# Patient Record
Sex: Male | Born: 1955 | Race: Black or African American | Hispanic: No | Marital: Single | Smoking: Current every day smoker
Health system: Southern US, Community
[De-identification: ages and names within clinical notes are randomized; demographics above are authoritative.]

## PROBLEM LIST (undated history)

## (undated) DIAGNOSIS — J45909 Unspecified asthma, uncomplicated: Secondary | ICD-10-CM

## (undated) DIAGNOSIS — F329 Major depressive disorder, single episode, unspecified: Secondary | ICD-10-CM

## (undated) DIAGNOSIS — I499 Cardiac arrhythmia, unspecified: Secondary | ICD-10-CM

## (undated) DIAGNOSIS — E119 Type 2 diabetes mellitus without complications: Secondary | ICD-10-CM

## (undated) DIAGNOSIS — I209 Angina pectoris, unspecified: Secondary | ICD-10-CM

## (undated) DIAGNOSIS — R011 Cardiac murmur, unspecified: Secondary | ICD-10-CM

## (undated) DIAGNOSIS — K219 Gastro-esophageal reflux disease without esophagitis: Secondary | ICD-10-CM

## (undated) DIAGNOSIS — M549 Dorsalgia, unspecified: Secondary | ICD-10-CM

## (undated) DIAGNOSIS — J189 Pneumonia, unspecified organism: Secondary | ICD-10-CM

## (undated) DIAGNOSIS — F419 Anxiety disorder, unspecified: Secondary | ICD-10-CM

## (undated) DIAGNOSIS — G8929 Other chronic pain: Secondary | ICD-10-CM

## (undated) DIAGNOSIS — F32A Depression, unspecified: Secondary | ICD-10-CM

## (undated) HISTORY — PX: EYE SURGERY: SHX253

## (undated) HISTORY — PX: TESTICLE SURGERY: SHX794

## (undated) HISTORY — PX: TONSILLECTOMY: SUR1361

---

## 2012-11-22 ENCOUNTER — Encounter (HOSPITAL_COMMUNITY): Payer: Self-pay | Admitting: Emergency Medicine

## 2012-11-22 ENCOUNTER — Emergency Department (HOSPITAL_COMMUNITY)
Admission: EM | Admit: 2012-11-22 | Discharge: 2012-11-22 | Disposition: A | Discharge status: Home / Self Care | Payer: Medicare Other | Attending: Emergency Medicine | Admitting: Emergency Medicine

## 2012-11-22 ENCOUNTER — Emergency Department (HOSPITAL_COMMUNITY): Payer: Medicare Other

## 2012-11-22 DIAGNOSIS — F17200 Nicotine dependence, unspecified, uncomplicated: Secondary | ICD-10-CM | POA: Insufficient documentation

## 2012-11-22 DIAGNOSIS — R51 Headache: Secondary | ICD-10-CM | POA: Insufficient documentation

## 2012-11-22 DIAGNOSIS — E119 Type 2 diabetes mellitus without complications: Secondary | ICD-10-CM | POA: Insufficient documentation

## 2012-11-22 DIAGNOSIS — R079 Chest pain, unspecified: Secondary | ICD-10-CM | POA: Insufficient documentation

## 2012-11-22 DIAGNOSIS — R0602 Shortness of breath: Secondary | ICD-10-CM | POA: Insufficient documentation

## 2012-11-22 HISTORY — DX: Type 2 diabetes mellitus without complications: E11.9

## 2012-11-22 LAB — BASIC METABOLIC PANEL
BUN: 15 mg/dL (ref 6–23)
CO2: 26 mEq/L (ref 19–32)
Calcium: 9.4 mg/dL (ref 8.4–10.5)
Chloride: 100 mEq/L (ref 96–112)
Creatinine, Ser: 1.01 mg/dL (ref 0.50–1.35)
GFR calc Af Amer: >90 mL/min (ref >90)
GFR calc non Af Amer: 81 mL/min — ABNORMAL LOW (ref >90)
Glucose, Bld: 272 mg/dL — ABNORMAL HIGH (ref 70–99)
Potassium: 4.2 mEq/L (ref 3.5–5.1)
Sodium: 136 mEq/L (ref 135–145)

## 2012-11-22 LAB — CBC
HCT: 43.1 % (ref 39.0–52.0)
Hemoglobin: 15.9 g/dL (ref 13.0–17.0)
MCH: 28.2 pg (ref 26.0–34.0)
MCHC: 36.9 g/dL — ABNORMAL HIGH (ref 30.0–36.0)
MCV: 76.6 fL — ABNORMAL LOW (ref 78.0–100.0)
Platelets: 146 10*3/uL — ABNORMAL LOW (ref 150–400)
RBC: 5.63 MIL/uL (ref 4.22–5.81)
RDW: 13.4 % (ref 11.5–15.5)
WBC: 7.5 10*3/uL (ref 4.0–10.5)

## 2012-11-22 LAB — POCT I-STAT TROPONIN I
Troponin i, poc: 0.01 ng/mL (ref 0.00–0.08)
Troponin i, poc: 0.02 ng/mL (ref 0.00–0.08)

## 2012-11-22 LAB — PRO B NATRIURETIC PEPTIDE: Pro B Natriuretic peptide (BNP): 11.3 pg/mL (ref 0–125)

## 2012-11-22 NOTE — ED Provider Notes (Signed)
  Face-to-face evaluation   History:  He complains of chest and headache for 2 days. It is intermittent. He has had previous episodes like this, and they were evaluated with stress test. He, states that he has had 2 negative stress tests in the last 5 years. He admits to stress at home regarding his 3 stepchildren. He does not currently have a PCP here. He recently moved to [place].  Physical exam: Obese, alert, calm, cooperative. He is not in any distress. There is no respiratory distress. Neurologic exam is grossly nonfocal.  Medical screening examination/treatment/procedure(s) were conducted as a shared visit with non-physician practitioner(s) and myself.  I personally evaluated the patient during the encounter  Flint Melter, MD 11/23/12 [phone]

## 2012-11-22 NOTE — ED Notes (Signed)
The pt has had some rt sided chest pain for 24 hours with dizziness son none now. Nausea.  He took cialis 1000 am yesterday

## 2012-11-22 NOTE — ED Provider Notes (Signed)
CSN: [id number]     Arrival date & time 11/22/12  1730 History   First MD Initiated Contact with Patient 11/22/12 1855     Chief Complaint  Patient presents with  . Chest Pain   (Consider location/radiation/quality/duration/timing/severity/associated sxs/prior Treatment) HPI Comments: Patient presents emergency department with chief complaint of chest pain that started last night. He reports associated headache and shortness of breath. States that he has diabetes, and smokes. He has had chest pain in the past, and has not be admitted before in [facility], but has never had a heart attack. He states that his pain has subsided after receiving aspirin by EMS. States that the chest pain and shortness of breath is worsened when he becomes angry. He denies any other symptoms at this time. No nausea, vomiting, diarrhea, or constipation. Took cialis yesterday at 10:00 am.  The history is provided by the patient. No language interpreter was used.    Past Medical History  Diagnosis Date  . Diabetes mellitus without complication    History reviewed. No pertinent past surgical history. No family history on file. History  Substance Use Topics  . Smoking status: Current Every Day Smoker  . Smokeless tobacco: Not on file  . Alcohol Use: No    Review of Systems  All other systems reviewed and are negative.    Allergies  Review of patient's allergies indicates no known allergies.  Home Medications  No current outpatient prescriptions on file. BP 142/79  Pulse 87  Temp(Src) 99 F (37.2 C) (Oral)  Resp 18  Ht 6\' 1"  (1.854 m)  Wt 315 lb (142.883 kg)  BMI 41.57 kg/m2  SpO2 96% Physical Exam  Nursing note and vitals reviewed. Constitutional: He is oriented to person, place, and time. He appears well-developed and well-nourished.  HENT:  Head: Normocephalic and atraumatic.  Right Ear: External ear normal.  Left Ear: External ear normal.  Nose: Nose normal.  Mouth/Throat: Oropharynx is  clear and moist. No oropharyngeal exudate.  Eyes: Conjunctivae and EOM are normal. Pupils are equal, round, and reactive to light. Right eye exhibits no discharge. Left eye exhibits no discharge. No scleral icterus.  Neck: Normal range of motion. Neck supple. No JVD present.  Cardiovascular: Normal rate, regular rhythm, normal heart sounds and intact distal pulses.  Exam reveals no gallop and no friction rub.   No murmur heard. Pulmonary/Chest: Effort normal and breath sounds normal. No respiratory distress. He has no wheezes. He has no rales. He exhibits no tenderness.  Abdominal: Soft. He exhibits no distension and no mass. There is no tenderness. There is no rebound and no guarding.  Musculoskeletal: Normal range of motion. He exhibits no edema and no tenderness.  Neurological: He is alert and oriented to person, place, and time. He has normal reflexes.  CN 3-12 intact  Skin: Skin is warm and dry.  Psychiatric: He has a normal mood and affect. His behavior is normal. Judgment and thought content normal.    ED Course  Procedures (including critical care time) Results for orders placed during the hospital encounter of 11/22/12  CBC      Result Value Range   WBC 7.5  4.0 - 10.5 K/uL   RBC 5.63  4.22 - 5.81 MIL/uL   Hemoglobin 15.9  13.0 - 17.0 g/dL   HCT 30.8  65.7 - 84.6 %   MCV 76.6 (*) 78.0 - 100.0 fL   MCH 28.2  26.0 - 34.0 pg   MCHC 36.9 (*) 30.0 -  36.0 g/dL   RDW 45.4  09.8 - 11.9 %   Platelets 146 (*) 150 - 400 K/uL  BASIC METABOLIC PANEL      Result Value Range   Sodium 136  135 - 145 mEq/L   Potassium 4.2  3.5 - 5.1 mEq/L   Chloride 100  96 - 112 mEq/L   CO2 26  19 - 32 mEq/L   Glucose, Bld 272 (*) 70 - 99 mg/dL   BUN 15  6 - 23 mg/dL   Creatinine, Ser 1.47  0.50 - 1.35 mg/dL   Calcium 9.4  8.4 - 82.9 mg/dL   GFR calc non Af Amer 81 (*) >90 mL/min   GFR calc Af Amer >90  >90 mL/min  PRO B NATRIURETIC PEPTIDE      Result Value Range   Pro B Natriuretic peptide (BNP)  11.3  0 - 125 pg/mL  POCT I-STAT TROPONIN I      Result Value Range   Troponin i, poc 0.01  0.00 - 0.08 ng/mL   Comment 3           POCT I-STAT TROPONIN I      Result Value Range   Troponin i, poc 0.02  0.00 - 0.08 ng/mL   Comment 3            Dg Chest 2 View  11/22/2012   CLINICAL DATA:  Chest pain  EXAM: CHEST  2 VIEW  COMPARISON:  None.  FINDINGS: The cardiac and mediastinal silhouettes are within normal limits.  The lungs are normally inflated. No airspace consolidation, pleural effusion, or pulmonary edema is identified. There is no pneumothorax.  No acute osseous abnormality identified. Multilevel degenerative changes are noted within the visualized spine.  IMPRESSION: No active cardiopulmonary disease.   Electronically Signed   By: Rise Mu M.D.   On: 11/22/2012 20:30      EKG Interpretation     Ventricular Rate:  90 PR Interval:  136 QRS Duration: 136 QT Interval:  368 QTC Calculation: 450 R Axis:   23 Text Interpretation:  Normal sinus rhythm Indeterminate axis Right bundle branch block Abnormal ECG No old tracing to compare            MDM   1. Chest pain     Patient with intermittent chest pain.  HEART score is 2.  Low risk to go home.  Trop x2 is negative.    Patient seen by and discussed with Dr. Effie Shy, who agrees that the patient can be discharged to home.  Patient is stable and ready for discharge.  Will have patient follow up [facility].    Roxy Horseman, PA-C 11/22/12 2255

## 2012-11-22 NOTE — ED Notes (Signed)
ems brought the pt in and they gave the pt 324mg  aspirin.  Iv per ems

## 2012-11-22 NOTE — ED Notes (Signed)
No answer in waiting area.

## 2012-11-22 NOTE — ED Notes (Signed)
Was able to draw i stat but was not able to draw rest of labs due to pt moving and pulling needle out of hand. Pt stated that I only had that one time and that was it. Pt placed in waiting room.

## 2013-01-09 DIAGNOSIS — M7989 Other specified soft tissue disorders: Secondary | ICD-10-CM

## 2013-01-10 ENCOUNTER — Emergency Department (HOSPITAL_COMMUNITY)
Admission: EM | Admit: 2013-01-10 | Discharge: 2013-01-10 | Disposition: A | Discharge status: Home / Self Care | Payer: Medicare Other | Attending: Emergency Medicine | Admitting: Emergency Medicine

## 2013-01-10 ENCOUNTER — Emergency Department (HOSPITAL_COMMUNITY): Payer: Medicare Other

## 2013-01-10 ENCOUNTER — Encounter (HOSPITAL_COMMUNITY): Payer: Self-pay | Admitting: Emergency Medicine

## 2013-01-10 DIAGNOSIS — Z79899 Other long term (current) drug therapy: Secondary | ICD-10-CM | POA: Insufficient documentation

## 2013-01-10 DIAGNOSIS — R059 Cough, unspecified: Secondary | ICD-10-CM | POA: Insufficient documentation

## 2013-01-10 DIAGNOSIS — R05 Cough: Secondary | ICD-10-CM | POA: Insufficient documentation

## 2013-01-10 DIAGNOSIS — K8050 Calculus of bile duct without cholangitis or cholecystitis without obstruction: Secondary | ICD-10-CM

## 2013-01-10 DIAGNOSIS — K8020 Calculus of gallbladder without cholecystitis without obstruction: Secondary | ICD-10-CM

## 2013-01-10 DIAGNOSIS — E669 Obesity, unspecified: Secondary | ICD-10-CM | POA: Insufficient documentation

## 2013-01-10 DIAGNOSIS — F17200 Nicotine dependence, unspecified, uncomplicated: Secondary | ICD-10-CM | POA: Insufficient documentation

## 2013-01-10 DIAGNOSIS — E119 Type 2 diabetes mellitus without complications: Secondary | ICD-10-CM | POA: Insufficient documentation

## 2013-01-10 LAB — COMPREHENSIVE METABOLIC PANEL
ALT: 30 U/L (ref 0–53)
AST: 21 U/L (ref 0–37)
Albumin: 3.3 g/dL — ABNORMAL LOW (ref 3.5–5.2)
Alkaline Phosphatase: 88 U/L (ref 39–117)
BUN: 12 mg/dL (ref 6–23)
CO2: 26 mEq/L (ref 19–32)
Calcium: 9.5 mg/dL (ref 8.4–10.5)
Chloride: 98 mEq/L (ref 96–112)
Creatinine, Ser: 0.89 mg/dL (ref 0.50–1.35)
GFR calc Af Amer: >90 mL/min (ref >90)
GFR calc non Af Amer: >90 mL/min (ref >90)
Glucose, Bld: 315 mg/dL — ABNORMAL HIGH (ref 70–99)
Potassium: 4.2 mEq/L (ref 3.5–5.1)
Sodium: 133 mEq/L — ABNORMAL LOW (ref 135–145)
Total Bilirubin: 0.2 mg/dL — ABNORMAL LOW (ref 0.3–1.2)
Total Protein: 6.8 g/dL (ref 6.0–8.3)

## 2013-01-10 LAB — CBC WITH DIFFERENTIAL/PLATELET
Basophils Absolute: 0 10*3/uL (ref 0.0–0.1)
Basophils Relative: 0 % (ref 0–1)
Eosinophils Absolute: 0.1 10*3/uL (ref 0.0–0.7)
Eosinophils Relative: 1 % (ref 0–5)
HCT: 41.6 % (ref 39.0–52.0)
Hemoglobin: 15 g/dL (ref 13.0–17.0)
Lymphocytes Relative: 41 % (ref 12–46)
Lymphs Abs: 2.5 10*3/uL (ref 0.7–4.0)
MCH: 27.4 pg (ref 26.0–34.0)
MCHC: 36.1 g/dL — ABNORMAL HIGH (ref 30.0–36.0)
MCV: 76.1 fL — ABNORMAL LOW (ref 78.0–100.0)
Monocytes Absolute: 0.4 10*3/uL (ref 0.1–1.0)
Monocytes Relative: 6 % (ref 3–12)
Neutro Abs: 3.1 10*3/uL (ref 1.7–7.7)
Neutrophils Relative %: 51 % (ref 43–77)
Platelets: 146 10*3/uL — ABNORMAL LOW (ref 150–400)
RBC: 5.47 MIL/uL (ref 4.22–5.81)
RDW: 13.1 % (ref 11.5–15.5)
WBC: 6 10*3/uL (ref 4.0–10.5)

## 2013-01-10 LAB — D-DIMER, QUANTITATIVE: D-Dimer, Quant: 0.48 ug/mL-FEU (ref 0.00–0.48)

## 2013-01-10 LAB — LIPASE, BLOOD: Lipase: 49 U/L (ref 11–59)

## 2013-01-10 LAB — URINALYSIS, ROUTINE W REFLEX MICROSCOPIC
Bilirubin Urine: NEGATIVE
Glucose, UA: >1000 mg/dL — AB
Hgb urine dipstick: NEGATIVE
Ketones, ur: NEGATIVE mg/dL
Leukocytes, UA: NEGATIVE
Nitrite: NEGATIVE
Protein, ur: NEGATIVE mg/dL
Specific Gravity, Urine: 1.025 (ref 1.005–1.030)
Urobilinogen, UA: 1 mg/dL (ref 0.0–1.0)
pH: 6 (ref 5.0–8.0)

## 2013-01-10 LAB — URINE MICROSCOPIC-ADD ON

## 2013-01-10 LAB — MAGNESIUM: Magnesium: 1.8 mg/dL (ref 1.5–2.5)

## 2013-01-10 LAB — TROPONIN I: Troponin I: <0.3 ng/mL (ref <0.30)

## 2013-01-10 MED ORDER — OXYCODONE-ACETAMINOPHEN 5-325 MG PO TABS: 1.0000 | ORAL_TABLET | Freq: Four times a day (QID) | ORAL | Status: DC | PRN

## 2013-01-10 NOTE — ED Provider Notes (Signed)
CSN: [id number]     Arrival date & time 01/10/13  4098 History   First MD Initiated Contact with Patient 01/10/13 0805     Chief Complaint  Patient presents with  . Abdominal Pain   (Consider location/radiation/quality/duration/timing/severity/associated sxs/prior Treatment) HPI Pt is a 57yo male with hx of DM c/o 5 day hx of RUQ pain and cough.  When asked to describe pain, aching, throbbing, sharp, pt stated "all of the above."  Pain is worse with coughing, sneezing or "pressure" Pain currently 3/10, but 10/10 at worst.  Has been taking flexeril and percocet which did provide "slight" relief yesterday.  Pt states cough has been intermittent and usually dry but occasionally coughs up small amounts of "black stuff."  Denies bright red blood. Pt is a smoking but states he is trying to cut back by using a vapor hooka.  Pt also mentions he is concerned he may have a blood clot as she did have a bruise to the back of R knee 1 week ago but it has not resolved.  Denies fever, n/v/d. Denies chest pain or SOB. Denies recent sick contacts or travel.  No hx of renal stones or pancreatitis. No hx of blood clots. No recent travel or sick contacts. Denies trauma. Denies hx of abdominal surgeries but does state his mother has hx of gallstones.   Past Medical History  Diagnosis Date  . Diabetes mellitus without complication    Past Surgical History  Procedure Laterality Date  . Tonsillectomy     History reviewed. No pertinent family history. History  Substance Use Topics  . Smoking status: Current Every Day Smoker -- 0.25 packs/day    Types: Cigarettes  . Smokeless tobacco: Never Used  . Alcohol Use: No    Review of Systems  Constitutional: Negative for fever, chills and appetite change.  HENT: Negative for congestion.   Respiratory: Positive for cough. Negative for shortness of breath.   Cardiovascular: Negative for chest pain.  Gastrointestinal: Positive for abdominal pain ( RUQ). Negative for  nausea, vomiting, diarrhea and constipation.  Genitourinary: Negative for dysuria, frequency and hematuria.  Musculoskeletal: Negative for back pain.  All other systems reviewed and are negative.    Allergies  Review of patient's allergies indicates no known allergies.  Home Medications   Current Outpatient Rx  Name  Route  Sig  Dispense  Refill  . esomeprazole (NEXIUM) 20 MG capsule   Oral   Take 20 mg by mouth daily as needed (for stomach / heartburn).         Marland Kitchen glipiZIDE (GLUCOTROL XL) 5 MG 24 hr tablet   Oral   Take 5 mg by mouth daily with breakfast.         . oxyCODONE-acetaminophen (PERCOCET/ROXICET) 5-325 MG per tablet   Oral   Take 1-2 tablets by mouth every 6 (six) hours as needed for moderate pain or severe pain.   10 tablet   0    BP 123/109  Pulse 78  Temp(Src) 97.9 F (36.6 C) (Oral)  Resp 20  SpO2 97% Physical Exam  Nursing note and vitals reviewed. Constitutional: He appears well-developed and well-nourished.  Obese male lying in exam bed, NAD.  HENT:  Head: Normocephalic and atraumatic.  Eyes: Conjunctivae are normal. No scleral icterus.  Neck: Normal range of motion.  Cardiovascular: Normal rate, regular rhythm and normal heart sounds.   Pulmonary/Chest: Effort normal and breath sounds normal. No respiratory distress. He has no wheezes. He has no rales. He  exhibits tenderness ( right lower anterior ribs).  No respiratory distress, able to speak in full sentences w/o difficulty. Lungs: CTAB  Abdominal: Soft. Bowel sounds are normal. He exhibits no distension and no mass. There is tenderness ( RUQ). There is guarding. There is no rebound and no CVA tenderness.    Obese abdomen, soft, tenderness in RUQ with some guarding.  Musculoskeletal: Normal range of motion.  Neurological: He is alert.  Skin: Skin is warm and dry. No rash noted. No erythema.    ED Course  Procedures (including critical care time) Labs Review Labs Reviewed  CBC WITH  DIFFERENTIAL - Abnormal; Notable for the following:    MCV 76.1 (*)    MCHC 36.1 (*)    Platelets 146 (*)    All other components within normal limits  COMPREHENSIVE METABOLIC PANEL - Abnormal; Notable for the following:    Sodium 133 (*)    Glucose, Bld 315 (*)    Albumin 3.3 (*)    Total Bilirubin 0.2 (*)    All other components within normal limits  URINALYSIS, ROUTINE W REFLEX MICROSCOPIC - Abnormal; Notable for the following:    Glucose, UA >1000 (*)    All other components within normal limits  URINE MICROSCOPIC-ADD ON - Abnormal; Notable for the following:    Squamous Epithelial / LPF FEW (*)    All other components within normal limits  LIPASE, BLOOD  D-DIMER, QUANTITATIVE  TROPONIN I  MAGNESIUM   Imaging Review Dg Chest 2 View  01/10/2013   CLINICAL DATA:  Right lower chest pain.  EXAM: CHEST  2 VIEW  COMPARISON:  11/22/2012  FINDINGS: The lungs are clear without focal infiltrate, edema, pneumothorax or pleural effusion. Interstitial markings are diffusely coarsened with chronic features. Cardiopericardial silhouette is at upper limits of normal for size. Imaged bony structures of the thorax are intact. Telemetry leads overlie the chest.  IMPRESSION: Stable.  No acute cardiopulmonary findings.   Electronically Signed   By: Kennith Center M.D.   On: 01/10/2013 09:24    EKG Interpretation    Date/Time:  Wednesday January 10 2013 08:13:44 EST Ventricular Rate:  90 PR Interval:  149 QRS Duration: 140 QT Interval:  375 QTC Calculation: 459 R Axis:   -57 Text Interpretation:  Sinus rhythm RBBB and LAFB No significant change was found Confirmed by Manus Gunning  MD, STEPHEN ([id number]) on 01/10/2013 8:18:35 AM            MDM   1. Biliary colic   2. Cholelithiasis    Pt is an obese male with hx of DM c/o right lower rib pain/RUQ pain that started 5 days ago. DDx: cholecystitis, pneumonia, PE (low suspicion), ACS (low suspicion).  Pt states his mother had gallstones and he is  concerned for PE due to having bruise behind right knee last week. Pt appears well, vitals: unremarkable.  Pt declined pain and nausea medication upon arrival.  On exam:  NAD, no respiratory distress, chest tenderness over right lower ribs, Abd-obese, soft, tender in RUQ with guarding.  No erythema or ecchymosis.  Discussed pt with Dr. Manus Gunning who also examined pt.  Will get labs: CBC, CMP, Lipase, Troponin, D-dimer, and UA.  CXR and abdominal U/S  9:46am: pt had a run of 8 beats of Vtach, pt was asymptomatic at the time. Nurse checked on pt who states he was bending down to put his shorts back on, NAD.  Will add magnesium lab to orders.  Abd U/S: gallbladder filled  with echogenic sludge as well as a stone, 2cm in diameter. No gallbladder wall thickening.  Mild tenderness over gallbladder, no classic Murphy's sign.    Pt has symptomatic cholelithiasis. Will provide contact information for f/u with [facility] for consultation of biliary colic.   All labs/imaging/findings discussed with patient. All questions answered and concerns addressed. Will discharge pt home and have pt f/u with [facility] info provided. Return precautions given. Pt verbalized understanding and agreement with tx plan. Vitals: unremarkable. Discharged in stable condition.    Discussed pt with Dr. Manus Gunning during ED encounter and agrees with plan.     Junius Finner, PA-C 01/10/13 [phone]

## 2013-01-10 NOTE — ED Notes (Signed)
Off floor for testing 

## 2013-01-10 NOTE — ED Notes (Signed)
Patient had a run of V-Tach-patient was bending over picking up pants-PA notified-patient voicing no complaints-will continue to monitor

## 2013-01-10 NOTE — ED Provider Notes (Signed)
Medical screening examination/treatment/procedure(s) were conducted as a shared visit with non-physician practitioner(s) and myself.  I personally evaluated the patient during the encounter.  5 day history of RUQ and lower rib pain, worse with coughing and movement and eating. No SOB or Vomiting TTP RUQ.  CTAB, RRR EKG unchanged.  Troponin negative, d-dimer negative. Gallstones on [place].   Reported "V tach" on monitor appears to be artifact as patient was standing up and putting pants on and asymptomatic.  EKG Interpretation    Date/Time:  Wednesday January 10 2013 08:13:44 EST Ventricular Rate:  90 PR Interval:  149 QRS Duration: 140 QT Interval:  375 QTC Calculation: 459 R Axis:   -57 Text Interpretation:  Sinus rhythm RBBB and LAFB No significant change was found Confirmed by Manus Gunning  MD, Daylan Juhnke ([id number]) on 01/10/2013 8:18:35 AM             Glynn Octave, MD 01/10/13 4401

## 2013-01-10 NOTE — ED Notes (Addendum)
Pt c/o RUQ pain x 5 days.  Pain score 9/10, increased with coughing.  Denies n/v/d.  Pt sts "I've taken old pain medication and muscle relaxers w/o relief."  Pt sts he had a bruise to the back of R knee x 1 week ago that has healed, but he's worried that it caused a blood clot.

## 2013-01-10 NOTE — Progress Notes (Signed)
Right lower extremity venous duplex completed.  Right:  No evidence of DVT, superficial thrombosis, or Baker's cyst.  Left:  Negative for DVT in the common femoral vein.  

## 2013-01-23 DIAGNOSIS — K8020 Calculus of gallbladder without cholecystitis without obstruction: Secondary | ICD-10-CM | POA: Diagnosis not present

## 2013-03-07 ENCOUNTER — Encounter (HOSPITAL_COMMUNITY): Payer: Self-pay | Admitting: Emergency Medicine

## 2013-03-07 ENCOUNTER — Emergency Department (HOSPITAL_COMMUNITY)
Admission: EM | Admit: 2013-03-07 | Discharge: 2013-03-07 | Disposition: A | Discharge status: Home / Self Care | Payer: Medicare Other | Attending: Emergency Medicine | Admitting: Emergency Medicine

## 2013-03-07 ENCOUNTER — Emergency Department (HOSPITAL_COMMUNITY): Payer: Medicare Other

## 2013-03-07 DIAGNOSIS — K7689 Other specified diseases of liver: Secondary | ICD-10-CM | POA: Diagnosis not present

## 2013-03-07 DIAGNOSIS — F17200 Nicotine dependence, unspecified, uncomplicated: Secondary | ICD-10-CM | POA: Insufficient documentation

## 2013-03-07 DIAGNOSIS — R5381 Other malaise: Secondary | ICD-10-CM | POA: Insufficient documentation

## 2013-03-07 DIAGNOSIS — E669 Obesity, unspecified: Secondary | ICD-10-CM | POA: Insufficient documentation

## 2013-03-07 DIAGNOSIS — E119 Type 2 diabetes mellitus without complications: Secondary | ICD-10-CM | POA: Insufficient documentation

## 2013-03-07 DIAGNOSIS — K8050 Calculus of bile duct without cholangitis or cholecystitis without obstruction: Secondary | ICD-10-CM

## 2013-03-07 DIAGNOSIS — R5383 Other fatigue: Secondary | ICD-10-CM

## 2013-03-07 DIAGNOSIS — R059 Cough, unspecified: Secondary | ICD-10-CM | POA: Insufficient documentation

## 2013-03-07 DIAGNOSIS — R05 Cough: Secondary | ICD-10-CM | POA: Insufficient documentation

## 2013-03-07 DIAGNOSIS — Z79899 Other long term (current) drug therapy: Secondary | ICD-10-CM | POA: Insufficient documentation

## 2013-03-07 DIAGNOSIS — K8020 Calculus of gallbladder without cholecystitis without obstruction: Secondary | ICD-10-CM | POA: Insufficient documentation

## 2013-03-07 LAB — URINALYSIS, ROUTINE W REFLEX MICROSCOPIC
Bilirubin Urine: NEGATIVE
Glucose, UA: >1000 mg/dL — AB
Hgb urine dipstick: NEGATIVE
Ketones, ur: NEGATIVE mg/dL
Leukocytes, UA: NEGATIVE
Nitrite: NEGATIVE
Protein, ur: NEGATIVE mg/dL
Specific Gravity, Urine: 1.027 (ref 1.005–1.030)
Urobilinogen, UA: 1 mg/dL (ref 0.0–1.0)
pH: 5.5 (ref 5.0–8.0)

## 2013-03-07 LAB — URINE MICROSCOPIC-ADD ON

## 2013-03-07 LAB — COMPREHENSIVE METABOLIC PANEL
ALT: 24 U/L (ref 0–53)
AST: 13 U/L (ref 0–37)
Albumin: 3.5 g/dL (ref 3.5–5.2)
Alkaline Phosphatase: 89 U/L (ref 39–117)
BUN: 9 mg/dL (ref 6–23)
CO2: 25 mEq/L (ref 19–32)
Calcium: 9.3 mg/dL (ref 8.4–10.5)
Chloride: 99 mEq/L (ref 96–112)
Creatinine, Ser: 0.8 mg/dL (ref 0.50–1.35)
GFR calc Af Amer: >90 mL/min (ref >90)
GFR calc non Af Amer: >90 mL/min (ref >90)
Glucose, Bld: 302 mg/dL — ABNORMAL HIGH (ref 70–99)
Potassium: 4.4 mEq/L (ref 3.7–5.3)
Sodium: 135 mEq/L — ABNORMAL LOW (ref 137–147)
Total Bilirubin: 0.3 mg/dL (ref 0.3–1.2)
Total Protein: 6.9 g/dL (ref 6.0–8.3)

## 2013-03-07 LAB — CBC WITH DIFFERENTIAL/PLATELET
Basophils Absolute: 0 10*3/uL (ref 0.0–0.1)
Basophils Relative: 0 % (ref 0–1)
Eosinophils Absolute: 0.1 10*3/uL (ref 0.0–0.7)
Eosinophils Relative: 1 % (ref 0–5)
HCT: 42.9 % (ref 39.0–52.0)
Hemoglobin: 15.3 g/dL (ref 13.0–17.0)
Lymphocytes Relative: 46 % (ref 12–46)
Lymphs Abs: 3.1 10*3/uL (ref 0.7–4.0)
MCH: 27 pg (ref 26.0–34.0)
MCHC: 35.7 g/dL (ref 30.0–36.0)
MCV: 75.7 fL — ABNORMAL LOW (ref 78.0–100.0)
Monocytes Absolute: 0.4 10*3/uL (ref 0.1–1.0)
Monocytes Relative: 6 % (ref 3–12)
Neutro Abs: 3.1 10*3/uL (ref 1.7–7.7)
Neutrophils Relative %: 47 % (ref 43–77)
Platelets: 142 10*3/uL — ABNORMAL LOW (ref 150–400)
RBC: 5.67 MIL/uL (ref 4.22–5.81)
RDW: 13.3 % (ref 11.5–15.5)
WBC: 6.7 10*3/uL (ref 4.0–10.5)

## 2013-03-07 LAB — LIPASE, BLOOD: Lipase: 43 U/L (ref 11–59)

## 2013-03-07 MED ORDER — HYDROMORPHONE HCL 2 MG PO TABS: 2.0000 mg | ORAL_TABLET | ORAL | Status: DC | PRN

## 2013-03-07 MED ORDER — HYDROMORPHONE HCL PF 1 MG/ML IJ SOLN
1.0000 mg | Freq: Once | INTRAMUSCULAR | Status: AC
  Administered 2013-03-07: 1 mg via INTRAVENOUS
  Filled 2013-03-07: qty 1

## 2013-03-07 MED ORDER — ONDANSETRON HCL 4 MG/2ML IJ SOLN
4.0000 mg | Freq: Once | INTRAMUSCULAR | Status: AC
  Administered 2013-03-07: 4 mg via INTRAVENOUS
  Filled 2013-03-07: qty 2

## 2013-03-07 MED ORDER — SODIUM CHLORIDE 0.9 % IV BOLUS (SEPSIS)
1000.0000 mL | Freq: Once | INTRAVENOUS | Status: AC
  Administered 2013-03-07: 1000 mL via INTRAVENOUS

## 2013-03-07 NOTE — Discharge Instructions (Signed)
Abdominal Pain, Adult °Many things can cause abdominal pain. Usually, abdominal pain is not caused by a disease and will improve without treatment. It can often be observed and treated at home. Your health care provider will do a physical exam and possibly order blood tests and X-rays to help determine the seriousness of your pain. However, in many cases, more time must pass before a clear cause of the pain can be found. Before that point, your health care provider may not know if you need more testing or further treatment. °HOME CARE INSTRUCTIONS  °Monitor your abdominal pain for any changes. The following actions may help to alleviate any discomfort you are experiencing: °· Only take over-the-counter or prescription medicines as directed by your health care provider. °· Do not take laxatives unless directed to do so by your health care provider. °· Try a clear liquid diet (broth, tea, or water) as directed by your health care provider. Slowly move to a bland diet as tolerated. °SEEK MEDICAL CARE IF: °· You have unexplained abdominal pain. °· You have abdominal pain associated with nausea or diarrhea. °· You have pain when you urinate or have a bowel movement. °· You experience abdominal pain that wakes you in the night. °· You have abdominal pain that is worsened or improved by eating food. °· You have abdominal pain that is worsened with eating fatty foods. °SEEK IMMEDIATE MEDICAL CARE IF:  °· Your pain does not go away within 2 hours. °· You have a fever. °· You keep throwing up (vomiting). °· Your pain is felt only in portions of the abdomen, such as the right side or the left lower portion of the abdomen. °· You pass bloody or black tarry stools. °MAKE SURE YOU: °· Understand these instructions.   °· Will watch your condition.   °· Will get help right away if you are not doing well or get worse.   °Document Released: 10/14/2004 Document Revised: 10/25/2012 Document Reviewed: 09/13/2012 °ExitCare® Patient  Information ©2014 [employer]. ° °Biliary Colic  °Biliary colic is a steady or irregular pain in the upper abdomen. It is usually under the right side of the rib cage. It happens when gallstones interfere with the normal flow of bile from the gallbladder. Bile is a liquid that helps to digest fats. Bile is made in the liver and stored in the gallbladder. When you eat a meal, bile passes from the gallbladder through the cystic duct and the common bile duct into the small intestine. There, it mixes with partially digested food. If a gallstone blocks either of these ducts, the normal flow of bile is blocked. The muscle cells in the bile duct contract forcefully to try to move the stone. This causes the pain of biliary colic.  °SYMPTOMS  °· A person with biliary colic usually complains of pain in the upper abdomen. This pain can be: °· In the center of the upper abdomen just below the breastbone. °· In the upper-right part of the abdomen, near the gallbladder and liver. °· Spread back toward the right shoulder blade. °· Nausea and vomiting. °· The pain usually occurs after eating. °· Biliary colic is usually triggered by the digestive system's demand for bile. The demand for bile is high after fatty meals. Symptoms can also occur when a person who has been fasting suddenly eats a very large meal. Most episodes of biliary colic pass after 1 to 5 hours. After the most intense pain passes, your abdomen may continue to ache mildly for about   24 hours. °DIAGNOSIS  °After you describe your symptoms, your caregiver will perform a physical exam. He or she will pay attention to the upper right portion of your belly (abdomen). This is the area of your liver and gallbladder. An ultrasound will help your caregiver look for gallstones. Specialized scans of the gallbladder may also be done. Blood tests may be done, especially if you have fever or if your pain persists. °PREVENTION  °Biliary colic can be prevented by controlling the  risk factors for gallstones. Some of these risk factors, such as heredity, increasing age, and pregnancy are a normal part of life. Obesity and a high-fat diet are risk factors you can change through a healthy lifestyle. Women going through menopause who take hormone replacement therapy (estrogen) are also more likely to develop biliary colic. °TREATMENT  °· Pain medication may be prescribed. °· You may be encouraged to eat a fat-free diet. °· If the first episode of biliary colic is severe, or episodes of colic keep retuning, surgery to remove the gallbladder (cholecystectomy) is usually recommended. This procedure can be done through small incisions using an instrument called a laparoscope. The procedure often requires a brief stay in the hospital. Some people can leave the hospital the same day. It is the most widely used treatment in people troubled by painful gallstones. It is effective and safe, with no complications in more than 90% of cases. °· If surgery cannot be done, medication that dissolves gallstones may be used. This medication is expensive and can take months or years to work. Only small stones will dissolve. °· Rarely, medication to dissolve gallstones is combined with a procedure called shock-wave lithotripsy. This procedure uses carefully aimed shock waves to break up gallstones. In many people treated with this procedure, gallstones form again within a few years. °PROGNOSIS  °If gallstones block your cystic duct or common bile duct, you are at risk for repeated episodes of biliary colic. There is also a 25% chance that you will develop a gallbladder infection(acute cholecystitis), or some other complication of gallstones within 10 to 20 years. If you have surgery, schedule it at a time that is convenient for you and at a time when you are not sick. °HOME CARE INSTRUCTIONS  °· Drink plenty of clear fluids. °· Avoid fatty, greasy or fried foods, or any foods that make your pain worse. °· Take  medications as directed. °SEEK MEDICAL CARE IF:  °· You develop a fever over 100.5° F (38.1° C). °· Your pain gets worse over time. °· You develop nausea that prevents you from eating and drinking. °· You develop vomiting. °SEEK IMMEDIATE MEDICAL CARE IF:  °· You have continuous or severe belly (abdominal) pain which is not relieved with medications. °· You develop nausea and vomiting which is not relieved with medications. °· You have symptoms of biliary colic and you suddenly develop a fever and shaking chills. This may signal cholecystitis. Call your caregiver immediately. °· You develop a yellow color to your skin or the white part of your eyes (jaundice). °Document Released: 06/07/2005 Document Revised: 03/29/2011 Document Reviewed: 08/17/2007 °ExitCare® Patient Information ©2014 [employer]. ° °

## 2013-03-07 NOTE — ED Notes (Signed)
Pt c/o bilateral flank pain; nausea; pain upper right quad; vomiting; weakness; symptoms x 2 days

## 2013-03-07 NOTE — ED Provider Notes (Addendum)
CSN: [id number]     Arrival date & time 03/07/13  1036 History   First MD Initiated Contact with Patient 03/07/13 1118     Chief Complaint  Patient presents with  . Flank Pain     (Consider location/radiation/quality/duration/timing/severity/associated sxs/prior Treatment) HPI Comments: 58 year old male presents with 2 days of right upper quadrant and bilateral "kidney pain". Patient states that this kidney started hurting about a week ago but it started getting better and then now he started to have worse pain in his back and his right upper quadrant. This is similar to when he was here a month or 2 ago. Denies any fevers at home. Has had nausea and vomiting. Last vomited yesterday. Denies dysuria or hematuria. No prior history of kidney stones. He was diagnosed with biliary colic ultrasound was last ED visit but has been unable to follow up with a surgeon. He states that the next appointment is next month. He is trying to get with his primary doctor to try to get a different surgeon to followup with. He states he had leftover but not expired 2 mg dilaudid tablets and took one with some relief at home.   Past Medical History  Diagnosis Date  . Diabetes mellitus without complication    Past Surgical History  Procedure Laterality Date  . Tonsillectomy     No family history on file. History  Substance Use Topics  . Smoking status: Current Every Day Smoker -- 0.25 packs/day    Types: Cigarettes  . Smokeless tobacco: Never Used  . Alcohol Use: No    Review of Systems  Constitutional: Positive for fatigue. Negative for fever.  Respiratory: Positive for cough (for several months). Negative for shortness of breath.   Gastrointestinal: Positive for nausea, vomiting and abdominal pain. Negative for diarrhea and constipation.  Genitourinary: Negative for dysuria and hematuria.  Musculoskeletal: Positive for back pain.  All other systems reviewed and are negative.      Allergies   Review of patient's allergies indicates no known allergies.  Home Medications   Current Outpatient Rx  Name  Route  Sig  Dispense  Refill  . glipiZIDE (GLUCOTROL XL) 5 MG 24 hr tablet   Oral   Take 5 mg by mouth daily with breakfast.         . HYDROmorphone (DILAUDID) 2 MG tablet   Oral   Take 1 mg by mouth every 4 (four) hours as needed for moderate pain or severe pain.         . metFORMIN (GLUCOPHAGE) 1000 MG tablet   Oral   Take 1,000 mg by mouth 2 (two) times daily with a meal.          BP 111/77  Pulse 92  Temp(Src) 98.1 F (36.7 C)  Resp 20  SpO2 96% Physical Exam  Nursing note and vitals reviewed. Constitutional: He is oriented to person, place, and time. He appears well-developed and well-nourished. No distress.  obese  HENT:  Head: Normocephalic and atraumatic.  Right Ear: External ear normal.  Left Ear: External ear normal.  Nose: Nose normal.  Eyes: Right eye exhibits no discharge. Left eye exhibits no discharge.  Neck: Neck supple.  Cardiovascular: Normal rate, regular rhythm, normal heart sounds and intact distal pulses.   Pulmonary/Chest: Effort normal and breath sounds normal. He has no wheezes. He has no rales.  Abdominal: Soft. There is tenderness in the right upper quadrant. There is CVA tenderness (mild, bilateral).  Musculoskeletal: He exhibits no edema.  Neurological: He is alert and oriented to person, place, and time.  Skin: Skin is warm and dry.    ED Course  Procedures (including critical care time) Labs Review Labs Reviewed  CBC WITH DIFFERENTIAL - Abnormal; Notable for the following:    MCV 75.7 (*)    Platelets 142 (*)    All other components within normal limits  COMPREHENSIVE METABOLIC PANEL - Abnormal; Notable for the following:    Sodium 135 (*)    Glucose, Bld 302 (*)    All other components within normal limits  URINALYSIS, ROUTINE W REFLEX MICROSCOPIC - Abnormal; Notable for the following:    Glucose, UA >1000 (*)     All other components within normal limits  LIPASE, BLOOD  URINE MICROSCOPIC-ADD ON   Imaging Review US Abdomen Complete  03/07/2013   CLINICAL DATA:  Upper abdominal pain  EXAM: ULTRASOUND ABDOMEN COMPLETE  COMPARISON:  January 10, 2013  FINDINGS: Gallbladder:  Within the gallbladder, there is a 2.9 cm echogenic focus which moves and shadows consistent with either one large or multiple conglomerated gallstones. There is also sludge in the gallbladder. The gallbladder wall is not thickened, and there is no pericholecystic fluid. Patient is mildly tender over the gallbladder.  Common bile duct:  Diameter: 6 mm. There is no intrahepatic, common hepatic, or common bile duct dilatation.  Liver:  No focal lesion identified. The echotexture of the liver is increased.  IVC:  No abnormality visualized.  Pancreas:  Visualized portion unremarkable. Portions of the pancreatic tail are obscured by gas.  Spleen:  Size and appearance within normal limits.  Right Kidney:  Length: 12.2 cm. Echogenicity within normal limits. No hydronephrosis visualized. There is a simple cyst measuring 1.6 x 1.7 x 1.7 cm in the mid right kidney. No other renal masses are identified.  Left Kidney:  Length: 11.9 cm. Echogenicity within normal limits. No mass or hydronephrosis visualized.  Abdominal aorta:  No aneurysm visualized.  Other findings:  No demonstrable ascites.  IMPRESSION: Cholelithiasis and sludge in gallbladder. Gallbladder wall is not thickened, and there is no pericholecystic fluid.  There is diffuse increased liver echogenicity consistent with fatty change. While no focal liver lesions are identified, it must be cautioned that the sensitivity of ultrasound for focal liver lesions is diminished given this degree of underlying fatty change.  Portions of the pancreatic tail are obscured by gas. Visualized portions of pancreas appear normal.  There is a simple cyst in the right kidney.   Electronically Signed   By: Lowella Grip M.D.   On: 03/07/2013 14:48    EKG Interpretation   None       MDM   Final diagnoses:  Cholelithiasis  Biliary colic    His back exam is unimpressive. Mild bilateral back pain but not quite CVA. No midline sx or tenderness. RUQ is more tender than back. With benign renal part of u/s, no hematuria or UTI I feel kidney pathology is unlikely. More likely MSK given how much it worsens with movement. Gallbladder shows stone w/o obvious cholecystitis. Given no fevers, good pain control in ED and normal WBC, I feel he is stable for outpatient f/u. Encouraged he needs either f/u with [facility] or a surgeon his primary care physician can refer him to. Discussed strict return precautions.     Ephraim Hamburger, MD 03/07/13 [place], MD 03/07/13 1535
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
## 2013-03-21 DIAGNOSIS — K8020 Calculus of gallbladder without cholecystitis without obstruction: Secondary | ICD-10-CM | POA: Diagnosis not present

## 2013-03-21 DIAGNOSIS — R109 Unspecified abdominal pain: Secondary | ICD-10-CM | POA: Diagnosis not present

## 2013-04-10 DIAGNOSIS — D739 Disease of spleen, unspecified: Secondary | ICD-10-CM | POA: Diagnosis not present

## 2013-04-10 DIAGNOSIS — D3500 Benign neoplasm of unspecified adrenal gland: Secondary | ICD-10-CM | POA: Diagnosis not present

## 2013-04-10 DIAGNOSIS — R1011 Right upper quadrant pain: Secondary | ICD-10-CM | POA: Diagnosis not present

## 2013-04-10 DIAGNOSIS — K828 Other specified diseases of gallbladder: Secondary | ICD-10-CM | POA: Diagnosis not present

## 2013-04-10 DIAGNOSIS — K8020 Calculus of gallbladder without cholecystitis without obstruction: Secondary | ICD-10-CM | POA: Diagnosis not present

## 2013-04-25 DIAGNOSIS — H3560 Retinal hemorrhage, unspecified eye: Secondary | ICD-10-CM | POA: Diagnosis not present

## 2013-04-25 DIAGNOSIS — H3520 Other non-diabetic proliferative retinopathy, unspecified eye: Secondary | ICD-10-CM | POA: Diagnosis not present

## 2013-04-25 DIAGNOSIS — H5210 Myopia, unspecified eye: Secondary | ICD-10-CM | POA: Diagnosis not present

## 2013-04-25 DIAGNOSIS — H35419 Lattice degeneration of retina, unspecified eye: Secondary | ICD-10-CM | POA: Diagnosis not present

## 2013-04-25 DIAGNOSIS — H3320 Serous retinal detachment, unspecified eye: Secondary | ICD-10-CM | POA: Diagnosis not present

## 2013-04-25 DIAGNOSIS — Z5189 Encounter for other specified aftercare: Secondary | ICD-10-CM | POA: Diagnosis not present

## 2013-04-25 DIAGNOSIS — H4420 Degenerative myopia, unspecified eye: Secondary | ICD-10-CM | POA: Diagnosis not present

## 2013-04-30 DIAGNOSIS — N508 Other specified disorders of male genital organs: Secondary | ICD-10-CM | POA: Diagnosis not present

## 2013-05-04 DIAGNOSIS — E7800 Pure hypercholesterolemia, unspecified: Secondary | ICD-10-CM | POA: Diagnosis not present

## 2013-05-04 DIAGNOSIS — I10 Essential (primary) hypertension: Secondary | ICD-10-CM | POA: Diagnosis not present

## 2013-05-04 DIAGNOSIS — J449 Chronic obstructive pulmonary disease, unspecified: Secondary | ICD-10-CM | POA: Diagnosis not present

## 2013-05-04 DIAGNOSIS — N433 Hydrocele, unspecified: Secondary | ICD-10-CM | POA: Diagnosis not present

## 2013-05-04 DIAGNOSIS — N508 Other specified disorders of male genital organs: Secondary | ICD-10-CM | POA: Diagnosis not present

## 2013-05-04 DIAGNOSIS — E669 Obesity, unspecified: Secondary | ICD-10-CM | POA: Diagnosis not present

## 2013-05-04 DIAGNOSIS — E119 Type 2 diabetes mellitus without complications: Secondary | ICD-10-CM | POA: Diagnosis not present

## 2013-05-04 DIAGNOSIS — K219 Gastro-esophageal reflux disease without esophagitis: Secondary | ICD-10-CM | POA: Diagnosis not present

## 2013-05-07 DIAGNOSIS — F17200 Nicotine dependence, unspecified, uncomplicated: Secondary | ICD-10-CM | POA: Diagnosis not present

## 2013-05-07 DIAGNOSIS — I2589 Other forms of chronic ischemic heart disease: Secondary | ICD-10-CM | POA: Diagnosis not present

## 2013-05-07 DIAGNOSIS — I472 Ventricular tachycardia: Secondary | ICD-10-CM | POA: Diagnosis not present

## 2013-05-07 DIAGNOSIS — E7800 Pure hypercholesterolemia, unspecified: Secondary | ICD-10-CM | POA: Diagnosis not present

## 2013-05-07 DIAGNOSIS — Z6841 Body Mass Index (BMI) 40.0 and over, adult: Secondary | ICD-10-CM | POA: Diagnosis not present

## 2013-05-07 DIAGNOSIS — K219 Gastro-esophageal reflux disease without esophagitis: Secondary | ICD-10-CM | POA: Diagnosis not present

## 2013-05-07 DIAGNOSIS — J449 Chronic obstructive pulmonary disease, unspecified: Secondary | ICD-10-CM | POA: Diagnosis not present

## 2013-05-07 DIAGNOSIS — Z9089 Acquired absence of other organs: Secondary | ICD-10-CM | POA: Diagnosis not present

## 2013-05-07 DIAGNOSIS — I4729 Other ventricular tachycardia: Secondary | ICD-10-CM | POA: Diagnosis not present

## 2013-05-07 DIAGNOSIS — I10 Essential (primary) hypertension: Secondary | ICD-10-CM | POA: Diagnosis not present

## 2013-05-07 DIAGNOSIS — I080 Rheumatic disorders of both mitral and aortic valves: Secondary | ICD-10-CM | POA: Diagnosis not present

## 2013-05-07 DIAGNOSIS — Z01818 Encounter for other preprocedural examination: Secondary | ICD-10-CM | POA: Diagnosis not present

## 2013-05-07 DIAGNOSIS — K8020 Calculus of gallbladder without cholecystitis without obstruction: Secondary | ICD-10-CM | POA: Diagnosis not present

## 2013-05-07 DIAGNOSIS — E119 Type 2 diabetes mellitus without complications: Secondary | ICD-10-CM | POA: Diagnosis not present

## 2013-05-11 DIAGNOSIS — J449 Chronic obstructive pulmonary disease, unspecified: Secondary | ICD-10-CM | POA: Diagnosis not present

## 2013-05-11 DIAGNOSIS — Z6841 Body Mass Index (BMI) 40.0 and over, adult: Secondary | ICD-10-CM | POA: Diagnosis not present

## 2013-05-11 DIAGNOSIS — E7800 Pure hypercholesterolemia, unspecified: Secondary | ICD-10-CM | POA: Diagnosis not present

## 2013-05-11 DIAGNOSIS — H539 Unspecified visual disturbance: Secondary | ICD-10-CM | POA: Diagnosis not present

## 2013-05-11 DIAGNOSIS — I10 Essential (primary) hypertension: Secondary | ICD-10-CM | POA: Diagnosis not present

## 2013-05-11 DIAGNOSIS — D2930 Benign neoplasm of unspecified epididymis: Secondary | ICD-10-CM | POA: Diagnosis not present

## 2013-05-11 DIAGNOSIS — N508 Other specified disorders of male genital organs: Secondary | ICD-10-CM | POA: Diagnosis not present

## 2013-05-11 DIAGNOSIS — K219 Gastro-esophageal reflux disease without esophagitis: Secondary | ICD-10-CM | POA: Diagnosis not present

## 2013-05-11 DIAGNOSIS — F17200 Nicotine dependence, unspecified, uncomplicated: Secondary | ICD-10-CM | POA: Diagnosis not present

## 2013-05-11 DIAGNOSIS — E119 Type 2 diabetes mellitus without complications: Secondary | ICD-10-CM | POA: Diagnosis not present

## 2013-06-06 DIAGNOSIS — Z130 Encounter for screening for diseases of the blood and blood-forming organs and certain disorders involving the immune mechanism: Secondary | ICD-10-CM | POA: Diagnosis not present

## 2013-06-06 DIAGNOSIS — M25569 Pain in unspecified knee: Secondary | ICD-10-CM | POA: Diagnosis not present

## 2013-06-06 DIAGNOSIS — IMO0001 Reserved for inherently not codable concepts without codable children: Secondary | ICD-10-CM | POA: Diagnosis not present

## 2013-08-28 ENCOUNTER — Emergency Department (HOSPITAL_COMMUNITY): Payer: Medicare Other

## 2013-08-28 ENCOUNTER — Emergency Department (HOSPITAL_COMMUNITY)
Admission: EM | Admit: 2013-08-28 | Discharge: 2013-08-29 | Disposition: A | Discharge status: Home / Self Care | Payer: Medicare Other | Attending: Emergency Medicine | Admitting: Emergency Medicine

## 2013-08-28 ENCOUNTER — Encounter (HOSPITAL_COMMUNITY): Payer: Self-pay | Admitting: Emergency Medicine

## 2013-08-28 DIAGNOSIS — S0990XA Unspecified injury of head, initial encounter: Secondary | ICD-10-CM | POA: Insufficient documentation

## 2013-08-28 DIAGNOSIS — S199XXA Unspecified injury of neck, initial encounter: Secondary | ICD-10-CM | POA: Diagnosis not present

## 2013-08-28 DIAGNOSIS — S1093XA Contusion of unspecified part of neck, initial encounter: Principal | ICD-10-CM

## 2013-08-28 DIAGNOSIS — S0093XA Contusion of unspecified part of head, initial encounter: Secondary | ICD-10-CM

## 2013-08-28 DIAGNOSIS — S0993XA Unspecified injury of face, initial encounter: Secondary | ICD-10-CM | POA: Diagnosis not present

## 2013-08-28 DIAGNOSIS — R6884 Jaw pain: Secondary | ICD-10-CM | POA: Diagnosis not present

## 2013-08-28 DIAGNOSIS — R51 Headache: Secondary | ICD-10-CM | POA: Diagnosis not present

## 2013-08-28 DIAGNOSIS — E119 Type 2 diabetes mellitus without complications: Secondary | ICD-10-CM | POA: Insufficient documentation

## 2013-08-28 DIAGNOSIS — Z79899 Other long term (current) drug therapy: Secondary | ICD-10-CM | POA: Diagnosis not present

## 2013-08-28 DIAGNOSIS — S0003XA Contusion of scalp, initial encounter: Secondary | ICD-10-CM | POA: Diagnosis not present

## 2013-08-28 DIAGNOSIS — T7411XA Adult physical abuse, confirmed, initial encounter: Secondary | ICD-10-CM | POA: Diagnosis not present

## 2013-08-28 DIAGNOSIS — F17200 Nicotine dependence, unspecified, uncomplicated: Secondary | ICD-10-CM | POA: Insufficient documentation

## 2013-08-28 DIAGNOSIS — S0083XA Contusion of other part of head, initial encounter: Principal | ICD-10-CM | POA: Insufficient documentation

## 2013-08-28 MED ORDER — KETOROLAC TROMETHAMINE 30 MG/ML IJ SOLN
30.0000 mg | Freq: Once | INTRAMUSCULAR | Status: AC
  Administered 2013-08-28: 30 mg via INTRAMUSCULAR
  Filled 2013-08-28: qty 1

## 2013-08-28 MED ORDER — HYDROCODONE-ACETAMINOPHEN 5-325 MG PO TABS: 1.0000 | ORAL_TABLET | Freq: Once | ORAL | Status: DC

## 2013-08-28 MED ORDER — MELOXICAM 15 MG PO TABS: 15.0000 mg | ORAL_TABLET | Freq: Every day | ORAL | Status: DC
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
## 2013-09-04 NOTE — ED Provider Notes (Signed)
Medical screening examination/treatment/procedure(s) were performed by non-physician practitioner and as supervising physician I was immediately available for consultation/collaboration.     Mirna Mires, MD 09/04/13 [phone]

## 2013-09-23 ENCOUNTER — Encounter (HOSPITAL_COMMUNITY): Payer: Self-pay | Admitting: Emergency Medicine

## 2013-09-23 ENCOUNTER — Emergency Department (HOSPITAL_COMMUNITY): Payer: Medicare Other

## 2013-09-23 ENCOUNTER — Emergency Department (HOSPITAL_COMMUNITY)
Admission: EM | Admit: 2013-09-23 | Discharge: 2013-09-24 | Disposition: A | Discharge status: Home / Self Care | Payer: Medicare Other | Attending: Emergency Medicine | Admitting: Emergency Medicine

## 2013-09-23 DIAGNOSIS — H5440 Blindness, one eye, unspecified eye: Secondary | ICD-10-CM | POA: Insufficient documentation

## 2013-09-23 DIAGNOSIS — S139XXA Sprain of joints and ligaments of unspecified parts of neck, initial encounter: Secondary | ICD-10-CM | POA: Diagnosis not present

## 2013-09-23 DIAGNOSIS — E119 Type 2 diabetes mellitus without complications: Secondary | ICD-10-CM | POA: Diagnosis not present

## 2013-09-23 DIAGNOSIS — F17200 Nicotine dependence, unspecified, uncomplicated: Secondary | ICD-10-CM | POA: Insufficient documentation

## 2013-09-23 DIAGNOSIS — S46909A Unspecified injury of unspecified muscle, fascia and tendon at shoulder and upper arm level, unspecified arm, initial encounter: Secondary | ICD-10-CM | POA: Insufficient documentation

## 2013-09-23 DIAGNOSIS — Y9389 Activity, other specified: Secondary | ICD-10-CM | POA: Diagnosis not present

## 2013-09-23 DIAGNOSIS — M542 Cervicalgia: Secondary | ICD-10-CM | POA: Diagnosis not present

## 2013-09-23 DIAGNOSIS — S4980XA Other specified injuries of shoulder and upper arm, unspecified arm, initial encounter: Secondary | ICD-10-CM | POA: Insufficient documentation

## 2013-09-23 DIAGNOSIS — S199XXA Unspecified injury of neck, initial encounter: Secondary | ICD-10-CM

## 2013-09-23 DIAGNOSIS — Y9289 Other specified places as the place of occurrence of the external cause: Secondary | ICD-10-CM | POA: Insufficient documentation

## 2013-09-23 DIAGNOSIS — Z79899 Other long term (current) drug therapy: Secondary | ICD-10-CM | POA: Diagnosis not present

## 2013-09-23 DIAGNOSIS — S0993XA Unspecified injury of face, initial encounter: Secondary | ICD-10-CM | POA: Diagnosis not present

## 2013-09-23 DIAGNOSIS — T148XXA Other injury of unspecified body region, initial encounter: Secondary | ICD-10-CM

## 2013-09-23 DIAGNOSIS — IMO0002 Reserved for concepts with insufficient information to code with codable children: Secondary | ICD-10-CM | POA: Diagnosis not present

## 2013-09-23 MED ORDER — CYCLOBENZAPRINE HCL 10 MG PO TABS: 10.0000 mg | ORAL_TABLET | Freq: Three times a day (TID) | ORAL | Status: DC | PRN

## 2013-09-23 MED ORDER — IBUPROFEN 800 MG PO TABS: 800.0000 mg | ORAL_TABLET | Freq: Three times a day (TID) | ORAL | Status: DC

## 2013-09-23 MED ORDER — IBUPROFEN 800 MG PO TABS
800.0000 mg | ORAL_TABLET | Freq: Once | ORAL | Status: DC
  Filled 2013-09-23: qty 1
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
## 2013-09-24 NOTE — ED Provider Notes (Signed)
Medical screening examination/treatment/procedure(s) were performed by non-physician practitioner and as supervising physician I was immediately available for consultation/collaboration.   EKG Interpretation None      Rolland Porter, MD, [facility] S[facility]   Janice Norrie, MD 09/24/13 [phone]

## 2013-11-28 DIAGNOSIS — L03119 Cellulitis of unspecified part of limb: Secondary | ICD-10-CM | POA: Diagnosis not present

## 2013-11-28 DIAGNOSIS — E1165 Type 2 diabetes mellitus with hyperglycemia: Secondary | ICD-10-CM | POA: Diagnosis not present

## 2013-11-28 DIAGNOSIS — Z1329 Encounter for screening for other suspected endocrine disorder: Secondary | ICD-10-CM | POA: Diagnosis not present

## 2014-01-23 DIAGNOSIS — H3522 Other non-diabetic proliferative retinopathy, left eye: Secondary | ICD-10-CM | POA: Diagnosis not present

## 2014-01-23 DIAGNOSIS — H3562 Retinal hemorrhage, left eye: Secondary | ICD-10-CM | POA: Diagnosis not present

## 2014-01-23 DIAGNOSIS — H3322 Serous retinal detachment, left eye: Secondary | ICD-10-CM | POA: Diagnosis not present

## 2014-01-23 DIAGNOSIS — S0592XD Unspecified injury of left eye and orbit, subsequent encounter: Secondary | ICD-10-CM | POA: Diagnosis not present

## 2014-07-01 DIAGNOSIS — H3522 Other non-diabetic proliferative retinopathy, left eye: Secondary | ICD-10-CM | POA: Diagnosis not present

## 2014-07-01 DIAGNOSIS — H35413 Lattice degeneration of retina, bilateral: Secondary | ICD-10-CM | POA: Diagnosis not present

## 2014-07-01 DIAGNOSIS — H3322 Serous retinal detachment, left eye: Secondary | ICD-10-CM | POA: Diagnosis not present

## 2014-07-01 DIAGNOSIS — H4423 Degenerative myopia, bilateral: Secondary | ICD-10-CM | POA: Diagnosis not present

## 2014-07-29 ENCOUNTER — Ambulatory Visit: Payer: Medicare Other | Attending: Internal Medicine | Admitting: Internal Medicine

## 2014-07-29 ENCOUNTER — Encounter: Payer: Self-pay | Admitting: Internal Medicine

## 2014-07-29 VITALS — BP 112/80 | HR 84 | Temp 98.1°F | Resp 16 | Wt 296.0 lb

## 2014-07-29 DIAGNOSIS — G4733 Obstructive sleep apnea (adult) (pediatric): Secondary | ICD-10-CM | POA: Diagnosis not present

## 2014-07-29 DIAGNOSIS — Z1211 Encounter for screening for malignant neoplasm of colon: Secondary | ICD-10-CM | POA: Insufficient documentation

## 2014-07-29 DIAGNOSIS — E119 Type 2 diabetes mellitus without complications: Secondary | ICD-10-CM | POA: Insufficient documentation

## 2014-07-29 LAB — TSH: TSH: 1.34 u[IU]/mL (ref 0.350–4.500)

## 2014-07-29 LAB — COMPLETE METABOLIC PANEL WITH GFR
ALT: 20 U/L (ref 0–53)
AST: 15 U/L (ref 0–37)
Albumin: 4.3 g/dL (ref 3.5–5.2)
Alkaline Phosphatase: 78 U/L (ref 39–117)
BUN: 12 mg/dL (ref 6–23)
CO2: 27 mEq/L (ref 19–32)
Calcium: 10 mg/dL (ref 8.4–10.5)
Chloride: 102 mEq/L (ref 96–112)
Creat: 0.98 mg/dL (ref 0.50–1.35)
GFR, Est African American: >89 mL/min
GFR, Est Non African American: 85 mL/min
Glucose, Bld: 197 mg/dL — ABNORMAL HIGH (ref 70–99)
Potassium: 5.1 mEq/L (ref 3.5–5.3)
Sodium: 142 mEq/L (ref 135–145)
Total Bilirubin: 0.4 mg/dL (ref 0.2–1.2)
Total Protein: 7.1 g/dL (ref 6.0–8.3)

## 2014-07-29 LAB — LIPID PANEL
Cholesterol: 159 mg/dL (ref 0–200)
HDL: 35 mg/dL — ABNORMAL LOW (ref >=40)
LDL Cholesterol: 100 mg/dL — ABNORMAL HIGH (ref 0–99)
Total CHOL/HDL Ratio: 4.5 Ratio
Triglycerides: 118 mg/dL (ref <150)
VLDL: 24 mg/dL (ref 0–40)

## 2014-07-29 LAB — CBC WITH DIFFERENTIAL/PLATELET
Basophils Absolute: 0 10*3/uL (ref 0.0–0.1)
Basophils Relative: 0 % (ref 0–1)
Eosinophils Absolute: 0.1 10*3/uL (ref 0.0–0.7)
Eosinophils Relative: 1 % (ref 0–5)
HCT: 46.8 % (ref 39.0–52.0)
Hemoglobin: 16.4 g/dL (ref 13.0–17.0)
Lymphocytes Relative: 48 % — ABNORMAL HIGH (ref 12–46)
Lymphs Abs: 3.4 10*3/uL (ref 0.7–4.0)
MCH: 27.1 pg (ref 26.0–34.0)
MCHC: 35 g/dL (ref 30.0–36.0)
MCV: 77.2 fL — ABNORMAL LOW (ref 78.0–100.0)
MPV: 10.8 fL (ref 8.6–12.4)
Monocytes Absolute: 0.4 10*3/uL (ref 0.1–1.0)
Monocytes Relative: 6 % (ref 3–12)
Neutro Abs: 3.2 10*3/uL (ref 1.7–7.7)
Neutrophils Relative %: 45 % (ref 43–77)
Platelets: 169 10*3/uL (ref 150–400)
RBC: 6.06 MIL/uL — ABNORMAL HIGH (ref 4.22–5.81)
RDW: 14.2 % (ref 11.5–15.5)
WBC: 7 10*3/uL (ref 4.0–10.5)

## 2014-07-29 LAB — POCT GLYCOSYLATED HEMOGLOBIN (HGB A1C): Hemoglobin A1C: 13

## 2014-07-29 LAB — GLUCOSE, POCT (MANUAL RESULT ENTRY): POC Glucose: 211 mg/dl — AB (ref 70–99)

## 2014-07-29 MED ORDER — METFORMIN HCL 1000 MG PO TABS
1000.0000 mg | ORAL_TABLET | Freq: Two times a day (BID) | ORAL | Status: DC
Start: 2014-07-29 — End: 2014-08-02

## 2014-07-29 MED ORDER — DAPAGLIFLOZIN PROPANEDIOL 10 MG PO TABS: 10.0000 mg | ORAL_TABLET | Freq: Every day | ORAL | Status: DC

## 2014-07-29 MED ORDER — ENALAPRIL MALEATE 2.5 MG PO TABS: 2.5000 mg | ORAL_TABLET | Freq: Every day | ORAL | Status: DC

## 2014-07-29 MED ORDER — GLIPIZIDE ER 5 MG PO TB24: 5.0000 mg | ORAL_TABLET | Freq: Every day | ORAL | Status: DC

## 2014-07-29 NOTE — Progress Notes (Signed)
New patient here to discuss Diabetes. He states the medicine is not working for him.

## 2014-07-29 NOTE — Progress Notes (Signed)
Patient ID: Lee Reid, male   DOB: Jul 09, 1955, 59 y.o.   MRN: [id number]   Lee Reid, is a 59 y.o. male  QMG:[id number]  [id number]  DOB - [birth year]  CC:  Chief Complaint  Patient presents with  . New Patient    Diabetes        HPI: [name] is a 59 y.o. male here today to establish medical care. Patient has type 2 diabetes mellitus diagnosed around year 2010 and has since been on glipizide and metformin, patient recently relocated to [facility] from a neighboring [place] and has not been able to get a primary care doctor, ran out of his medications about one and half months ago, and blood sugar has been in the 300s. He is here today for refill of his medications. He is on enalapril dose for renal protection. Patient reports no known complication of Diabetes mellitus, he has no complaints today. His last colonoscopy was about 4 years ago but was instructed to have a repeat colonoscopy in 3 years because of extent of polyps that were resected. Patient has not had eye examination on foot examination. His other complaint is excessive snoring and possible sleep apnea. He has been working out and eating a better diet in a deliberate attempt to lose weight. He also has occasional erectile dysfunction. He uses very thick glasses because of refractory error and has regular checkup with optometrist. Patient has No headache, No chest pain, No abdominal pain - No Nausea, No new weakness tingling or numbness, No Cough - SOB. Patient smokes about 1 pack of cigarette per day for a long period of time. He is currently trying to cut down.  No Known Allergies Past Medical History  Diagnosis Date  . Diabetes mellitus without complication    Current Outpatient Prescriptions on File Prior to Visit  Medication Sig Dispense Refill  . cyclobenzaprine (FLEXERIL) 10 MG tablet Take 1 tablet (10 mg total) by mouth 3 (three) times daily as needed for muscle spasms. (Patient not taking: Reported on  07/29/2014) 30 tablet 0  . ibuprofen (ADVIL,MOTRIN) 800 MG tablet Take 1 tablet (800 mg total) by mouth 3 (three) times daily. (Patient not taking: Reported on 07/29/2014) 21 tablet 0   No current facility-administered medications on file prior to visit.   No family history on file. History   Social History  . Marital Status: Married    Spouse Name: N/A  . Number of Children: N/A  . Years of Education: N/A   Occupational History  . Not on file.   Social History Main Topics  . Smoking status: Current Every Day Smoker -- 0.25 packs/day    Types: Cigarettes  . Smokeless tobacco: Never Used  . Alcohol Use: No  . Drug Use: No  . Sexual Activity: Not on file   Other Topics Concern  . Not on file   Social History Narrative  . No narrative on file    Review of Systems: Constitutional: Negative for fever, chills, diaphoresis, activity change, appetite change and fatigue. HENT: Negative for ear pain, nosebleeds, congestion, facial swelling, rhinorrhea, neck pain, neck stiffness and ear discharge.  Eyes: Negative for pain, discharge, redness, itching and visual disturbance. Respiratory: Negative for cough, choking, chest tightness, shortness of breath, wheezing and stridor.  Cardiovascular: Negative for chest pain, palpitations and leg swelling. Gastrointestinal: Negative for abdominal distention. Genitourinary: Negative for dysuria, urgency, frequency, hematuria, flank pain, decreased urine volume, difficulty urinating and dyspareunia.  Musculoskeletal: Negative for back pain, joint swelling, arthralgia  and gait problem. Neurological: Negative for dizziness, tremors, seizures, syncope, facial asymmetry, speech difficulty, weakness, light-headedness, numbness and headaches.  Hematological: Negative for adenopathy. Does not bruise/bleed easily. Psychiatric/Behavioral: Negative for hallucinations, behavioral problems, confusion, dysphoric mood, decreased concentration and agitation.     Objective:   Filed Vitals:   07/29/14 0957  BP: 112/80  Pulse: 84  Temp: 98.1 F (36.7 C)  Resp: 16    Physical Exam: Constitutional: Patient appears well-developed and well-nourished. No distress, morbidly obese HENT: Normocephalic, atraumatic, External right and left ear normal. Oropharynx is clear and moist.  Eyes: Conjunctivae and EOM are normal. PERRLA, no scleral icterus. Neck: Normal ROM. Neck supple. No JVD. No tracheal deviation. No thyromegaly. CVS: RRR, distant S1/S2 +, no murmurs, no gallops, no carotid bruit.  Pulmonary: Effort and breath sounds normal, no stridor, rhonchi, wheezes, rales.  Abdominal: Soft. BS +, no distension, tenderness, rebound or guarding.  Musculoskeletal: Normal range of motion. No edema and no tenderness.  Lymphadenopathy: No lymphadenopathy noted, cervical, inguinal or axillary Neuro: Alert. Normal reflexes, muscle tone coordination. No cranial nerve deficit. Skin: Skin is warm and dry. No rash noted. Not diaphoretic. No erythema. No pallor. Psychiatric: Normal mood and affect. Behavior, judgment, thought content normal.  Lab Results  Component Value Date   WBC 6.7 03/07/2013   HGB 15.3 03/07/2013   HCT 42.9 03/07/2013   MCV 75.7* 03/07/2013   PLT 142* 03/07/2013   Lab Results  Component Value Date   CREATININE 0.80 03/07/2013   BUN 9 03/07/2013   NA 135* 03/07/2013   K 4.4 03/07/2013   CL 99 03/07/2013   CO2 25 03/07/2013    Lab Results  Component Value Date   HGBA1C 13.0 07/29/2014   Lipid Panel  No results found for: CHOL, TRIG, HDL, CHOLHDL, VLDL, LDLCALC     Assessment and plan:   1. Type 2 diabetes mellitus without complications  - POCT glycosylated hemoglobin (Hb A1C) - Glucose (CBG) - CBC with Differential/Platelet - COMPLETE METABOLIC PANEL WITH GFR - Lipid panel - TSH - Urinalysis, Complete - Microalbumin, urine - Ambulatory referral to Ophthalmology - Ambulatory referral to Podiatry  Start -  dapagliflozin propanediol (FARXIGA) 10 MG TABS tablet; Take 10 mg by mouth daily.  Dispense: 30 tablet; Refill: 3 Continue: - glipiZIDE (GLUCOTROL XL) 5 MG 24 hr tablet; Take 1 tablet (5 mg total) by mouth daily with breakfast.  Dispense: 90 tablet; Refill: 3 - metFORMIN (GLUCOPHAGE) 1000 MG tablet; Take 1 tablet (1,000 mg total) by mouth 2 (two) times daily with a meal.  Dispense: 1000 tablet; Refill: 3 - enalapril (VASOTEC) 2.5 MG tablet; Take 1 tablet (2.5 mg total) by mouth daily.  Dispense: 30 tablet; Refill: 3  2. Morbid obesity   Aim for 30 minutes of exercise most days. Rethink what you drink. Water is great! Aim for 2-3 Carb Choices per meal (30-45 grams) +/- 1 either way  Aim for 0-15 Carbs per snack if hungry  Include protein in moderation with your meals and snacks  Consider reading food labels for Total Carbohydrate and Fat Grams of foods  Consider checking BG at alternate times per day  Continue taking medication as directed Be mindful about how much sugar you are adding to beverages and other foods. Try to decrease. Consider splenda. Fruit Punch - find one with no sugar  Measure and decrease portions of carbohydrate foods  Make your plate and don't go back for seconds  3. Colon cancer screening  - Ambulatory  referral to Gastroenterology  4. OSA (obstructive sleep apnea)  - Split night study; Future  Return in about 4 weeks (around 08/26/2014) for Routine Follow Up, Diabetes.  The patient was given clear instructions to go to ER or return to medical center if symptoms don't improve, worsen or new problems develop. The patient verbalized understanding. The patient was told to call to get lab results if they haven't heard anything in the next week.     This note has been created with Surveyor, quantity. Any transcriptional errors are unintentional.    Angelica Chessman, MD, [place], [place], [place], [place]   07/29/2014, 10:58 AM

## 2014-07-30 DIAGNOSIS — H4423 Degenerative myopia, bilateral: Secondary | ICD-10-CM | POA: Diagnosis not present

## 2014-07-30 LAB — URINALYSIS, COMPLETE
Bacteria, UA: NONE SEEN
Bilirubin Urine: NEGATIVE
Casts: NONE SEEN
Crystals: NONE SEEN
Glucose, UA: 250 mg/dL — AB
Hgb urine dipstick: NEGATIVE
Ketones, ur: NEGATIVE mg/dL
Leukocytes, UA: NEGATIVE
Nitrite: NEGATIVE
Protein, ur: NEGATIVE mg/dL
Specific Gravity, Urine: 1.024 (ref 1.005–1.030)
Squamous Epithelial / LPF: NONE SEEN
Urobilinogen, UA: 0.2 mg/dL (ref 0.0–1.0)
pH: 5 (ref 5.0–8.0)

## 2014-07-30 LAB — MICROALBUMIN, URINE: Microalb, Ur: 7.9 mg/dL — ABNORMAL HIGH (ref <2.0)

## 2014-08-02 ENCOUNTER — Ambulatory Visit (HOSPITAL_BASED_OUTPATIENT_CLINIC_OR_DEPARTMENT_OTHER): Payer: Medicare Other

## 2014-08-02 ENCOUNTER — Telehealth: Payer: Self-pay

## 2014-08-02 ENCOUNTER — Other Ambulatory Visit: Payer: Self-pay

## 2014-08-02 DIAGNOSIS — E119 Type 2 diabetes mellitus without complications: Secondary | ICD-10-CM

## 2014-08-02 MED ORDER — DAPAGLIFLOZIN PROPANEDIOL 10 MG PO TABS: 10.0000 mg | ORAL_TABLET | Freq: Every day | ORAL | Status: DC

## 2014-08-02 MED ORDER — METFORMIN HCL 1000 MG PO TABS: 1000.0000 mg | ORAL_TABLET | Freq: Two times a day (BID) | ORAL | Status: DC

## 2014-08-02 MED ORDER — ENALAPRIL MALEATE 2.5 MG PO TABS: 2.5000 mg | ORAL_TABLET | Freq: Every day | ORAL | Status: DC

## 2014-08-02 MED ORDER — GLIPIZIDE ER 5 MG PO TB24: 5.0000 mg | ORAL_TABLET | Freq: Every day | ORAL | Status: DC

## 2014-08-02 NOTE — Telephone Encounter (Signed)
Nurse called patient, patient verified date of birth. Patient aware of normal lab results except blood sugar.  Patient aware of slight protein leakage from kidneys and agrees to take enalapril as prescribed. Patient aware of recommendations of eating low carb, low sugar diet and including regular physical exercise, at least three times a week, 30 minutes each time.  Patient agrees to drink water frequently.  Patient requests nurse to resend prescriptions to [facility] instead of [facility].  Prescriptions have been sent. Patient questions when next appointment is scheduled. Nurse informed patient to return call on July 25 to make appointment for 08/26/14. Patient voices understanding and has no further questions at this time.

## 2014-08-02 NOTE — Telephone Encounter (Signed)
-----   Message from Tresa Garter, MD sent at 07/31/2014 11:27 AM EDT ----- Please inform patient that his laboratory test results are mostly within normal limits except for his blood sugar. There is slight protein leakage from the kidneys, continue enalapril as prescribed. Low carbohydrate, low sugar diet, regular physical exercise at least 3 times a week, 30 minutes each time. Drink water frequently.

## 2014-08-20 ENCOUNTER — Emergency Department (HOSPITAL_COMMUNITY): Payer: Medicare Other

## 2014-08-20 ENCOUNTER — Encounter (HOSPITAL_COMMUNITY): Payer: Self-pay

## 2014-08-20 ENCOUNTER — Emergency Department (HOSPITAL_COMMUNITY)
Admission: EM | Admit: 2014-08-20 | Discharge: 2014-08-20 | Disposition: A | Discharge status: Home / Self Care | Payer: Medicare Other | Attending: Emergency Medicine | Admitting: Emergency Medicine

## 2014-08-20 DIAGNOSIS — R109 Unspecified abdominal pain: Secondary | ICD-10-CM | POA: Diagnosis present

## 2014-08-20 DIAGNOSIS — K8020 Calculus of gallbladder without cholecystitis without obstruction: Secondary | ICD-10-CM | POA: Diagnosis not present

## 2014-08-20 DIAGNOSIS — Z720 Tobacco use: Secondary | ICD-10-CM | POA: Diagnosis not present

## 2014-08-20 DIAGNOSIS — K088 Other specified disorders of teeth and supporting structures: Secondary | ICD-10-CM | POA: Diagnosis not present

## 2014-08-20 DIAGNOSIS — Z79899 Other long term (current) drug therapy: Secondary | ICD-10-CM | POA: Diagnosis not present

## 2014-08-20 DIAGNOSIS — K047 Periapical abscess without sinus: Secondary | ICD-10-CM | POA: Insufficient documentation

## 2014-08-20 DIAGNOSIS — K5900 Constipation, unspecified: Secondary | ICD-10-CM | POA: Insufficient documentation

## 2014-08-20 DIAGNOSIS — R112 Nausea with vomiting, unspecified: Secondary | ICD-10-CM | POA: Insufficient documentation

## 2014-08-20 DIAGNOSIS — R1011 Right upper quadrant pain: Secondary | ICD-10-CM | POA: Diagnosis not present

## 2014-08-20 DIAGNOSIS — E119 Type 2 diabetes mellitus without complications: Secondary | ICD-10-CM | POA: Diagnosis not present

## 2014-08-20 LAB — CBC WITH DIFFERENTIAL/PLATELET
Basophils Absolute: 0 10*3/uL (ref 0.0–0.1)
Basophils Relative: 0 % (ref 0–1)
Eosinophils Absolute: 0.1 10*3/uL (ref 0.0–0.7)
Eosinophils Relative: 1 % (ref 0–5)
HCT: 42.6 % (ref 39.0–52.0)
Hemoglobin: 14.6 g/dL (ref 13.0–17.0)
Lymphocytes Relative: 45 % (ref 12–46)
Lymphs Abs: 3 10*3/uL (ref 0.7–4.0)
MCH: 26.1 pg (ref 26.0–34.0)
MCHC: 34.3 g/dL (ref 30.0–36.0)
MCV: 76.2 fL — ABNORMAL LOW (ref 78.0–100.0)
Monocytes Absolute: 0.4 10*3/uL (ref 0.1–1.0)
Monocytes Relative: 5 % (ref 3–12)
Neutro Abs: 3.3 10*3/uL (ref 1.7–7.7)
Neutrophils Relative %: 49 % (ref 43–77)
Platelets: 157 10*3/uL (ref 150–400)
RBC: 5.59 MIL/uL (ref 4.22–5.81)
RDW: 13.3 % (ref 11.5–15.5)
WBC: 6.8 10*3/uL (ref 4.0–10.5)

## 2014-08-20 LAB — COMPREHENSIVE METABOLIC PANEL
ALT: 23 U/L (ref 17–63)
AST: 18 U/L (ref 15–41)
Albumin: 4 g/dL (ref 3.5–5.0)
Alkaline Phosphatase: 70 U/L (ref 38–126)
Anion gap: 6 (ref 5–15)
BUN: 17 mg/dL (ref 6–20)
CO2: 27 mmol/L (ref 22–32)
Calcium: 9.4 mg/dL (ref 8.9–10.3)
Chloride: 105 mmol/L (ref 101–111)
Creatinine, Ser: 1.03 mg/dL (ref 0.61–1.24)
GFR calc Af Amer: >60 mL/min (ref >60)
GFR calc non Af Amer: >60 mL/min (ref >60)
Glucose, Bld: 199 mg/dL — ABNORMAL HIGH (ref 65–99)
Potassium: 4.1 mmol/L (ref 3.5–5.1)
Sodium: 138 mmol/L (ref 135–145)
Total Bilirubin: 0.5 mg/dL (ref 0.3–1.2)
Total Protein: 7.6 g/dL (ref 6.5–8.1)

## 2014-08-20 LAB — URINALYSIS, ROUTINE W REFLEX MICROSCOPIC
Bilirubin Urine: NEGATIVE
Glucose, UA: >1000 mg/dL — AB
Hgb urine dipstick: NEGATIVE
Ketones, ur: NEGATIVE mg/dL
Leukocytes, UA: NEGATIVE
Nitrite: NEGATIVE
Protein, ur: NEGATIVE mg/dL
Specific Gravity, Urine: 1.025 (ref 1.005–1.030)
Urobilinogen, UA: 0.2 mg/dL (ref 0.0–1.0)
pH: 5.5 (ref 5.0–8.0)

## 2014-08-20 LAB — URINE MICROSCOPIC-ADD ON

## 2014-08-20 LAB — LIPASE, BLOOD: Lipase: 32 U/L (ref 22–51)

## 2014-08-20 MED ORDER — MORPHINE SULFATE 4 MG/ML IJ SOLN
4.0000 mg | Freq: Once | INTRAMUSCULAR | Status: AC
  Administered 2014-08-20: 4 mg via INTRAVENOUS
  Filled 2014-08-20: qty 1

## 2014-08-20 MED ORDER — ONDANSETRON HCL 4 MG/2ML IJ SOLN
4.0000 mg | Freq: Once | INTRAMUSCULAR | Status: AC
  Administered 2014-08-20: 4 mg via INTRAVENOUS
  Filled 2014-08-20: qty 2

## 2014-08-20 MED ORDER — SODIUM CHLORIDE 0.9 % IV BOLUS (SEPSIS)
1000.0000 mL | Freq: Once | INTRAVENOUS | Status: AC
  Administered 2014-08-20: 1000 mL via INTRAVENOUS

## 2014-08-20 MED ORDER — ONDANSETRON 4 MG PO TBDP: ORAL_TABLET | ORAL | Status: DC

## 2014-08-20 MED ORDER — OXYCODONE-ACETAMINOPHEN 5-325 MG PO TABS: 1.0000 | ORAL_TABLET | ORAL | Status: DC | PRN

## 2014-08-20 MED ORDER — PENICILLIN V POTASSIUM 500 MG PO TABS: 500.0000 mg | ORAL_TABLET | Freq: Three times a day (TID) | ORAL | Status: DC

## 2014-08-20 NOTE — ED Notes (Signed)
Pt states rlq abdominal pain since Saturday.  N/V.  No fever.  No change in urination.  Pt is constipated.  Last bm was today but was different than normal.  Also having dental swelling.

## 2014-08-20 NOTE — ED Notes (Signed)
US at bedside

## 2014-08-20 NOTE — ED Provider Notes (Signed)
CSN: [id number]     Arrival date & time 08/20/14  0705 History   First MD Initiated Contact with Patient 08/20/14 [phone]     Chief Complaint  Patient presents with  . Abdominal Pain  . Dental Pain     (Consider location/radiation/quality/duration/timing/severity/associated sxs/prior Treatment) HPI Comments: Pt with hx of DM presents with abdominal pain. He states he's had worsening pain to his right upper abdomen the last 3-4 days. He's had associated nausea and vomiting. He denies any fevers. He feels that he was seen about a year ago and diagnosed with gallstones but he did not follow-up on this. He feels that this feels similar to his episode when he had gallstones. He denies any urinary symptoms other than he does have some symptoms of BPH. He does report some constipation. He also has some swelling that is not improved in the last 2 days to his upper tooth. He denies any significant pain to the area.  Patient is a 59 y.o. male presenting with abdominal pain and tooth pain.  Abdominal Pain Associated symptoms: constipation, nausea and vomiting   Associated symptoms: no chest pain, no chills, no cough, no diarrhea, no fatigue, no fever, no hematuria and no shortness of breath   Dental Pain Associated symptoms: no congestion, no fever and no headaches     Past Medical History  Diagnosis Date  . Diabetes mellitus without complication    Past Surgical History  Procedure Laterality Date  . Tonsillectomy     History reviewed. No pertinent family history. History  Substance Use Topics  . Smoking status: Current Every Day Smoker -- 0.25 packs/day    Types: Cigarettes  . Smokeless tobacco: Never Used  . Alcohol Use: No    Review of Systems  Constitutional: Negative for fever, chills, diaphoresis and fatigue.  HENT: Positive for dental problem. Negative for congestion, rhinorrhea and sneezing.   Eyes: Negative.   Respiratory: Negative for cough, chest tightness and shortness of  breath.   Cardiovascular: Negative for chest pain and leg swelling.  Gastrointestinal: Positive for nausea, vomiting, abdominal pain and constipation. Negative for diarrhea and blood in stool.  Genitourinary: Negative for frequency, hematuria, flank pain and difficulty urinating.  Musculoskeletal: Negative for back pain and arthralgias.  Skin: Negative for rash.  Neurological: Negative for dizziness, speech difficulty, weakness, numbness and headaches.      Allergies  Review of patient's allergies indicates no known allergies.  Home Medications   Prior to Admission medications   Medication Sig Start Date End Date Taking? Authorizing Provider  dapagliflozin propanediol (FARXIGA) 10 MG TABS tablet Take 10 mg by mouth daily. 08/02/14  Yes Tresa Garter, MD  enalapril (VASOTEC) 2.5 MG tablet Take 1 tablet (2.5 mg total) by mouth daily. 08/02/14  Yes Tresa Garter, MD  glipiZIDE (GLUCOTROL XL) 5 MG 24 hr tablet Take 1 tablet (5 mg total) by mouth daily with breakfast. 08/02/14  Yes Tresa Garter, MD  metFORMIN (GLUCOPHAGE) 1000 MG tablet Take 1 tablet (1,000 mg total) by mouth 2 (two) times daily with a meal. 08/02/14  Yes Tresa Garter, MD  Multiple Vitamin (MULTIVITAMIN WITH MINERALS) TABS tablet Take 1 tablet by mouth daily.   Yes Historical Provider, MD  cyclobenzaprine (FLEXERIL) 10 MG tablet Take 1 tablet (10 mg total) by mouth 3 (three) times daily as needed for muscle spasms. Patient not taking: Reported on 07/29/2014 09/23/13   Hazel Sams, PA-C  ibuprofen (ADVIL,MOTRIN) 800 MG tablet Take 1 tablet (800  mg total) by mouth 3 (three) times daily. Patient not taking: Reported on 07/29/2014 09/23/13   Hazel Sams, PA-C  ondansetron (ZOFRAN ODT) 4 MG disintegrating tablet 4mg  ODT q4 hours prn nausea/vomit 08/20/14   Malvin Johns, MD  oxyCODONE-acetaminophen (PERCOCET) 5-325 MG per tablet Take 1-2 tablets by mouth every 4 (four) hours as needed. 08/20/14   Malvin Johns, MD   penicillin v potassium (VEETID) 500 MG tablet Take 1 tablet (500 mg total) by mouth 3 (three) times daily. 08/20/14   Malvin Johns, MD   BP 113/61 mmHg  Pulse 72  Temp(Src) 98.2 F (36.8 C) (Oral)  Resp 16  Ht 6\' 1"  (1.854 m)  Wt 290 lb (131.543 kg)  BMI 38.27 kg/m2  SpO2 100% Physical Exam  Constitutional: He is oriented to person, place, and time. He appears well-developed and well-nourished.  HENT:  Head: Normocephalic and atraumatic.  Small periapical abscess to left upper medial incisor  Eyes: Pupils are equal, round, and reactive to light.  Neck: Normal range of motion. Neck supple.  Cardiovascular: Normal rate, regular rhythm and normal heart sounds.   Pulmonary/Chest: Effort normal and breath sounds normal. No respiratory distress. He has no wheezes. He has no rales. He exhibits no tenderness.  Abdominal: Soft. Bowel sounds are normal. There is tenderness (moderate TTP RUQ). There is no rebound and no guarding.  Musculoskeletal: Normal range of motion. He exhibits no edema.  Lymphadenopathy:    He has no cervical adenopathy.  Neurological: He is alert and oriented to person, place, and time.  Skin: Skin is warm and dry. No rash noted.  Psychiatric: He has a normal mood and affect.    ED Course  Procedures (including critical care time) Labs Review Labs Reviewed  COMPREHENSIVE METABOLIC PANEL - Abnormal; Notable for the following:    Glucose, Bld 199 (*)    All other components within normal limits  CBC WITH DIFFERENTIAL/PLATELET - Abnormal; Notable for the following:    MCV 76.2 (*)    All other components within normal limits  URINALYSIS, ROUTINE W REFLEX MICROSCOPIC (NOT AT [facility]) - Abnormal; Notable for the following:    Glucose, UA >1000 (*)    All other components within normal limits  LIPASE, BLOOD  URINE MICROSCOPIC-ADD ON    Imaging Review US Abdomen Complete  08/20/2014   CLINICAL DATA:  Right upper quadrant abdominal pain for 3 days  EXAM: ULTRASOUND  ABDOMEN COMPLETE  COMPARISON:  03/07/2013  FINDINGS: Gallbladder: Multiple gallstones reidentified, largest 2.9 cm, without gallbladder wall thickening, pericholecystic fluid, or sonographic Murphy sign.  Common bile duct: Diameter: 6 mm  Liver: Inhomogeneous increased echotexture without focal abnormality or intrahepatic ductal dilatation  IVC: No abnormality visualized.  Pancreas: Visualized portion unremarkable.  Spleen: Size and appearance within normal limits.  Right Kidney: Length: 11.7 cm. Normal echogenicity. No solid mass. Mid renal cortical cyst measures 2.0 x 1.7 x 1.5 cm.  Left Kidney: Length: 11.6 cm. Echogenicity within normal limits. No mass or hydronephrosis visualized.  Abdominal aorta: No aneurysm visualized.  Other findings: None.  IMPRESSION: Increased hepatic echogenicity without focal abnormality, likely due to fatty infiltration although other infiltrative disorders may appear similar.  Stone filled gallbladder without other sonographic evidence for acute cholecystitis.   Electronically Signed   By: Conchita Paris M.D.   On: 08/20/2014 09:13   Dg Abd Acute W/chest  08/20/2014   CLINICAL DATA:  59 year old male with right upper quadrant pain over the past 4 days. Initial encounter.  EXAM:  DG ABDOMEN ACUTE W/ 1V CHEST  COMPARISON:  01/10/2013 chest x-ray.  FINDINGS: Central pulmonary vascular prominence with minimal peribronchial thickening stable.  Minimally tortuous aorta.  Heart size within normal limits.  No infiltrate, congestive heart failure or pneumothorax.  No plain film evidence of pulmonary malignancy.  Calcified gallstone measuring up to 3.5 cm.  Stool throughout the colon without plain film evidence of bowel obstruction or free intraperitoneal air.  Scoliosis and degenerative changes.  IMPRESSION: No acute pulmonary abnormality as noted above.  3.5 cm calcified gallstone  Stool throughout colon without evidence of bowel obstruction or free intraperitoneal air.   Electronically  Signed   By: Genia Del M.D.   On: 08/20/2014 11:11     EKG Interpretation None      MDM   Final diagnoses:  Abdominal pain  Calculus of gallbladder without cholecystitis without obstruction  Periapical abscess    Patient has evidence of gallbladder disease with out cholecystitis. His pain is well controlled in the ED. He was discharged home in good condition. He was advised in a low-fat diet. He was given prescription for Percocet and Zofran. He was given a referral to follow-up with general surgery. Return precautions were given. He was also started on penicillin for his small periapical abscess and encouraged to follow-up with a dentist.    Malvin Johns, MD 08/20/14 1150

## 2014-08-20 NOTE — Discharge Instructions (Signed)
Biliary Colic  °Biliary colic is a steady or irregular pain in the upper abdomen. It is usually under the right side of the rib cage. It happens when gallstones interfere with the normal flow of bile from the gallbladder. Bile is a liquid that helps to digest fats. Bile is made in the liver and stored in the gallbladder. When you eat a meal, bile passes from the gallbladder through the cystic duct and the common bile duct into the small intestine. There, it mixes with partially digested food. If a gallstone blocks either of these ducts, the normal flow of bile is blocked. The muscle cells in the bile duct contract forcefully to try to move the stone. This causes the pain of biliary colic.  °SYMPTOMS  °· A person with biliary colic usually complains of pain in the upper abdomen. This pain can be: °· In the center of the upper abdomen just below the breastbone. °· In the upper-right part of the abdomen, near the gallbladder and liver. °· Spread back toward the right shoulder blade. °· Nausea and vomiting. °· The pain usually occurs after eating. °· Biliary colic is usually triggered by the digestive system's demand for bile. The demand for bile is high after fatty meals. Symptoms can also occur when a person who has been fasting suddenly eats a very large meal. Most episodes of biliary colic pass after 1 to 5 hours. After the most intense pain passes, your abdomen may continue to ache mildly for about 24 hours. °DIAGNOSIS  °After you describe your symptoms, your caregiver will perform a physical exam. He or she will pay attention to the upper right portion of your belly (abdomen). This is the area of your liver and gallbladder. An ultrasound will help your caregiver look for gallstones. Specialized scans of the gallbladder may also be done. Blood tests may be done, especially if you have fever or if your pain persists. °PREVENTION  °Biliary colic can be prevented by controlling the risk factors for gallstones. Some of  these risk factors, such as heredity, increasing age, and pregnancy are a normal part of life. Obesity and a high-fat diet are risk factors you can change through a healthy lifestyle. Women going through menopause who take hormone replacement therapy (estrogen) are also more likely to develop biliary colic. °TREATMENT  °· Pain medication may be prescribed. °· You may be encouraged to eat a fat-free diet. °· If the first episode of biliary colic is severe, or episodes of colic keep retuning, surgery to remove the gallbladder (cholecystectomy) is usually recommended. This procedure can be done through small incisions using an instrument called a laparoscope. The procedure often requires a brief stay in the hospital. Some people can leave the hospital the same day. It is the most widely used treatment in people troubled by painful gallstones. It is effective and safe, with no complications in more than 90% of cases. °· If surgery cannot be done, medication that dissolves gallstones may be used. This medication is expensive and can take months or years to work. Only small stones will dissolve. °· Rarely, medication to dissolve gallstones is combined with a procedure called shock-wave lithotripsy. This procedure uses carefully aimed shock waves to break up gallstones. In many people treated with this procedure, gallstones form again within a few years. °PROGNOSIS  °If gallstones block your cystic duct or common bile duct, you are at risk for repeated episodes of biliary colic. There is also a 25% chance that you will develop   a gallbladder infection(acute cholecystitis), or some other complication of gallstones within 10 to 20 years. If you have surgery, schedule it at a time that is convenient for you and at a time when you are not sick. HOME CARE INSTRUCTIONS   Drink plenty of clear fluids.  Avoid fatty, greasy or fried foods, or any foods that make your pain worse.  Take medications as directed. SEEK MEDICAL  CARE IF:   You develop a fever over 100.5 F (38.1 C).  Your pain gets worse over time.  You develop nausea that prevents you from eating and drinking.  You develop vomiting. SEEK IMMEDIATE MEDICAL CARE IF:   You have continuous or severe belly (abdominal) pain which is not relieved with medications.  You develop nausea and vomiting which is not relieved with medications.  You have symptoms of biliary colic and you suddenly develop a fever and shaking chills. This may signal cholecystitis. Call your caregiver immediately.  You develop a yellow color to your skin or the white part of your eyes (jaundice). Document Released: 06/07/2005 Document Revised: 03/29/2011 Document Reviewed: 08/17/2007 [facility] Patient Information 2015 [place], [place]. This information is not intended to replace advice given to you by your health care provider. Make sure you discuss any questions you have with your health care provider.  Dental Abscess A dental abscess is a collection of infected fluid (pus) from a bacterial infection in the inner part of the tooth (pulp). It usually occurs at the end of the tooth's root.  CAUSES   Severe tooth decay.  Trauma to the tooth that allows bacteria to enter into the pulp, such as a broken or chipped tooth. SYMPTOMS   Severe pain in and around the infected tooth.  Swelling and redness around the abscessed tooth or in the mouth or face.  Tenderness.  Pus drainage.  Bad breath.  Bitter taste in the mouth.  Difficulty swallowing.  Difficulty opening the mouth.  Nausea.  Vomiting.  Chills.  Swollen neck glands. DIAGNOSIS   A medical and dental history will be taken.  An examination will be performed by tapping on the abscessed tooth.  X-rays may be taken of the tooth to identify the abscess. TREATMENT The goal of treatment is to eliminate the infection. You may be prescribed antibiotic medicine to stop the infection from spreading. A root  canal may be performed to save the tooth. If the tooth cannot be saved, it may be pulled (extracted) and the abscess may be drained.  HOME CARE INSTRUCTIONS  Only take over-the-counter or prescription medicines for pain, fever, or discomfort as directed by your caregiver.  Rinse your mouth (gargle) often with salt water ( tsp salt in 8 oz [250 ml] of warm water) to relieve pain or swelling.  Do not drive after taking pain medicine (narcotics).  Do not apply heat to the outside of your face.  Return to your dentist for further treatment as directed. SEEK MEDICAL CARE IF:  Your pain is not helped by medicine.  Your pain is getting worse instead of better. SEEK IMMEDIATE MEDICAL CARE IF:  You have a fever or persistent symptoms for more than 2-3 days.  You have a fever and your symptoms suddenly get worse.  You have chills or a very bad headache.  You have problems breathing or swallowing.  You have trouble opening your mouth.  You have swelling in the neck or around the eye. Document Released: 01/04/2005 Document Revised: 09/29/2011 Document Reviewed: 04/14/2010 [employer] Patient Information  2015 [employer]. This information is not intended to replace advice given to you by your health care provider. Make sure you discuss any questions you have with your health care provider.

## 2014-08-25 ENCOUNTER — Ambulatory Visit (HOSPITAL_BASED_OUTPATIENT_CLINIC_OR_DEPARTMENT_OTHER): Payer: Medicare Other | Attending: Internal Medicine

## 2014-08-27 ENCOUNTER — Ambulatory Visit (INDEPENDENT_AMBULATORY_CARE_PROVIDER_SITE_OTHER): Payer: Medicare Other | Admitting: Podiatry

## 2014-08-27 VITALS — BP 115/80 | HR 76 | Temp 98.3°F | Resp 14

## 2014-08-27 DIAGNOSIS — E119 Type 2 diabetes mellitus without complications: Secondary | ICD-10-CM | POA: Diagnosis not present

## 2014-08-27 DIAGNOSIS — B351 Tinea unguium: Secondary | ICD-10-CM | POA: Diagnosis not present

## 2014-08-27 DIAGNOSIS — M79676 Pain in unspecified toe(s): Secondary | ICD-10-CM

## 2014-08-27 DIAGNOSIS — Q828 Other specified congenital malformations of skin: Secondary | ICD-10-CM | POA: Diagnosis not present

## 2014-08-27 NOTE — Progress Notes (Signed)
   Subjective:    Patient ID: Lee Reid, male    DOB: Jan 11, 1956, 60 y.o.   MRN: [id number]  HPI  This patient presents today complaining of painful toenails when walking wearing shoes over the last 6 months with gradual increase in discomfort. He said he went to the pedicurist with they refused to trim his toenails. He is also complaining of a painful nucleated keratoses on the left foot that causes him to limp when he stands and walks.Marland Kitchen He is applied callous removal cream to the area as well as taking Motrin, however, the pain did not reduce with this treatment.   Review of Systems  All other systems reviewed and are negative.  This patient denies any foot ulceration or claudication    Objective:   Physical Exam  Orientated 3  Vascular: No peripheral edema noted bilaterally DP and PT pulses 2/4 bilaterally Capillary reflex immediate bilaterally  Neurological: Sensation to 10 g monofilament wire intact 5/5 right and 3/5 left Vibratory sensation reactive bilaterally Ankle reflex equal and reactive bilaterally  Dermatological: Dry scaling skin medially, plantarly, laterally bilaterally Keratoses medial right hallux Gray plantar keratoses subsecond MPJ left Diffuse plantar keratoses base fifth left metatarsal The toenails are extremely elongated, brittle, discolored, incurvated and tender to palpation 6-10  Musculoskeletal: Hammertoe deformity second bilaterally There is no restriction ankle, subtalar, midtarsal joints bilaterally      Assessment & Plan:   Assessment: Satisfactory neurovascular status Neglected symptomatic onychomycoses 6-10 Tinea pedis bilaterally  Plan: I reviewed the results of patient's examination today. I made aware today that are primarily wanted to debrided toenails to allow him to wear shoes more comfortably as well as debrided the plantar calluses right and left  Toenails 10 were debrided mechanically and likely without any  bleeding Plantar keratoses in porokeratosis debrided right and left without any bleeding  Will discuss possible treatment options for mycotic toenails in more detail at next visit  Reappoint 3 months for skin a nail debride

## 2014-08-27 NOTE — Progress Notes (Deleted)
   Subjective:    Patient ID: Lee Reid, male    DOB: [birth year], 59 y.o.   MRN: [id number]  HPI    Review of Systems     Objective:   Physical Exam        Assessment & Plan:

## 2014-08-27 NOTE — Patient Instructions (Signed)
Diabetes and Foot Care Diabetes may cause you to have problems because of poor blood supply (circulation) to your feet and legs. This may cause the skin on your feet to become thinner, break easier, and heal more slowly. Your skin may become dry, and the skin may peel and crack. You may also have nerve damage in your legs and feet causing decreased feeling in them. You may not notice minor injuries to your feet that could lead to infections or more serious problems. Taking care of your feet is one of the most important things you can do for yourself.  HOME CARE INSTRUCTIONS  Wear shoes at all times, even in the house. Do not go barefoot. Bare feet are easily injured.  Check your feet daily for blisters, cuts, and redness. If you cannot see the bottom of your feet, use a mirror or ask someone for help.  Wash your feet with warm water (do not use hot water) and mild soap. Then pat your feet and the areas between your toes until they are completely dry. Do not soak your feet as this can dry your skin.  Apply a moisturizing lotion or petroleum jelly (that does not contain alcohol and is unscented) to the skin on your feet and to dry, brittle toenails. Do not apply lotion between your toes.  Trim your toenails straight across. Do not dig under them or around the cuticle. File the edges of your nails with an emery board or nail file.  Do not cut corns or calluses or try to remove them with medicine.  Wear clean socks or stockings every day. Make sure they are not too tight. Do not wear knee-high stockings since they may decrease blood flow to your legs.  Wear shoes that fit properly and have enough cushioning. To break in new shoes, wear them for just a few hours a day. This prevents you from injuring your feet. Always look in your shoes before you put them on to be sure there are no objects inside.  Do not cross your legs. This may decrease the blood flow to your feet.  If you find a minor scrape,  cut, or break in the skin on your feet, keep it and the skin around it clean and dry. These areas may be cleansed with mild soap and water. Do not cleanse the area with peroxide, alcohol, or iodine.  When you remove an adhesive bandage, be sure not to damage the skin around it.  If you have a wound, look at it several times a day to make sure it is healing.  Do not use heating pads or hot water bottles. They may burn your skin. If you have lost feeling in your feet or legs, you may not know it is happening until it is too late.  Make sure your health care provider performs a complete foot exam at least annually or more often if you have foot problems. Report any cuts, sores, or bruises to your health care provider immediately. SEEK MEDICAL CARE IF:   You have an injury that is not healing.  You have cuts or breaks in the skin.  You have an ingrown nail.  You notice redness on your legs or feet.  You feel burning or tingling in your legs or feet.  You have pain or cramps in your legs and feet.  Your legs or feet are numb.  Your feet always feel cold. SEEK IMMEDIATE MEDICAL CARE IF:   There is increasing redness,   swelling, or pain in or around a wound.  There is a red line that goes up your leg.  Pus is coming from a wound.  You develop a fever or as directed by your health care provider.  You notice a bad smell coming from an ulcer or wound. Document Released: 01/02/2000 Document Revised: 09/06/2012 Document Reviewed: 06/13/2012 [employer] Patient Information 2015 [employer]. This information is not intended to replace advice given to you by your health care provider. Make sure you discuss any questions you have with your health care provider.  

## 2014-08-28 DIAGNOSIS — K8020 Calculus of gallbladder without cholecystitis without obstruction: Secondary | ICD-10-CM | POA: Diagnosis not present

## 2014-08-28 DIAGNOSIS — Z789 Other specified health status: Secondary | ICD-10-CM | POA: Diagnosis not present

## 2014-08-29 ENCOUNTER — Encounter: Payer: Self-pay | Admitting: Internal Medicine

## 2014-08-29 ENCOUNTER — Ambulatory Visit: Payer: Medicare Other | Attending: Internal Medicine | Admitting: Internal Medicine

## 2014-08-29 VITALS — BP 111/75 | HR 80 | Temp 98.5°F | Resp 18 | Ht 73.0 in | Wt 306.0 lb

## 2014-08-29 DIAGNOSIS — Z79899 Other long term (current) drug therapy: Secondary | ICD-10-CM | POA: Diagnosis not present

## 2014-08-29 DIAGNOSIS — K829 Disease of gallbladder, unspecified: Secondary | ICD-10-CM | POA: Diagnosis not present

## 2014-08-29 DIAGNOSIS — F17210 Nicotine dependence, cigarettes, uncomplicated: Secondary | ICD-10-CM | POA: Diagnosis not present

## 2014-08-29 DIAGNOSIS — E119 Type 2 diabetes mellitus without complications: Secondary | ICD-10-CM | POA: Diagnosis not present

## 2014-08-29 DIAGNOSIS — Z01818 Encounter for other preprocedural examination: Secondary | ICD-10-CM | POA: Diagnosis not present

## 2014-08-29 LAB — GLUCOSE, POCT (MANUAL RESULT ENTRY): POC Glucose: 162 mg/dl — AB (ref 70–99)

## 2014-08-29 MED ORDER — CANAGLIFLOZIN 100 MG PO TABS: 100.0000 mg | ORAL_TABLET | Freq: Every day | ORAL | Status: DC

## 2014-08-29 NOTE — Patient Instructions (Signed)
Hypertension Hypertension, commonly called high blood pressure, is when the force of blood pumping through your arteries is too strong. Your arteries are the blood vessels that carry blood from your heart throughout your body. A blood pressure reading consists of a higher number over a lower number, such as 110/72. The higher number (systolic) is the pressure inside your arteries when your heart pumps. The lower number (diastolic) is the pressure inside your arteries when your heart relaxes. Ideally you want your blood pressure below 120/80. Hypertension forces your heart to work harder to pump blood. Your arteries may become narrow or stiff. Having hypertension puts you at risk for heart disease, stroke, and other problems.  RISK FACTORS Some risk factors for high blood pressure are controllable. Others are not.  Risk factors you cannot control include:   Race. You may be at higher risk if you are African American.  Age. Risk increases with age.  Gender. Men are at higher risk than women before age 45 years. After age 65, women are at higher risk than men. Risk factors you can control include:  Not getting enough exercise or physical activity.  Being overweight.  Getting too much fat, sugar, calories, or salt in your diet.  Drinking too much alcohol. SIGNS AND SYMPTOMS Hypertension does not usually cause signs or symptoms. Extremely high blood pressure (hypertensive crisis) may cause headache, anxiety, shortness of breath, and nosebleed. DIAGNOSIS  To check if you have hypertension, your health care provider will measure your blood pressure while you are seated, with your arm held at the level of your heart. It should be measured at least twice using the same arm. Certain conditions can cause a difference in blood pressure between your right and left arms. A blood pressure reading that is higher than normal on one occasion does not mean that you need treatment. If one blood pressure reading  is high, ask your health care provider about having it checked again. TREATMENT  Treating high blood pressure includes making lifestyle changes and possibly taking medicine. Living a healthy lifestyle can help lower high blood pressure. You may need to change some of your habits. Lifestyle changes may include:  Following the DASH diet. This diet is high in fruits, vegetables, and whole grains. It is low in salt, red meat, and added sugars.  Getting at least 2 hours of brisk physical activity every week.  Losing weight if necessary.  Not smoking.  Limiting alcoholic beverages.  Learning ways to reduce stress. If lifestyle changes are not enough to get your blood pressure under control, your health care provider may prescribe medicine. You may need to take more than one. Work closely with your health care provider to understand the risks and benefits. HOME CARE INSTRUCTIONS  Have your blood pressure rechecked as directed by your health care provider.   Take medicines only as directed by your health care provider. Follow the directions carefully. Blood pressure medicines must be taken as prescribed. The medicine does not work as well when you skip doses. Skipping doses also puts you at risk for problems.   Do not smoke.   Monitor your blood pressure at home as directed by your health care provider. SEEK MEDICAL CARE IF:   You think you are having a reaction to medicines taken.  You have recurrent headaches or feel dizzy.  You have swelling in your ankles.  You have trouble with your vision. SEEK IMMEDIATE MEDICAL CARE IF:  You develop a severe headache or confusion.    You have unusual weakness, numbness, or feel faint.  You have severe chest or abdominal pain.  You vomit repeatedly.  You have trouble breathing. MAKE SURE YOU:   Understand these instructions.  Will watch your condition.  Will get help right away if you are not doing well or get worse. Document  Released: 01/04/2005 Document Revised: 05/21/2013 Document Reviewed: 10/27/2012 [employer] Patient Information 2015 [employer]. This information is not intended to replace advice given to you by your health care provider. Make sure you discuss any questions you have with your health care provider. Basic Carbohydrate Counting for Diabetes Mellitus Carbohydrate counting is a method for keeping track of the amount of carbohydrates you eat. Eating carbohydrates naturally increases the level of sugar (glucose) in your blood, so it is important for you to know the amount that is okay for you to have in every meal. Carbohydrate counting helps keep the level of glucose in your blood within normal limits. The amount of carbohydrates allowed is different for every person. A dietitian can help you calculate the amount that is right for you. Once you know the amount of carbohydrates you can have, you can count the carbohydrates in the foods you want to eat. Carbohydrates are found in the following foods:  Grains, such as breads and cereals.  Dried beans and soy products.  Starchy vegetables, such as potatoes, peas, and corn.  Fruit and fruit juices.  Milk and yogurt.  Sweets and snack foods, such as cake, cookies, candy, chips, soft drinks, and fruit drinks. CARBOHYDRATE COUNTING There are two ways to count the carbohydrates in your food. You can use either of the methods or a combination of both. Reading the "Nutrition Facts" on Packaged Food The "Nutrition Facts" is an area that is included on the labels of almost all packaged food and beverages in the [place]. It includes the serving size of that food or beverage and information about the nutrients in each serving of the food, including the grams (g) of carbohydrate per serving.  Decide the number of servings of this food or beverage that you will be able to eat or drink. Multiply that number of servings by the number of grams of carbohydrate  that is listed on the label for that serving. The total will be the amount of carbohydrates you will be having when you eat or drink this food or beverage. Learning Standard Serving Sizes of Food When you eat food that is not packaged or does not include "Nutrition Facts" on the label, you need to measure the servings in order to count the amount of carbohydrates.A serving of most carbohydrate-rich foods contains about 15 g of carbohydrates. The following list includes serving sizes of carbohydrate-rich foods that provide 15 g ofcarbohydrate per serving:   1 slice of bread (1 oz) or 1 six-inch tortilla.    of a hamburger bun or English muffin.  4-6 crackers.   cup unsweetened dry cereal.    cup hot cereal.   cup rice or pasta.    cup mashed potatoes or  of a large baked potato.  1 cup fresh fruit or one small piece of fruit.    cup canned or frozen fruit or fruit juice.  1 cup milk.   cup plain fat-free yogurt or yogurt sweetened with artificial sweeteners.   cup cooked dried beans or starchy vegetable, such as peas, corn, or potatoes.  Decide the number of standard-size servings that you will eat. Multiply that number of servings   by 15 (the grams of carbohydrates in that serving). For example, if you eat 2 cups of strawberries, you will have eaten 2 servings and 30 g of carbohydrates (2 servings x 15 g = 30 g). For foods such as soups and casseroles, in which more than one food is mixed in, you will need to count the carbohydrates in each food that is included. EXAMPLE OF CARBOHYDRATE COUNTING Sample Dinner  3 oz chicken breast.   cup of brown rice.   cup of corn.  1 cup milk.   1 cup strawberries with sugar-free whipped topping.  Carbohydrate Calculation Step 1: Identify the foods that contain carbohydrates:   Rice.   Corn.   Milk.   Strawberries. Step 2:Calculate the number of servings eaten of each:   2 servings of rice.   1 serving  of corn.   1 serving of milk.   1 serving of strawberries. Step 3: Multiply each of those number of servings by 15 g:   2 servings of rice x 15 g = 30 g.   1 serving of corn x 15 g = 15 g.   1 serving of milk x 15 g = 15 g.   1 serving of strawberries x 15 g = 15 g. Step 4: Add together all of the amounts to find the total grams of carbohydrates eaten: 30 g + 15 g + 15 g + 15 g = 75 g. Document Released: 01/04/2005 Document Revised: 05/21/2013 Document Reviewed: 12/01/2012 [facility] Patient Information 2015 [place], [place]. This information is not intended to replace advice given to you by your health care provider. Make sure you discuss any questions you have with your health care provider. Diabetes and Exercise Exercising regularly is important. It is not just about losing weight. It has many health benefits, such as:  Improving your overall fitness, flexibility, and endurance.  Increasing your bone density.  Helping with weight control.  Decreasing your body fat.  Increasing your muscle strength.  Reducing stress and tension.  Improving your overall health. People with diabetes who exercise gain additional benefits because exercise:  Reduces appetite.  Improves the body's use of blood sugar (glucose).  Helps lower or control blood glucose.  Decreases blood pressure.  Helps control blood lipids (such as cholesterol and triglycerides).  Improves the body's use of the hormone insulin by:  Increasing the body's insulin sensitivity.  Reducing the body's insulin needs.  Decreases the risk for heart disease because exercising:  Lowers cholesterol and triglycerides levels.  Increases the levels of good cholesterol (such as high-density lipoproteins [HDL]) in the body.  Lowers blood glucose levels. YOUR ACTIVITY PLAN  Choose an activity that you enjoy and set realistic goals. Your health care provider or diabetes educator can help you make an activity plan  that works for you. Exercise regularly as directed by your health care provider. This includes:  Performing resistance training twice a week such as push-ups, sit-ups, lifting weights, or using resistance bands.  Performing 150 minutes of cardio exercises each week such as walking, running, or playing sports.  Staying active and spending no more than 90 minutes at one time being inactive. Even short bursts of exercise are good for you. Three 10-minute sessions spread throughout the day are just as beneficial as a single 30-minute session. Some exercise ideas include:  Taking the dog for a walk.  Taking the stairs instead of the elevator.  Dancing to your favorite song.  Doing an exercise video.  Doing  your favorite exercise with a friend. RECOMMENDATIONS FOR EXERCISING WITH TYPE 1 OR TYPE 2 DIABETES   Check your blood glucose before exercising. If blood glucose levels are greater than 240 mg/dL, check for urine ketones. Do not exercise if ketones are present.  Avoid injecting insulin into areas of the body that are going to be exercised. For example, avoid injecting insulin into:  The arms when playing tennis.  The legs when jogging.  Keep a record of:  Food intake before and after you exercise.  Expected peak times of insulin action.  Blood glucose levels before and after you exercise.  The type and amount of exercise you have done.  Review your records with your health care provider. Your health care provider will help you to develop guidelines for adjusting food intake and insulin amounts before and after exercising.  If you take insulin or oral hypoglycemic agents, watch for signs and symptoms of hypoglycemia. They include:  Dizziness.  Shaking.  Sweating.  Chills.  Confusion.  Drink plenty of water while you exercise to prevent dehydration or heat stroke. Body water is lost during exercise and must be replaced.  Talk to your health care provider before  starting an exercise program to make sure it is safe for you. Remember, almost any type of activity is better than none. Document Released: 03/27/2003 Document Revised: 05/21/2013 Document Reviewed: 06/13/2012 [facility] Patient Information 2015 [place], [place]. This information is not intended to replace advice given to you by your health care provider. Make sure you discuss any questions you have with your health care provider.

## 2014-08-29 NOTE — Progress Notes (Signed)
Pt's here for follow up and medical clearance to remove gallbladder. Physical for DMV for license. Pt has not taken meds in the last 2-3 days. Pt having pain on R side due to gallbladder x17mo constant pain Pain level 6/10.

## 2014-08-29 NOTE — Progress Notes (Signed)
Patient ID: Lee Reid, male   DOB: Apr 14, 1955, 59 y.o.   MRN: [id number]   Kazim Corrales, is a 59 y.o. male  [id number]  [id number]  DOB - December 20, 1955  Chief Complaint  Patient presents with  . Follow-up        Subjective:   Lee Reid is a 59 y.o. male here today for a follow up visit. Patient has type 2 diabetes mellitus diagnosed around year 2010 and has since been on glipizide and metformin, recently admitted [place] but patient prefers Invokana, patient has no known complication of diabetes mellitus. He was recently diagnosed with gallbladder disease without cholecystitis and has been scheduled for laparoscopic cholecystectomy and intraoperative cholangiogram. Patient is here today for medical clearance to remove gallbladder and also physical for DMV for license. Pt has not taken meds in the last 2-3 days. Pt having pain on R side due to gallbladder x 56mo constant pain, Pain level 6/10. Patient has No headache, No chest pain, No Nausea, No new weakness tingling or numbness, No Cough - SOB.  Problem  Preoperative Clearance  Type 2 Diabetes Mellitus Without Complication    ALLERGIES: No Known Allergies  PAST MEDICAL HISTORY: Past Medical History  Diagnosis Date  . Diabetes mellitus without complication     MEDICATIONS AT HOME: Prior to Admission medications   Medication Sig Start Date End Date Taking? Authorizing Provider  glipiZIDE (GLUCOTROL XL) 5 MG 24 hr tablet Take 1 tablet (5 mg total) by mouth daily with breakfast. 08/02/14  Yes Tresa Garter, MD  metFORMIN (GLUCOPHAGE) 1000 MG tablet Take 1 tablet (1,000 mg total) by mouth 2 (two) times daily with a meal. 08/02/14  Yes Tresa Garter, MD  Multiple Vitamin (MULTIVITAMIN WITH MINERALS) TABS tablet Take 1 tablet by mouth daily.   Yes Historical Provider, MD  canagliflozin (INVOKANA) 100 MG TABS tablet Take 1 tablet (100 mg total) by mouth daily. 08/29/14   Tresa Garter, MD  cyclobenzaprine  (FLEXERIL) 10 MG tablet Take 1 tablet (10 mg total) by mouth 3 (three) times daily as needed for muscle spasms. Patient not taking: Reported on 07/29/2014 09/23/13   Hazel Sams, PA-C  enalapril (VASOTEC) 2.5 MG tablet Take 1 tablet (2.5 mg total) by mouth daily. Patient not taking: Reported on 08/27/2014 08/02/14   Tresa Garter, MD  ibuprofen (ADVIL,MOTRIN) 800 MG tablet Take 1 tablet (800 mg total) by mouth 3 (three) times daily. Patient not taking: Reported on 07/29/2014 09/23/13   Hazel Sams, PA-C  ondansetron (ZOFRAN ODT) 4 MG disintegrating tablet 4mg  ODT q4 hours prn nausea/vomit Patient not taking: Reported on 08/27/2014 08/20/14   Malvin Johns, MD  oxyCODONE-acetaminophen (PERCOCET) 5-325 MG per tablet Take 1-2 tablets by mouth every 4 (four) hours as needed. Patient not taking: Reported on 08/27/2014 08/20/14   Malvin Johns, MD  penicillin v potassium (VEETID) 500 MG tablet Take 1 tablet (500 mg total) by mouth 3 (three) times daily. Patient not taking: Reported on 08/27/2014 08/20/14   Malvin Johns, MD     Objective:   Filed Vitals:   08/29/14 1109  BP: 111/75  Pulse: 80  Temp: 98.5 F (36.9 C)  TempSrc: Oral  Resp: 18  Height: 6\' 1"  (1.854 m)  Weight: 306 lb (138.801 kg)  SpO2: 95%    Exam General appearance : Awake, alert, not in any distress. Speech Clear. Not toxic looking, morbidly obese HEENT: Atraumatic and Normocephalic, pupils equally reactive to light and accomodation Neck: supple, no JVD. No cervical  lymphadenopathy.  Chest:Good air entry bilaterally, no added sounds  CVS: S1 S2 regular, no murmurs.  Abdomen: Bowel sounds present, Non tender and not distended with no gaurding, rigidity or rebound. Extremities: B/L Lower Ext shows no edema, both legs are warm to touch Neurology: Awake alert, and oriented X 3, CN II-XII intact, Non focal Skin:No Rash  Data Review Lab Results  Component Value Date   HGBA1C 13.0 07/29/2014     Assessment & Plan   1. Type 2  diabetes mellitus without complication  - Glucose (CBG) - canagliflozin (INVOKANA) 100 MG TABS tablet; Take 1 tablet (100 mg total) by mouth daily.  Dispense: 30 tablet; Refill: 3  Aim for 30 minutes of exercise most days. Rethink what you drink. Water is great! Aim for 2-3 Carb Choices per meal (30-45 grams) +/- 1 either way  Aim for 0-15 Carbs per snack if hungry  Include protein in moderation with your meals and snacks  Consider reading food labels for Total Carbohydrate and Fat Grams of foods  Consider checking BG at alternate times per day  Continue taking medication as directed Be mindful about how much sugar you are adding to beverages and other foods. Try to decrease. Consider splenda. Fruit Punch - find one with no sugar  Measure and decrease portions of carbohydrate foods  Make your plate and don't go back for seconds   2. Morbid obesity Patient have been counseled extensively about nutrition and exercise  3. Preoperative clearance Patient has no medical contraindication to cholecystectomy. He is hereby cleared for surgery.  Smoking The patient was counseled on the dangers of tobacco use, and was advised to quit. Reviewed strategies to maximize success, including removing cigarettes and smoking materials from environment, stress management and support of family/friends.    Return in about 2 months (around 10/29/2014) for Hemoglobin A1C and Follow up, DM, Follow up HTN, Follow up Pain and comorbidities.  The patient was given clear instructions to go to ER or return to medical center if symptoms don't improve, worsen or new problems develop. The patient verbalized understanding. The patient was told to call to get lab results if they haven't heard anything in the next week.   This note has been created with Surveyor, quantity. Any transcriptional errors are unintentional.    Angelica Chessman, MD, [place], [place], [place], [place] and [facility] [place], [place]   08/29/2014, 11:52 AM

## 2014-09-03 DIAGNOSIS — H3522 Other non-diabetic proliferative retinopathy, left eye: Secondary | ICD-10-CM | POA: Diagnosis not present

## 2014-09-03 DIAGNOSIS — H4423 Degenerative myopia, bilateral: Secondary | ICD-10-CM | POA: Diagnosis not present

## 2014-09-04 ENCOUNTER — Encounter: Payer: Self-pay | Admitting: Internal Medicine

## 2014-09-10 ENCOUNTER — Ambulatory Visit: Payer: Self-pay | Admitting: General Surgery

## 2014-09-13 ENCOUNTER — Encounter (HOSPITAL_COMMUNITY): Payer: Self-pay

## 2014-09-13 ENCOUNTER — Encounter (HOSPITAL_COMMUNITY)
Admission: RE | Admit: 2014-09-13 | Discharge: 2014-09-13 | Disposition: A | Discharge status: Home / Self Care | Payer: Medicare Other | Source: Ambulatory Visit | Attending: General Surgery | Admitting: General Surgery

## 2014-09-13 ENCOUNTER — Other Ambulatory Visit (HOSPITAL_COMMUNITY): Payer: Medicare Other

## 2014-09-13 DIAGNOSIS — Z79899 Other long term (current) drug therapy: Secondary | ICD-10-CM | POA: Diagnosis not present

## 2014-09-13 DIAGNOSIS — E119 Type 2 diabetes mellitus without complications: Secondary | ICD-10-CM | POA: Diagnosis not present

## 2014-09-13 DIAGNOSIS — Z23 Encounter for immunization: Secondary | ICD-10-CM | POA: Diagnosis not present

## 2014-09-13 DIAGNOSIS — Z6841 Body Mass Index (BMI) 40.0 and over, adult: Secondary | ICD-10-CM | POA: Diagnosis not present

## 2014-09-13 DIAGNOSIS — F17200 Nicotine dependence, unspecified, uncomplicated: Secondary | ICD-10-CM | POA: Diagnosis not present

## 2014-09-13 DIAGNOSIS — K811 Chronic cholecystitis: Secondary | ICD-10-CM | POA: Diagnosis present

## 2014-09-13 DIAGNOSIS — K8010 Calculus of gallbladder with chronic cholecystitis without obstruction: Secondary | ICD-10-CM | POA: Diagnosis not present

## 2014-09-13 HISTORY — DX: Cardiac arrhythmia, unspecified: I49.9

## 2014-09-13 HISTORY — DX: Angina pectoris, unspecified: I20.9

## 2014-09-13 HISTORY — DX: Cardiac murmur, unspecified: R01.1

## 2014-09-13 HISTORY — DX: Depression, unspecified: F32.A

## 2014-09-13 HISTORY — DX: Anxiety disorder, unspecified: F41.9

## 2014-09-13 HISTORY — DX: Pneumonia, unspecified organism: J18.9

## 2014-09-13 HISTORY — DX: Unspecified asthma, uncomplicated: J45.909

## 2014-09-13 HISTORY — DX: Major depressive disorder, single episode, unspecified: F32.9

## 2014-09-13 HISTORY — DX: Gastro-esophageal reflux disease without esophagitis: K21.9

## 2014-09-13 NOTE — Progress Notes (Signed)
08-20-14 - ABD Acute w/1V Chest - EPIC 08-20-14 - U/U ABD - EPIC 08-20-14 - CMP - EPIC 08-20-14 -CBC w/diff - EPIC 08-20-14 - UA/micro - EPIC 08-29-14 - LOV Dr. Doreene Burke (int.med.) - EPIC

## 2014-09-13 NOTE — Patient Instructions (Addendum)
SOLOMON SKOWRONEK  09/13/2014   Your procedure is scheduled on:  September 16, 2014  Report to [facility] Main  Entrance take [place]  elevators to 3rd floor to  [place] at 12:15 PM.  Call this number if you have problems the morning of surgery [phone]   Remember: ONLY 1 PERSON MAY GO WITH YOU TO SHORT STAY TO GET  READY MORNING OF [place].  Do not eat food after midnight Sunday night.  Follow clear liquid diet after midnight Sunday night until Monday morning at 7:30 am.  Then nothing by mouth after Monday 7:30 am.  CLEAR LIQUID DIET   Foods Allowed                                                                     Foods Excluded  Coffee and tea, regular and decaf                             liquids that you cannot  Plain Jell-O in any flavor                                             see through such as: Fruit ices (not with fruit pulp)                                     milk, soups, orange juice  Iced Popsicles                                    All solid food Carbonated beverages, regular and diet                                    Cranberry, grape and apple juices Sports drinks like Gatorade Lightly seasoned clear broth or consume(fat free) Sugar, honey syrup  Sample Menu Breakfast                                Lunch                                     Supper Cranberry juice                    Beef broth                            Chicken broth Jell-O                                     Grape juice  Apple juice Coffee or tea                        Jell-O                                      Popsicle                                                Coffee or tea                        Coffee or tea  _____________________________________________________________________      Take these medicines the morning of surgery with A SIP OF WATER:  None                               You may not have any metal on your body  including hair pins and              piercings  Do not wear jewelry,  lotions, powders or perfumes, deodorant                   Men may shave face and neck.   Do not bring valuables to the hospital. [place].  Contacts, dentures or bridgework may not be worn into surgery.  Leave suitcase in the car. After surgery it may be brought to your room.        Special Instructions:  Coughing and deep breathing exercises              Please read over the following fact sheets you were given: _____________________________________________________________________             [facility] - Preparing for Surgery Before surgery, you can play an important role.  Because skin is not sterile, your skin needs to be as free of germs as possible.  You can reduce the number of germs on your skin by washing with CHG (chlorahexidine gluconate) soap before surgery.  CHG is an antiseptic cleaner which kills germs and bonds with the skin to continue killing germs even after washing. Please DO NOT use if you have an allergy to CHG or antibacterial soaps.  If your skin becomes reddened/irritated stop using the CHG and inform your nurse when you arrive at Short Stay. Do not shave (including legs and underarms) for at least 48 hours prior to the first CHG shower.  You may shave your face/neck. Please follow these instructions carefully:  1.  Shower with CHG Soap the night before surgery and the  morning of Surgery.  2.  If you choose to wash your hair, wash your hair first as usual with your  normal  shampoo.  3.  After you shampoo, rinse your hair and body thoroughly to remove the  shampoo.                           4.  Use CHG as you would any other liquid soap.  You can apply chg directly  to  the skin and wash                       Gently with a scrungie or clean washcloth.  5.  Apply the CHG Soap to your body ONLY FROM THE NECK DOWN.   Do not use on face/ open                            Wound or open sores. Avoid contact with eyes, ears mouth and genitals (private parts).                       Wash face,  Genitals (private parts) with your normal soap.             6.  Wash thoroughly, paying special attention to the area where your surgery  will be performed.  7.  Thoroughly rinse your body with warm water from the neck down.  8.  DO NOT shower/wash with your normal soap after using and rinsing off  the CHG Soap.                9.  Pat yourself dry with a clean towel.            10.  Wear clean pajamas.            11.  Place clean sheets on your bed the night of your first shower and do not  sleep with pets. Day of Surgery : Do not apply any lotions/deodorants the morning of surgery.  Please wear clean clothes to the hospital/surgery center.  FAILURE TO FOLLOW THESE INSTRUCTIONS MAY RESULT IN THE CANCELLATION OF YOUR SURGERY PATIENT SIGNATURE_________________________________  NURSE SIGNATURE__________________________________  ________________________________________________________________________

## 2014-09-15 MED ORDER — DEXTROSE 5 % IV SOLN
3.0000 g | INTRAVENOUS | Status: AC
  Administered 2014-09-16: 3 g via INTRAVENOUS
  Filled 2014-09-15: qty 3000

## 2014-09-16 ENCOUNTER — Ambulatory Visit (HOSPITAL_COMMUNITY): Payer: Medicare Other | Admitting: Certified Registered"

## 2014-09-16 ENCOUNTER — Observation Stay (HOSPITAL_COMMUNITY)
Admission: RE | Admit: 2014-09-16 | Discharge: 2014-09-17 | Disposition: A | Discharge status: Home / Self Care | Payer: Medicare Other | Source: Ambulatory Visit | Attending: General Surgery | Admitting: General Surgery

## 2014-09-16 ENCOUNTER — Encounter (HOSPITAL_COMMUNITY): Payer: Self-pay | Admitting: *Deleted

## 2014-09-16 ENCOUNTER — Encounter (HOSPITAL_COMMUNITY)
Admission: RE | Disposition: A | Discharge status: Home / Self Care | Payer: Self-pay | Source: Ambulatory Visit | Attending: General Surgery

## 2014-09-16 DIAGNOSIS — Z79899 Other long term (current) drug therapy: Secondary | ICD-10-CM | POA: Diagnosis not present

## 2014-09-16 DIAGNOSIS — K811 Chronic cholecystitis: Secondary | ICD-10-CM | POA: Diagnosis present

## 2014-09-16 DIAGNOSIS — E119 Type 2 diabetes mellitus without complications: Secondary | ICD-10-CM | POA: Insufficient documentation

## 2014-09-16 DIAGNOSIS — K219 Gastro-esophageal reflux disease without esophagitis: Secondary | ICD-10-CM | POA: Diagnosis not present

## 2014-09-16 DIAGNOSIS — K8010 Calculus of gallbladder with chronic cholecystitis without obstruction: Secondary | ICD-10-CM | POA: Diagnosis not present

## 2014-09-16 DIAGNOSIS — Z23 Encounter for immunization: Secondary | ICD-10-CM | POA: Diagnosis not present

## 2014-09-16 DIAGNOSIS — Z6841 Body Mass Index (BMI) 40.0 and over, adult: Secondary | ICD-10-CM | POA: Insufficient documentation

## 2014-09-16 DIAGNOSIS — K8020 Calculus of gallbladder without cholecystitis without obstruction: Secondary | ICD-10-CM | POA: Diagnosis not present

## 2014-09-16 DIAGNOSIS — F17200 Nicotine dependence, unspecified, uncomplicated: Secondary | ICD-10-CM | POA: Diagnosis not present

## 2014-09-16 HISTORY — PX: CHOLECYSTECTOMY: SHX55

## 2014-09-16 LAB — GLUCOSE, CAPILLARY
Glucose-Capillary: 153 mg/dL — ABNORMAL HIGH (ref 65–99)
Glucose-Capillary: 166 mg/dL — ABNORMAL HIGH (ref 65–99)

## 2014-09-16 SURGERY — LAPAROSCOPIC CHOLECYSTECTOMY WITH INTRAOPERATIVE CHOLANGIOGRAM
Anesthesia: General

## 2014-09-16 MED ORDER — ONDANSETRON HCL 4 MG/2ML IJ SOLN
INTRAMUSCULAR | Status: DC | PRN
Start: 2014-09-16 — End: 2014-09-16
  Administered 2014-09-16: 4 mg via INTRAVENOUS

## 2014-09-16 MED ORDER — ONDANSETRON HCL 4 MG/2ML IJ SOLN: 4.0000 mg | Freq: Four times a day (QID) | INTRAMUSCULAR | Status: DC | PRN

## 2014-09-16 MED ORDER — BUPIVACAINE-EPINEPHRINE 0.25% -1:200000 IJ SOLN
INTRAMUSCULAR | Status: AC
Start: 2014-09-16 — End: 2014-09-16
  Filled 2014-09-16: qty 1

## 2014-09-16 MED ORDER — MORPHINE SULFATE (PF) 4 MG/ML IV SOLN
4.0000 mg | INTRAVENOUS | Status: DC | PRN
  Administered 2014-09-17: 4 mg via INTRAVENOUS
  Filled 2014-09-16: qty 1

## 2014-09-16 MED ORDER — MIDAZOLAM HCL 2 MG/2ML IJ SOLN
INTRAMUSCULAR | Status: AC
Start: 2014-09-16 — End: 2014-09-16
  Filled 2014-09-16: qty 4

## 2014-09-16 MED ORDER — LACTATED RINGERS IV SOLN
INTRAVENOUS | Status: DC
  Administered 2014-09-16: 1000 mL via INTRAVENOUS

## 2014-09-16 MED ORDER — ESMOLOL HCL 10 MG/ML IV SOLN
INTRAVENOUS | Status: DC | PRN
  Administered 2014-09-16: 20 mg via INTRAVENOUS
  Administered 2014-09-16 (×2): 10 mg via INTRAVENOUS
  Administered 2014-09-16: 20 mg via INTRAVENOUS

## 2014-09-16 MED ORDER — LIDOCAINE HCL (CARDIAC) 20 MG/ML IV SOLN
INTRAVENOUS | Status: AC
  Filled 2014-09-16: qty 5

## 2014-09-16 MED ORDER — PROPOFOL 10 MG/ML IV BOLUS
INTRAVENOUS | Status: AC
  Filled 2014-09-16: qty 20

## 2014-09-16 MED ORDER — CYCLOBENZAPRINE HCL 10 MG PO TABS
10.0000 mg | ORAL_TABLET | Freq: Three times a day (TID) | ORAL | Status: DC | PRN
  Administered 2014-09-17: 10 mg via ORAL
  Filled 2014-09-16: qty 1

## 2014-09-16 MED ORDER — MIDAZOLAM HCL 5 MG/5ML IJ SOLN
INTRAMUSCULAR | Status: DC | PRN
  Administered 2014-09-16: 2 mg via INTRAVENOUS

## 2014-09-16 MED ORDER — ONDANSETRON HCL 4 MG/2ML IJ SOLN
INTRAMUSCULAR | Status: AC
  Filled 2014-09-16: qty 2

## 2014-09-16 MED ORDER — DEXTROSE-NACL 5-0.45 % IV SOLN
INTRAVENOUS | Status: DC
  Administered 2014-09-16: 100 mL/h via INTRAVENOUS
  Administered 2014-09-17: 04:00:00 via INTRAVENOUS

## 2014-09-16 MED ORDER — NEOSTIGMINE METHYLSULFATE 10 MG/10ML IV SOLN
INTRAVENOUS | Status: DC | PRN
  Administered 2014-09-16: 4 mg via INTRAVENOUS

## 2014-09-16 MED ORDER — GLYCOPYRROLATE 0.2 MG/ML IJ SOLN
INTRAMUSCULAR | Status: DC | PRN
  Administered 2014-09-16: 0.6 mg via INTRAVENOUS

## 2014-09-16 MED ORDER — ROCURONIUM BROMIDE 100 MG/10ML IV SOLN
INTRAVENOUS | Status: AC
Start: 2014-09-16 — End: 2014-09-16
  Filled 2014-09-16: qty 1

## 2014-09-16 MED ORDER — PROMETHAZINE HCL 25 MG/ML IJ SOLN: 6.2500 mg | INTRAMUSCULAR | Status: DC | PRN

## 2014-09-16 MED ORDER — INFLUENZA VAC SPLIT QUAD 0.5 ML IM SUSY
0.5000 mL | PREFILLED_SYRINGE | INTRAMUSCULAR | Status: AC
  Administered 2014-09-17: 0.5 mL via INTRAMUSCULAR
  Filled 2014-09-16 (×2): qty 0.5

## 2014-09-16 MED ORDER — LABETALOL HCL 5 MG/ML IV SOLN
10.0000 mg | INTRAVENOUS | Status: DC | PRN
  Administered 2014-09-16: 10 mg via INTRAVENOUS

## 2014-09-16 MED ORDER — HYDROMORPHONE HCL 1 MG/ML IJ SOLN
0.2500 mg | INTRAMUSCULAR | Status: DC | PRN
  Administered 2014-09-16 (×3): 0.5 mg via INTRAVENOUS

## 2014-09-16 MED ORDER — CEFAZOLIN SODIUM-DEXTROSE 2-3 GM-% IV SOLR
2.0000 g | Freq: Three times a day (TID) | INTRAVENOUS | Status: AC
  Administered 2014-09-16: 2 g via INTRAVENOUS
  Filled 2014-09-16: qty 50

## 2014-09-16 MED ORDER — HEPARIN SODIUM (PORCINE) 5000 UNIT/ML IJ SOLN
5000.0000 [IU] | Freq: Once | INTRAMUSCULAR | Status: AC
  Administered 2014-09-16: 5000 [IU] via SUBCUTANEOUS
  Filled 2014-09-16: qty 1

## 2014-09-16 MED ORDER — HYDROMORPHONE HCL 2 MG/ML IJ SOLN
INTRAMUSCULAR | Status: AC
  Filled 2014-09-16: qty 1

## 2014-09-16 MED ORDER — HEPARIN SODIUM (PORCINE) 5000 UNIT/ML IJ SOLN
5000.0000 [IU] | Freq: Three times a day (TID) | INTRAMUSCULAR | Status: DC
  Administered 2014-09-16 – 2014-09-17 (×2): 5000 [IU] via SUBCUTANEOUS
  Filled 2014-09-16 (×5): qty 1

## 2014-09-16 MED ORDER — NEOSTIGMINE METHYLSULFATE 10 MG/10ML IV SOLN
INTRAVENOUS | Status: AC
  Filled 2014-09-16: qty 1

## 2014-09-16 MED ORDER — 0.9 % SODIUM CHLORIDE (POUR BTL) OPTIME
TOPICAL | Status: DC | PRN
  Administered 2014-09-16: 1000 mL

## 2014-09-16 MED ORDER — LABETALOL HCL 5 MG/ML IV SOLN
INTRAVENOUS | Status: DC | PRN
  Administered 2014-09-16 (×4): 5 mg via INTRAVENOUS

## 2014-09-16 MED ORDER — ENALAPRIL MALEATE 2.5 MG PO TABS: 2.5000 mg | ORAL_TABLET | Freq: Every day | ORAL | Status: DC

## 2014-09-16 MED ORDER — LABETALOL HCL 5 MG/ML IV SOLN
INTRAVENOUS | Status: AC
  Filled 2014-09-16: qty 4

## 2014-09-16 MED ORDER — LACTATED RINGERS IV SOLN
INTRAVENOUS | Status: DC | PRN
  Administered 2014-09-16 (×2): via INTRAVENOUS

## 2014-09-16 MED ORDER — HYDROMORPHONE HCL 1 MG/ML IJ SOLN
INTRAMUSCULAR | Status: DC | PRN
  Administered 2014-09-16 (×2): 1 mg via INTRAVENOUS

## 2014-09-16 MED ORDER — FENTANYL CITRATE (PF) 100 MCG/2ML IJ SOLN
INTRAMUSCULAR | Status: DC | PRN
  Administered 2014-09-16 (×3): 50 ug via INTRAVENOUS
  Administered 2014-09-16: 100 ug via INTRAVENOUS

## 2014-09-16 MED ORDER — LACTATED RINGERS IR SOLN
Status: DC | PRN
  Administered 2014-09-16: 2000 mL

## 2014-09-16 MED ORDER — HYDROMORPHONE HCL 1 MG/ML IJ SOLN
INTRAMUSCULAR | Status: AC
  Filled 2014-09-16: qty 1

## 2014-09-16 MED ORDER — GLYCOPYRROLATE 0.2 MG/ML IJ SOLN
INTRAMUSCULAR | Status: AC
  Filled 2014-09-16: qty 3

## 2014-09-16 MED ORDER — IBUPROFEN 200 MG PO TABS: 600.0000 mg | ORAL_TABLET | Freq: Four times a day (QID) | ORAL | Status: DC | PRN

## 2014-09-16 MED ORDER — BUPIVACAINE-EPINEPHRINE 0.25% -1:200000 IJ SOLN
INTRAMUSCULAR | Status: DC | PRN
  Administered 2014-09-16: 28 mL

## 2014-09-16 MED ORDER — ESMOLOL HCL 10 MG/ML IV SOLN
INTRAVENOUS | Status: AC
  Filled 2014-09-16: qty 10

## 2014-09-16 MED ORDER — OXYCODONE-ACETAMINOPHEN 5-325 MG PO TABS
1.0000 | ORAL_TABLET | ORAL | Status: DC | PRN
  Administered 2014-09-16 – 2014-09-17 (×3): 2 via ORAL
  Filled 2014-09-16 (×3): qty 2

## 2014-09-16 MED ORDER — FENTANYL CITRATE (PF) 250 MCG/5ML IJ SOLN
INTRAMUSCULAR | Status: AC
Start: 2014-09-16 — End: 2014-09-16
  Filled 2014-09-16: qty 25

## 2014-09-16 MED ORDER — ONDANSETRON 4 MG PO TBDP
4.0000 mg | ORAL_TABLET | Freq: Four times a day (QID) | ORAL | Status: DC | PRN
  Administered 2014-09-16: 4 mg via ORAL
  Filled 2014-09-16: qty 1

## 2014-09-16 SURGICAL SUPPLY — 39 items
APPLIER CLIP 5 13 M/L LIGAMAX5 (MISCELLANEOUS) ×2
BLADE CLIPPER SURG (BLADE) ×2 IMPLANT
CHLORAPREP W/TINT 26ML (MISCELLANEOUS) ×4 IMPLANT
CLIP APPLIE 5 13 M/L LIGAMAX5 (MISCELLANEOUS) ×1 IMPLANT
COVER MAYO STAND STRL (DRAPES) IMPLANT
COVER SURGICAL LIGHT HANDLE (MISCELLANEOUS) ×2 IMPLANT
CUTTER FLEX LINEAR 45M (STAPLE) IMPLANT
DECANTER SPIKE VIAL GLASS SM (MISCELLANEOUS) ×2 IMPLANT
DEVICE SECURE STRAP 25 ABSORB (INSTRUMENTS) IMPLANT
DRAIN CHANNEL 19F RND (DRAIN) IMPLANT
DRAPE C-ARM 42X120 X-RAY (DRAPES) ×2 IMPLANT
DRAPE LAPAROSCOPIC ABDOMINAL (DRAPES) ×2 IMPLANT
ELECT REM PT RETURN 9FT ADLT (ELECTROSURGICAL) ×2
ELECTRODE REM PT RTRN 9FT ADLT (ELECTROSURGICAL) ×1 IMPLANT
EVACUATOR SILICONE 100CC (DRAIN) IMPLANT
GLOVE BIO SURGEON STRL SZ7 (GLOVE) ×2 IMPLANT
GLOVE BIOGEL PI IND STRL 7.0 (GLOVE) ×1 IMPLANT
GLOVE BIOGEL PI INDICATOR 7.0 (GLOVE) ×1
GOWN STRL REUS W/TWL LRG LVL3 (GOWN DISPOSABLE) ×4 IMPLANT
GOWN STRL REUS W/TWL XL LVL3 (GOWN DISPOSABLE) ×4 IMPLANT
KIT BASIN OR (CUSTOM PROCEDURE TRAY) ×2 IMPLANT
LIQUID BAND (GAUZE/BANDAGES/DRESSINGS) ×2 IMPLANT
PEN SKIN MARKING BROAD (MISCELLANEOUS) ×2 IMPLANT
POUCH RETRIEVAL ECOSAC 10 (ENDOMECHANICALS) ×1 IMPLANT
POUCH RETRIEVAL ECOSAC 10MM (ENDOMECHANICALS) ×1
RELOAD 45 VASCULAR/THIN (ENDOMECHANICALS) IMPLANT
RELOAD STAPLE TA45 3.5 REG BLU (ENDOMECHANICALS) IMPLANT
SCISSORS LAP 5X35 DISP (ENDOMECHANICALS) ×2 IMPLANT
SET IRRIG TUBING LAPAROSCOPIC (IRRIGATION / IRRIGATOR) ×2 IMPLANT
SHEARS HARMONIC ACE PLUS 36CM (ENDOMECHANICALS) IMPLANT
SLEEVE XCEL OPT CAN 5 100 (ENDOMECHANICALS) ×4 IMPLANT
SUT ETHILON 2 0 PS N (SUTURE) IMPLANT
SUT MNCRL AB 4-0 PS2 18 (SUTURE) ×2 IMPLANT
SUT VICRYL 0 ENDOLOOP (SUTURE) IMPLANT
SYSTEM CARTER THOMASON II (TROCAR) ×2 IMPLANT
TOWEL OR 17X26 10 PK STRL BLUE (TOWEL DISPOSABLE) ×2 IMPLANT
TRAY LAPAROSCOPIC (CUSTOM PROCEDURE TRAY) ×2 IMPLANT
TROCAR BLADELESS OPT 5 100 (ENDOMECHANICALS) ×2 IMPLANT
TROCAR XCEL 12X100 BLDLESS (ENDOMECHANICALS) ×2 IMPLANT

## 2014-09-16 NOTE — Anesthesia Procedure Notes (Signed)
Procedure Name: Intubation Date/Time: 09/16/2014 2:38 PM Performed by: Noralyn Pick D Pre-anesthesia Checklist: Patient identified, Emergency Drugs available, Suction available and Patient being monitored Patient Re-evaluated:Patient Re-evaluated prior to inductionOxygen Delivery Method: Circle System Utilized Preoxygenation: Pre-oxygenation with 100% oxygen Intubation Type: IV induction Ventilation: Mask ventilation without difficulty Laryngoscope Size: Mac and 4 Grade View: Grade III Tube type: Oral Tube size: 7.5 mm Number of attempts: 1 Airway Equipment and Method: Stylet and Oral airway Placement Confirmation: ETT inserted through vocal cords under direct vision,  positive ETCO2 and breath sounds checked- equal and bilateral Secured at: 24 cm Tube secured with: Tape Dental Injury: Teeth and Oropharynx as per pre-operative assessment

## 2014-09-16 NOTE — Op Note (Signed)
Preoperative diagnosis: chronic cholecystitis  Postoperative diagnosis: Same   Procedure: laparoscopic cholecystectomy  Surgeon: Gurney Maxin, M.D.  Anesthesia: Gen.   Indications for procedure: Lee Reid is a 59 y.o. male with symptoms of RUQ pain, Nausea and Back pain consistent with gallbladder disease, Confirmed by Ultrasound.  Description of procedure: The patient was brought into the operative suite, placed supine. Anesthesia was administered with endotracheal tube. Patient was strapped in place and foot board was secured. All pressure points were offloaded by foam padding. The patient was prepped and draped in the usual sterile fashion.  Vertial incision was made through the umbilicus and a blunt 09OB trocar was placed into the fascial defect. Pneumoperitoneum was applied with high flow low pressure. 3 36mm trocars were placed, one in the subxiphoid space, 2 in the RUQ. All trocars sites were first anesthesized with 0.25% marcaine with epinephrine. Next the patient was placed in reverse trendelenberg. The gallbladder was retracted cephalad and lateral. The peritoneum was reflected off the infundibulum working lateral to medial.   The cystic duct and cystic artery were identified and further dissection revealed a critical view.  The cystic duct and cystic artery were doubly clipped and ligated.   The gallbladder was removed off the liver bed with cautery. The Gallbladder was placed in a specimen bag. The gallbladder fossa was irrigated and hemostasis was applied with cautery. The gallbladder was removed via the umbilicus. 0 vicryl was used to close the fascial defect. Pneumoperitoneum was removed, all trocar were removed. All incisions were closed with 4-0 monocryl subcuticular stitch. The patient woke from anesthesia and was brought to PACU in stable condition.  Findings: chronic cholecystitis, 1cm stone in gallbladder  Specimen: gallbldadder  Blood loss: 50cc  Local  anesthesia: 28cc 0.25% Marcaine w epinephrine  Complications: bile spillage  Gurney Maxin, M.D. General, Bariatric, & Minimally Invasive Surgery [facility]

## 2014-09-16 NOTE — Anesthesia Postprocedure Evaluation (Signed)
  Anesthesia Post-op Note  Patient: Lee Reid  Procedure(s) Performed: Procedure(s) (LRB): LAPAROSCOPIC CHOLECYSTECTOMY  (N/A)  Patient Location: PACU  Anesthesia Type: General  Level of Consciousness: awake and alert   Airway and Oxygen Therapy: Patient Spontanous Breathing  Post-op Pain: mild  Post-op Assessment: Post-op Vital signs reviewed, Patient's Cardiovascular Status Stable, Respiratory Function Stable, Patent Airway and No signs of Nausea or vomiting  Last Vitals:  Filed Vitals:   09/16/14 1713  BP: 126/72  Pulse: 78  Temp: 36.4 C  Resp: 16    Post-op Vital Signs: stable   Complications: No apparent anesthesia complications

## 2014-09-16 NOTE — Transfer of Care (Signed)
Immediate Anesthesia Transfer of Care Note  Patient: Lee Reid  Procedure(s) Performed: Procedure(s): LAPAROSCOPIC CHOLECYSTECTOMY  (N/A)  Patient Location: PACU  Anesthesia Type:General  Level of Consciousness: awake, alert  and oriented  Airway & Oxygen Therapy: Patient Spontanous Breathing and Patient connected to face mask oxygen  Post-op Assessment: Report given to RN and Post -op Vital signs reviewed and stable  Post vital signs: Reviewed and stable  Last Vitals:  Filed Vitals:   09/16/14 1150  BP: 123/87  Pulse: 98  Temp: 36.8 C  Resp: 18    Complications: No apparent anesthesia complications

## 2014-09-16 NOTE — Progress Notes (Signed)
PACU note----pt's blood pressure elevated; Dr. Delma Post notified, order rec'd, and med given

## 2014-09-16 NOTE — Anesthesia Preprocedure Evaluation (Addendum)
Anesthesia Evaluation  Patient identified by MRN, date of birth, ID band Patient awake    Reviewed: Allergy & Precautions, NPO status , Patient's Chart, lab work & pertinent test results  Airway Mallampati: II  TM Distance: >3 FB Neck ROM: Full    Dental  (+) Dental Advisory Given,    Pulmonary asthma , pneumonia -, resolved, Current Smoker,  breath sounds clear to auscultation  Pulmonary exam normal       Cardiovascular Exercise Tolerance: Good + angina Normal cardiovascular exam+ dysrhythmias + Valvular Problems/Murmurs Rhythm:Regular Rate:Normal  He states he does not have a cardiologist. He states he has gone to the hospital with chest pain in the past. He states that he has had 2 stress tests in [place] in the past the last being about 2 years ago. He states he has always passed the stress tests and a catheterization has never been recommended.  Cardiac clearance obtained 05-07-13 by Dr. Nils Pyle (found in [facility] chart)  Exercise tolerance >4 METS   Neuro/Psych PSYCHIATRIC DISORDERS Anxiety Depression negative neurological ROS     GI/Hepatic Neg liver ROS, GERD-  ,  Endo/Other  diabetes, Type 2, Oral Hypoglycemic AgentsMorbid obesity  Renal/GU negative Renal ROS  negative genitourinary   Musculoskeletal negative musculoskeletal ROS (+)   Abdominal (+) + obese,   Peds negative pediatric ROS (+)  Hematology negative hematology ROS (+)   Anesthesia Other Findings Some loose teeth. He is not sure which ones.  Reproductive/Obstetrics negative OB ROS                           Anesthesia Physical Anesthesia Plan  ASA: III  Anesthesia Plan: General   Post-op Pain Management:    Induction: Intravenous  Airway Management Planned: Oral ETT  Additional Equipment:   Intra-op Plan:   Post-operative Plan: Extubation in OR  Informed Consent: I have reviewed the patients History and  Physical, chart, labs and discussed the procedure including the risks, benefits and alternatives for the proposed anesthesia with the patient or authorized representative who has indicated his/her understanding and acceptance.   Dental advisory given  Plan Discussed with: CRNA  Anesthesia Plan Comments:         Anesthesia Quick Evaluation

## 2014-09-16 NOTE — H&P (Signed)
The patient is a 59 year old male who presents for evaluation of gall stones. The onset of the gall stones has been gradual and has been occurring in an intermittent pattern for 3 weeks. The course has been gradually worsening. The gall stones is described as moderate to severe. There has been associated nausea. Relieving factors include medication.   Other Problems Diabetes Mellitus Heart murmur  Past Surgical History  No pertinent past surgical history  Diagnostic Studies History  Colonoscopy 1-5 years ago  Allergies No Known Drug Allergies08/10/2014  Medication History  GlipiZIDE ER (5MG  Tablet ER 24HR, Oral) Active. MetFORMIN HCl (1000MG  Tablet, Oral) Active. Medications Reconciled  Social History Alcohol use Remotely quit alcohol use. Caffeine use Carbonated beverages. Illicit drug use Remotely quit drug use. Tobacco use Current every day smoker.  Family History  Alcohol Abuse Father. Heart Disease Father. Heart disease in male family member before age 35 Hypertension Father, Mother. Respiratory Condition Father.  Review of Systems  Respiratory Present- Chronic Cough. Not Present- Bloody sputum, Difficulty Breathing, Snoring and Wheezing. Gastrointestinal Present- Abdominal Pain. Not Present- Bloating, Bloody Stool, Change in Bowel Habits, Chronic diarrhea, Constipation, Difficulty Swallowing, Excessive gas, Gets full quickly at meals, Hemorrhoids, Indigestion, Nausea, Rectal Pain and Vomiting. Male Genitourinary Present- Change in Urinary Stream, Frequency, Impotence and Urine Leakage. Not Present- Blood in Urine, Nocturia, Painful Urination and Urgency. Musculoskeletal Present- Back Pain. Not Present- Joint Pain, Joint Stiffness, Muscle Pain, Muscle Weakness and Swelling of Extremities. Psychiatric Present- Anxiety. Not Present- Bipolar, Change in Sleep Pattern, Depression, Fearful and Frequent crying. Endocrine Present- Excessive Hunger, Heat  Intolerance and New Diabetes. Not Present- Cold Intolerance, Hair Changes and Hot flashes.  Filed Vitals:   09/16/14 1150  BP: 123/87  Pulse: 98  Temp: 98.2 F (36.8 C)  Resp: 18    Physical Exam General Mental Status-Alert. General Appearance-Consistent with stated age. Hydration-Well hydrated. Voice-Normal.  Head and Neck Head-normocephalic, atraumatic with no lesions or palpable masses.  Eye Eyeball - Bilateral-Extraocular movements intact. Sclera/Conjunctiva - Bilateral-No scleral icterus.  Chest and Lung Exam Chest and lung exam reveals -quiet, even and easy respiratory effort with no use of accessory muscles and on auscultation, normal breath sounds, no adventitious sounds and normal vocal resonance. Inspection Chest Wall - Normal. Back - normal.  Cardiovascular Cardiovascular examination reveals -on palpation PMI is normal in location and amplitude, no palpable S3 or S4. Normal cardiac borders., normal heart sounds, regular rate and rhythm with no murmurs, carotid auscultation reveals no bruits and normal pedal pulses bilaterally.  Abdomen Inspection Inspection of the abdomen reveals - No Hernias. Skin - Scar - no surgical scars. Palpation/Percussion Palpation and Percussion of the abdomen reveal - Soft, No Rebound tenderness, No Rigidity (guarding) and No hepatosplenomegaly. Tenderness - Right Upper Quadrant. Auscultation Auscultation of the abdomen reveals - Bowel sounds normal.  Neurologic Neurologic evaluation reveals -alert and oriented x 3 with no impairment of recent or remote memory. Mental Status-Normal.  Musculoskeletal Normal Exam - Left-Upper Extremity Strength Normal and Lower Extremity Strength Normal. Normal Exam - Right-Upper Extremity Strength Normal, Lower Extremity Weakness.    Assessment & Plan  SYMPTOMATIC CHOLELITHIASIS (574.20  K80.20) Current Plans  Started Percocet 5-325MG , 1 (one) Tablet every four  hours, as needed, #40, 08/28/2014, No Refill. PATIENT IS SCHEDULED FOR SURGICAL PROCEDURE (V49.89  Z78.9) Current Plans Pt Education - Gallstones *: cholelithiasis Note:Patient is good candidate for laparoscopic cholecystectomy. Risks and benefits were explained in detail at this time. Due to diabetes with noted  high sugars recently and possible undiagnosed sleep apnea, I have sent him back to his primary care provider for risk stratification and optimization prior to surgery. -PCP has said the Diabetes is under control and is minimal risk for this procedure.  Lurena Joiner Aleen Marston 09/16/2014

## 2014-09-17 ENCOUNTER — Encounter (HOSPITAL_COMMUNITY): Payer: Self-pay | Admitting: General Surgery

## 2014-09-17 DIAGNOSIS — K8010 Calculus of gallbladder with chronic cholecystitis without obstruction: Secondary | ICD-10-CM | POA: Diagnosis not present

## 2014-09-17 DIAGNOSIS — F17200 Nicotine dependence, unspecified, uncomplicated: Secondary | ICD-10-CM | POA: Diagnosis not present

## 2014-09-17 DIAGNOSIS — Z79899 Other long term (current) drug therapy: Secondary | ICD-10-CM | POA: Diagnosis not present

## 2014-09-17 DIAGNOSIS — E119 Type 2 diabetes mellitus without complications: Secondary | ICD-10-CM | POA: Diagnosis not present

## 2014-09-17 DIAGNOSIS — Z23 Encounter for immunization: Secondary | ICD-10-CM | POA: Diagnosis not present

## 2014-09-17 LAB — GLUCOSE, CAPILLARY
Glucose-Capillary: 189 mg/dL — ABNORMAL HIGH (ref 65–99)
Glucose-Capillary: 178 mg/dL — ABNORMAL HIGH (ref 65–99)

## 2014-09-17 MED ORDER — IBUPROFEN 600 MG PO TABS: 600.0000 mg | ORAL_TABLET | Freq: Four times a day (QID) | ORAL | Status: DC | PRN

## 2014-09-17 MED ORDER — METFORMIN HCL 500 MG PO TABS
1000.0000 mg | ORAL_TABLET | Freq: Two times a day (BID) | ORAL | Status: DC
  Administered 2014-09-17: 1000 mg via ORAL
  Filled 2014-09-17 (×3): qty 2

## 2014-09-17 MED ORDER — GLIPIZIDE ER 5 MG PO TB24
5.0000 mg | ORAL_TABLET | Freq: Every day | ORAL | Status: DC
  Administered 2014-09-17: 5 mg via ORAL
  Filled 2014-09-17 (×2): qty 1

## 2014-09-17 MED ORDER — OXYCODONE-ACETAMINOPHEN 5-325 MG PO TABS: 1.0000 | ORAL_TABLET | ORAL | Status: DC | PRN

## 2014-09-17 NOTE — Discharge Summary (Signed)
Physician Discharge Summary  Patient ID: MAZIN EMMA MRN: [id number] DOB/AGE: 07/04/1955 59 y.o.  Admit date: 09/16/2014 Discharge date: 09/17/2014  Admission Diagnoses:  Discharge Diagnoses:  Active Problems:   Chronic cholecystitis   Discharged Condition: good  Hospital Course: Pt underwent lap chole, he was admitted observation post op to monitor glc and cardiac risk. He did well, tolerated low fat diet, glc stable. He was discharged POD 1  Consults: None  Significant Diagnostic Studies: labs: glc 189  Treatments: IV hydration  Discharge Exam: Blood pressure 107/70, pulse 79, temperature 99.6 F (37.6 C), temperature source Oral, resp. rate 16, height 6\' 1"  (1.854 m), weight 138.801 kg (306 lb), SpO2 96 %. General appearance: alert and cooperative Head: Normocephalic, without obvious abnormality, atraumatic Neck: no adenopathy, no carotid bruit, no JVD, supple, symmetrical, trachea midline and thyroid not enlarged, symmetric, no tenderness/mass/nodules Back: symmetric, no curvature. ROM normal. No CVA tenderness. Resp: clear to auscultation bilaterally Cardio: regular rate and rhythm, S1, S2 normal, no murmur, click, rub or gallop GI: incisions c/d/i  Disposition: 01-Home or Self Care     Medication List    STOP taking these medications        ondansetron 4 MG disintegrating tablet  Commonly known as:  ZOFRAN ODT     penicillin v potassium 500 MG tablet  Commonly known as:  VEETID      TAKE these medications        canagliflozin 100 MG Tabs tablet  Commonly known as:  INVOKANA  Take 1 tablet (100 mg total) by mouth daily.     cyclobenzaprine 10 MG tablet  Commonly known as:  FLEXERIL  Take 1 tablet (10 mg total) by mouth 3 (three) times daily as needed for muscle spasms.     enalapril 2.5 MG tablet  Commonly known as:  VASOTEC  Take 1 tablet (2.5 mg total) by mouth daily.     glipiZIDE 5 MG 24 hr tablet  Commonly known as:  GLUCOTROL XL  Take 1  tablet (5 mg total) by mouth daily with breakfast.     ibuprofen 200 MG tablet  Commonly known as:  ADVIL,MOTRIN  Take 400 mg by mouth every 6 (six) hours as needed for moderate pain.     ibuprofen 800 MG tablet  Commonly known as:  ADVIL,MOTRIN  Take 1 tablet (800 mg total) by mouth 3 (three) times daily.     ibuprofen 600 MG tablet  Commonly known as:  ADVIL,MOTRIN  Take 1 tablet (600 mg total) by mouth every 6 (six) hours as needed for mild pain.     metFORMIN 1000 MG tablet  Commonly known as:  GLUCOPHAGE  Take 1 tablet (1,000 mg total) by mouth 2 (two) times daily with a meal.     oxyCODONE-acetaminophen 5-325 MG per tablet  Commonly known as:  PERCOCET/ROXICET  Take 1-2 tablets by mouth every 4 (four) hours as needed for moderate pain.           Follow-up Information    Follow up with Mickeal Skinner, MD In 1 week.   Specialty:  General Surgery   Contact information:   [address] [place] [address] [phone]       Signed: Arta Bruce Kinsinger 09/17/2014, 7:31 AM

## 2014-09-17 NOTE — Progress Notes (Signed)
Pt's vitals wnl, incision w/o signs and symptoms of infection and is tolerating diet and pain medicine. Discussed discharged instructions with patient. Had no questions nor concerns. Will be discharged to home.

## 2014-09-27 ENCOUNTER — Encounter (HOSPITAL_COMMUNITY): Payer: Self-pay

## 2014-09-27 ENCOUNTER — Emergency Department (HOSPITAL_COMMUNITY): Payer: Medicare Other

## 2014-09-27 ENCOUNTER — Inpatient Hospital Stay (HOSPITAL_COMMUNITY)
Admission: EM | Admit: 2014-09-27 | Discharge: 2014-09-29 | DRG: 603 | Disposition: A | Discharge status: Home / Self Care | Payer: Medicare Other | Attending: Internal Medicine | Admitting: Internal Medicine

## 2014-09-27 DIAGNOSIS — Z809 Family history of malignant neoplasm, unspecified: Secondary | ICD-10-CM

## 2014-09-27 DIAGNOSIS — N179 Acute kidney failure, unspecified: Secondary | ICD-10-CM | POA: Diagnosis present

## 2014-09-27 DIAGNOSIS — R51 Headache: Secondary | ICD-10-CM | POA: Diagnosis not present

## 2014-09-27 DIAGNOSIS — Z9049 Acquired absence of other specified parts of digestive tract: Secondary | ICD-10-CM | POA: Diagnosis not present

## 2014-09-27 DIAGNOSIS — T814XXA Infection following a procedure, initial encounter: Secondary | ICD-10-CM | POA: Diagnosis present

## 2014-09-27 DIAGNOSIS — K219 Gastro-esophageal reflux disease without esophagitis: Secondary | ICD-10-CM | POA: Diagnosis present

## 2014-09-27 DIAGNOSIS — F419 Anxiety disorder, unspecified: Secondary | ICD-10-CM | POA: Diagnosis present

## 2014-09-27 DIAGNOSIS — E860 Dehydration: Secondary | ICD-10-CM | POA: Diagnosis present

## 2014-09-27 DIAGNOSIS — J45909 Unspecified asthma, uncomplicated: Secondary | ICD-10-CM | POA: Diagnosis present

## 2014-09-27 DIAGNOSIS — R011 Cardiac murmur, unspecified: Secondary | ICD-10-CM | POA: Diagnosis present

## 2014-09-27 DIAGNOSIS — Z79891 Long term (current) use of opiate analgesic: Secondary | ICD-10-CM | POA: Diagnosis not present

## 2014-09-27 DIAGNOSIS — E1165 Type 2 diabetes mellitus with hyperglycemia: Secondary | ICD-10-CM | POA: Diagnosis present

## 2014-09-27 DIAGNOSIS — F17210 Nicotine dependence, cigarettes, uncomplicated: Secondary | ICD-10-CM | POA: Diagnosis present

## 2014-09-27 DIAGNOSIS — Z6839 Body mass index (BMI) 39.0-39.9, adult: Secondary | ICD-10-CM | POA: Diagnosis not present

## 2014-09-27 DIAGNOSIS — L03311 Cellulitis of abdominal wall: Secondary | ICD-10-CM | POA: Diagnosis present

## 2014-09-27 DIAGNOSIS — Z7982 Long term (current) use of aspirin: Secondary | ICD-10-CM | POA: Diagnosis not present

## 2014-09-27 DIAGNOSIS — R109 Unspecified abdominal pain: Secondary | ICD-10-CM | POA: Diagnosis not present

## 2014-09-27 DIAGNOSIS — L7682 Other postprocedural complications of skin and subcutaneous tissue: Secondary | ICD-10-CM | POA: Diagnosis not present

## 2014-09-27 DIAGNOSIS — R1033 Periumbilical pain: Secondary | ICD-10-CM | POA: Diagnosis not present

## 2014-09-27 DIAGNOSIS — Y838 Other surgical procedures as the cause of abnormal reaction of the patient, or of later complication, without mention of misadventure at the time of the procedure: Secondary | ICD-10-CM | POA: Diagnosis present

## 2014-09-27 DIAGNOSIS — Z8249 Family history of ischemic heart disease and other diseases of the circulatory system: Secondary | ICD-10-CM

## 2014-09-27 DIAGNOSIS — Z79899 Other long term (current) drug therapy: Secondary | ICD-10-CM

## 2014-09-27 DIAGNOSIS — E119 Type 2 diabetes mellitus without complications: Secondary | ICD-10-CM

## 2014-09-27 DIAGNOSIS — R52 Pain, unspecified: Secondary | ICD-10-CM | POA: Diagnosis not present

## 2014-09-27 DIAGNOSIS — I70202 Unspecified atherosclerosis of native arteries of extremities, left leg: Secondary | ICD-10-CM | POA: Diagnosis not present

## 2014-09-27 DIAGNOSIS — F329 Major depressive disorder, single episode, unspecified: Secondary | ICD-10-CM | POA: Diagnosis present

## 2014-09-27 DIAGNOSIS — R200 Anesthesia of skin: Secondary | ICD-10-CM | POA: Diagnosis not present

## 2014-09-27 LAB — COMPREHENSIVE METABOLIC PANEL
ALT: 55 U/L (ref 17–63)
AST: 34 U/L (ref 15–41)
Albumin: 4 g/dL (ref 3.5–5.0)
Alkaline Phosphatase: 85 U/L (ref 38–126)
Anion gap: 8 (ref 5–15)
BUN: 14 mg/dL (ref 6–20)
CO2: 26 mmol/L (ref 22–32)
Calcium: 9.5 mg/dL (ref 8.9–10.3)
Chloride: 104 mmol/L (ref 101–111)
Creatinine, Ser: 1.27 mg/dL — ABNORMAL HIGH (ref 0.61–1.24)
GFR calc Af Amer: >60 mL/min (ref >60)
GFR calc non Af Amer: >60 mL/min (ref >60)
Glucose, Bld: 342 mg/dL — ABNORMAL HIGH (ref 65–99)
Potassium: 4.4 mmol/L (ref 3.5–5.1)
Sodium: 138 mmol/L (ref 135–145)
Total Bilirubin: 0.4 mg/dL (ref 0.3–1.2)
Total Protein: 7.6 g/dL (ref 6.5–8.1)

## 2014-09-27 LAB — CBC
HCT: 43.9 % (ref 39.0–52.0)
Hemoglobin: 15.4 g/dL (ref 13.0–17.0)
MCH: 27.2 pg (ref 26.0–34.0)
MCHC: 35.1 g/dL (ref 30.0–36.0)
MCV: 77.6 fL — ABNORMAL LOW (ref 78.0–100.0)
Platelets: 195 10*3/uL (ref 150–400)
RBC: 5.66 MIL/uL (ref 4.22–5.81)
RDW: 13.7 % (ref 11.5–15.5)
WBC: 7.2 10*3/uL (ref 4.0–10.5)

## 2014-09-27 LAB — TROPONIN I: Troponin I: <0.03 ng/mL (ref <0.031)

## 2014-09-27 LAB — CBG MONITORING, ED: Glucose-Capillary: 324 mg/dL — ABNORMAL HIGH (ref 65–99)

## 2014-09-27 MED ORDER — SODIUM CHLORIDE 0.9 % IV BOLUS (SEPSIS)
1000.0000 mL | Freq: Once | INTRAVENOUS | Status: AC
  Administered 2014-09-27: 1000 mL via INTRAVENOUS

## 2014-09-27 MED ORDER — CLINDAMYCIN PHOSPHATE 600 MG/50ML IV SOLN
600.0000 mg | Freq: Once | INTRAVENOUS | Status: AC
  Administered 2014-09-27: 600 mg via INTRAVENOUS
  Filled 2014-09-27: qty 50

## 2014-09-27 MED ORDER — IOHEXOL 300 MG/ML  SOLN
25.0000 mL | Freq: Once | INTRAMUSCULAR | Status: AC | PRN
  Administered 2014-09-27: 25 mL via ORAL

## 2014-09-27 MED ORDER — IOHEXOL 350 MG/ML SOLN
100.0000 mL | Freq: Once | INTRAVENOUS | Status: AC | PRN
  Administered 2014-09-27: 100 mL via INTRAVENOUS

## 2014-09-27 NOTE — H&P (Signed)
[facility] History and Physical  Lee Reid BJY:[id number] DOB: 09/20/1955 DOA: 09/27/2014  Referring physician: Sharlett Iles, MD PCP: Lee Duck, MD   Chief Complaint: Abdominal pain.  HPI: Lee Reid is a 59 y.o. male with a past medical history of type 2 diabetes, asthma, GERD, depression, anxiety who recently underwent laparoscopic cholecystectomy on August 29 who comes to the emergency department due to periumbilical tenderness for several days. He states that this started several days after his surgery and has been associated with alternating episodes of diarrhea and constipation. He denies fever, chills or decreased appetite.  He also complains of an episode of chest pain associated with shortness of breath 3 days ago which resolved on its own. On same day he had also a self-limited episode of right-sided numbness that lasted all day, but has now recurred. Patient stated that he has not taken his medication in the past 3 days. He complains of polyuria and polydipsia and has been drinking close to a gallon of water every day for the past 2 days. He is currently in no acute distress.   Review of Systems:  Constitutional:  No weight loss, night sweats, Fevers, chills, fatigue.  HEENT:  No headaches, Difficulty swallowing,Tooth/dental problems,Sore throat,  No sneezing, itching, ear ache, nasal congestion, post nasal drip,  Cardio-vascular:  Occasional swelling in lower extremities No chest pain, Orthopnea, PND,  anasarca, dizziness, palpitations  GI:  Positive periumbilical abdominal pain, No heartburn, indigestion,  nausea, vomiting, diarrhea, change in bowel habits, loss of appetite  Resp:  Episode of shortness of breath 2 days ago. No excess mucus, no productive cough, No non-productive cough, No coughing up of blood.No change in color of mucus.No wheezing. No chest wall deformity  Skin:  no rash or lesions.  GU:  no dysuria, change in color of  urine, no urgency or frequency. No flank pain.  Musculoskeletal:  No joint pain or swelling. No decreased range of motion. No back pain.  Psych:  No insomnia, change in mood or affect. No depression or anxiety. No memory loss.   Past Medical History  Diagnosis Date  . Diabetes mellitus without complication   . Anginal pain   . Heart murmur     born with heart murmur  . Dysrhythmia     irregular heartbeat due to stress  . Asthma   . Pneumonia   . Depression   . Anxiety   . GERD (gastroesophageal reflux disease)    Past Surgical History  Procedure Laterality Date  . Tonsillectomy    . Cholecystectomy N/A 09/16/2014    Procedure: LAPAROSCOPIC CHOLECYSTECTOMY ;  Surgeon: Arta Bruce Kinsinger, MD;  Location: [facility];  Service: General;  Laterality: N/A;   Social History:  reports that he has been smoking Cigarettes.  He has been smoking about 0.50 packs per day. He has never used smokeless tobacco. He reports that he does not drink alcohol or use illicit drugs.  No Known Allergies  Family History  Problem Relation Age of Onset  . Hypertension Mother   . Heart disease Father   . Cancer Father     Prior to Admission medications   Medication Sig Start Date End Date Taking? Authorizing Provider  aspirin 81 MG tablet Take 162 mg by mouth daily as needed for pain.   Yes Historical Provider, MD  canagliflozin (INVOKANA) 100 MG TABS tablet Take 1 tablet (100 mg total) by mouth daily. 08/29/14  Yes Lee Garter, MD  glipiZIDE (  GLUCOTROL XL) 5 MG 24 hr tablet Take 1 tablet (5 mg total) by mouth daily with breakfast. 08/02/14  Yes Lee Garter, MD  metFORMIN (GLUCOPHAGE) 1000 MG tablet Take 1 tablet (1,000 mg total) by mouth 2 (two) times daily with a meal. 08/02/14  Yes Lee Garter, MD  oxyCODONE-acetaminophen (PERCOCET/ROXICET) 5-325 MG per tablet Take 1-2 tablets by mouth every 4 (four) hours as needed for moderate pain. 09/17/14  Yes Arta Bruce Kinsinger, MD    cyclobenzaprine (FLEXERIL) 10 MG tablet Take 1 tablet (10 mg total) by mouth 3 (three) times daily as needed for muscle spasms. Patient not taking: Reported on 07/29/2014 09/23/13   Hazel Sams, PA-C  enalapril (VASOTEC) 2.5 MG tablet Take 1 tablet (2.5 mg total) by mouth daily. Patient not taking: Reported on 08/27/2014 08/02/14   Lee Garter, MD  ibuprofen (ADVIL,MOTRIN) 600 MG tablet Take 1 tablet (600 mg total) by mouth every 6 (six) hours as needed for mild pain. Patient not taking: Reported on 09/27/2014 09/17/14   Arta Bruce Kinsinger, MD  ibuprofen (ADVIL,MOTRIN) 800 MG tablet Take 1 tablet (800 mg total) by mouth 3 (three) times daily. Patient not taking: Reported on 07/29/2014 09/23/13   Hazel Sams, PA-C   Physical Exam: Filed Vitals:   09/27/14 1711 09/27/14 1713 09/27/14 2001 09/27/14 2207  BP: 125/79  123/72 126/87  Pulse: 82  82 88  Temp: 98.2 F (36.8 C)   98.2 F (36.8 C)  TempSrc: Oral   Oral  Resp: 19  20 19   Height:  6\' 1"  (1.854 m)    Weight:  135.626 kg (299 lb)    SpO2: 94%  95% 97%    Wt Readings from Last 3 Encounters:  09/27/14 135.626 kg (299 lb)  09/16/14 138.801 kg (306 lb)  08/29/14 138.801 kg (306 lb)    General:  Appears calm and comfortable Eyes: PERRL, normal lids, irises & conjunctiva ENT: grossly normal hearing, lips & tongue Neck: no LAD, masses or thyromegaly Cardiovascular: RRR, Y8-M5, positive systolic murmur 2/6. 1+ LE edema. Telemetry: SR, no arrhythmias  Respiratory: CTA bilaterally, no w/r/r. Normal respiratory effort. Abdomen: Obese, bowel sounds positive, umbilical surgical wound, soft, positive periumbilical tenderness, no guarding no rebound tenderness. Skin: no rash or induration seen on limited exam Musculoskeletal: grossly normal tone BUE/BLE Psychiatric: grossly normal mood and affect, speech fluent and appropriate Neurologic: grossly non-focal.          Labs on Admission:  Basic Metabolic Panel:  Recent Labs Lab  09/27/14 1537  NA 138  K 4.4  CL 104  CO2 26  GLUCOSE 342*  BUN 14  CREATININE 1.27*  CALCIUM 9.5   Liver Function Tests:  Recent Labs Lab 09/27/14 1537  AST 34  ALT 55  ALKPHOS 85  BILITOT 0.4  PROT 7.6  ALBUMIN 4.0   No results for input(s): LIPASE, AMYLASE in the last 168 hours. No results for input(s): AMMONIA in the last 168 hours. CBC:  Recent Labs Lab 09/27/14 1537  WBC 7.2  HGB 15.4  HCT 43.9  MCV 77.6*  PLT 195   Cardiac Enzymes:  Recent Labs Lab 09/27/14 1659  TROPONINI <0.03    CBG:  Recent Labs Lab 09/27/14 1432  GLUCAP 324*    Radiological Exams on Admission: Ct Head Wo Contrast  09/27/2014   CLINICAL DATA:  Headache with RIGHT-sided numbness. Auditory changes for 1 day, now resolved.  EXAM: CT HEAD WITHOUT CONTRAST  TECHNIQUE: Contiguous axial images were obtained  from the base of the skull through the vertex without intravenous contrast.  COMPARISON:  CT head 08/28/2013.  FINDINGS: No evidence for acute infarction, hemorrhage, mass lesion, hydrocephalus, or extra-axial fluid. Normal cerebral volume. No white matter disease. Calvarium intact. No sinus or mastoid disease. Dense opacification of the LEFT globe, likely chronic vitreous hemorrhage with tiny posterior globe calcification, stable from priors.  IMPRESSION: No acute intracranial findings. 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   Electronically Signed   By: Staci Righter M.D.   On: 09/27/2014 19:16   Ct Angio Chest Pe W/cm &/or Wo Cm  09/27/2014   CLINICAL DATA:  Periumbilical pain and not following cholecystectomy.  EXAM: CT ANGIOGRAPHY CHEST  CT ABDOMEN AND PELVIS WITH CONTRAST  TECHNIQUE: Multidetector CT imaging of the chest was performed using the standard protocol during bolus administration of intravenous contrast. Multiplanar CT image reconstructions and MIPs were obtained to evaluate the vascular anatomy. Multidetector CT imaging of the abdomen and  pelvis was performed using the standard protocol during bolus administration of intravenous contrast.  CONTRAST:  51mL OMNIPAQUE IOHEXOL 300 MG/ML  SOLN  COMPARISON:  None.  FINDINGS: CTA CHEST FINDINGS  Mediastinum: Normal heart size. No pericardial effusion. There is no mediastinal or hilar adenopathy identified. The main pulmonary artery is patent. No lobar or segmental pulmonary artery filling defects to suggest a clinically significant acute pulmonary embolus.  Lungs/Pleura: No pleural effusion. No atelectasis or airspace consolidation.  Musculoskeletal: No worrisome bone abnormality identified.  CT ABDOMEN and PELVIS FINDINGS  Lower chest:  Hepatobiliary: There is no focal liver abnormality. Postoperative changes from cholecystectomy noted. No biliary dilatation.  Pancreas: Negative  Spleen: Small nonspecific low-attenuation structure within the spleen is noted measuring 11 mm, image 27/series 5.  Adrenals/Urinary Tract: Right adrenal gland myelolipoma noted. The left adrenal gland is normal. Unremarkable appearance of the kidneys. The urinary bladder appears within normal limits.  Stomach/Bowel: The stomach is normal. The small bowel loops have a normal caliber. No obstruction or evidence for ileus. The appendix is visualized and appears normal. Unremarkable appearance of the colon.  Vascular/Lymphatic: Calcified atherosclerotic disease involves the abdominal aorta. No aneurysm. No enlarged retroperitoneal or mesenteric adenopathy. No enlarged pelvic or inguinal lymph nodes.  Reproductive: Prostate gland and seminal vesicles are within normal limits.  Other: There is no ascites or focal fluid collections within the abdomen or pelvis. Postoperative changes around the umbilicus identified. There is associated skin thickening and fat stranding identified. No discrete fluid collection identified.  Musculoskeletal: Degenerative disc disease noted within the lumbar spine.  Review of the MIP images confirms the  above findings.  IMPRESSION: 1. No evidence for acute pulmonary embolus. 2. Fat stranding and skin thickening around the umbilicus is noted. Likely postoperative. Cellulitis cannot be excluded. No discrete abscess. 3. Postoperative changes status post coli cystectomy. No fluid collection identified within the gallbladder fossa.   Electronically Signed   By: Kerby Moors M.D.   On: 09/27/2014 19:31   Ct Abdomen Pelvis W Contrast  09/27/2014   CLINICAL DATA:  Periumbilical pain and not following cholecystectomy.  EXAM: CT ANGIOGRAPHY CHEST  CT ABDOMEN AND PELVIS WITH CONTRAST  TECHNIQUE: Multidetector CT imaging of the chest was performed using the standard protocol during bolus administration of intravenous contrast. Multiplanar CT image reconstructions and MIPs were obtained to evaluate the vascular anatomy. Multidetector CT imaging of the abdomen and pelvis was performed using the standard protocol during bolus administration of intravenous contrast.  CONTRAST:  34mL OMNIPAQUE IOHEXOL 300 MG/ML  SOLN  COMPARISON:  None.  FINDINGS: CTA CHEST FINDINGS  Mediastinum: Normal heart size. No pericardial effusion. There is no mediastinal or hilar adenopathy identified. The main pulmonary artery is patent. No lobar or segmental pulmonary artery filling defects to suggest a clinically significant acute pulmonary embolus.  Lungs/Pleura: No pleural effusion. No atelectasis or airspace consolidation.  Musculoskeletal: No worrisome bone abnormality identified.  CT ABDOMEN and PELVIS FINDINGS  Lower chest:  Hepatobiliary: There is no focal liver abnormality. Postoperative changes from cholecystectomy noted. No biliary dilatation.  Pancreas: Negative  Spleen: Small nonspecific low-attenuation structure within the spleen is noted measuring 11 mm, image 27/series 5.  Adrenals/Urinary Tract: Right adrenal gland myelolipoma noted. The left adrenal gland is normal. Unremarkable appearance of the kidneys. The urinary bladder appears  within normal limits.  Stomach/Bowel: The stomach is normal. The small bowel loops have a normal caliber. No obstruction or evidence for ileus. The appendix is visualized and appears normal. Unremarkable appearance of the colon.  Vascular/Lymphatic: Calcified atherosclerotic disease involves the abdominal aorta. No aneurysm. No enlarged retroperitoneal or mesenteric adenopathy. No enlarged pelvic or inguinal lymph nodes.  Reproductive: Prostate gland and seminal vesicles are within normal limits.  Other: There is no ascites or focal fluid collections within the abdomen or pelvis. Postoperative changes around the umbilicus identified. There is associated skin thickening and fat stranding identified. No discrete fluid collection identified.  Musculoskeletal: Degenerative disc disease noted within the lumbar spine.  Review of the MIP images confirms the above findings.  IMPRESSION: 1. No evidence for acute pulmonary embolus. 2. Fat stranding and skin thickening around the umbilicus is noted. Likely postoperative. Cellulitis cannot be excluded. No discrete abscess. 3. Postoperative changes status post coli cystectomy. No fluid collection identified within the gallbladder fossa.   Electronically Signed   By: Kerby Moors M.D.   On: 09/27/2014 19:31    EKG: Independently reviewed. Vent. rate 83 BPM PR interval 148 ms QRS duration 130 ms QT/QTc 371/436 ms P-R-T axes 35 257 5 Sinus rhythm RBBB and LAFB  Assessment/Plan Principal Problem:   Cellulitis of abdominal wall   S/P cholecystectomy Continue IV clindamycin. Analgesics as needed. Consider general surgery evaluation if it worsens.  Active Problems:   Uncontrolled Type 2 diabetes mellitus without complications Likely due to not taking his oral hypoglycemics for the past 2 days. Resume current home therapy. CBG monitoring and regular insulin sliding scale. Patient already received IV fluid boluses in the emergency department and has 1+ pitting  edema of the lower extremity, so I will defer further use of IV fluids at this time..    Morbid obesity Lifestyle modifications.       Code Status: Full code. DVT Prophylaxis: Lovenox SQ. Family Communication:  Disposition Plan: Admit for IV antibiotic therapy.  Time spent: Over 60 minutes.   Reubin Milan [facility] Pager [phone].

## 2014-09-27 NOTE — ED Notes (Signed)
WENT TO COLLECT BLOOD,RN WILL COLLECT AS THEY START AN IV

## 2014-09-27 NOTE — ED Notes (Signed)
Called pt for blood dray, no answer

## 2014-09-27 NOTE — ED Provider Notes (Signed)
CSN: [id number]     Arrival date & time 09/27/14  1342 History   First MD Initiated Contact with Patient 09/27/14 1646     Chief Complaint  Patient presents with  . Post-op Problem  . Toe Pain     (Consider location/radiation/quality/duration/timing/severity/associated sxs/prior Treatment) HPI Comments: 59 year old male with past medical history including type 2 diabetes mellitus, GERD, recent cholecystectomy who presents with abdominal pain. The patient had a laparoscopic cholecystectomy on 8/29 and was discharged the following day. He states that he had been recovering appropriately at home until a few days ago, when he began having pain and tenderness around his umbilicus. This pain has worsened and is now constant. No associated fevers or vomiting. He has had alternating episodes of diarrhea and constipation since surgery. 2 days ago, he reports an episode of chest pain and left arm numbness that lasted approximately 1 hour and was associated with shortness of breath. The episode spontaneously resolved and has not reoccurred. On the same day, he had an episode of right-sided numbness that involved his arm and leg as well as pain in the back of his head which lasted approximately 1 day and then resolved. He currently denies any of these symptoms. He has not taken any of his medications for the past 2-3 days because he has been laying in bed not feeling well. No chest pain or shortness of breath currently. No urinary symptoms. Patient also notes that today he noticed a black spot on his left toe. He does not recall hitting his toe but it is sore.  Patient is a 59 y.o. male presenting with toe pain. The history is provided by the patient.  Toe Pain    Past Medical History  Diagnosis Date  . Diabetes mellitus without complication   . Anginal pain   . Heart murmur     born with heart murmur  . Dysrhythmia     irregular heartbeat due to stress  . Asthma   . Pneumonia   . Depression   .  Anxiety   . GERD (gastroesophageal reflux disease)    Past Surgical History  Procedure Laterality Date  . Tonsillectomy    . Cholecystectomy N/A 09/16/2014    Procedure: LAPAROSCOPIC CHOLECYSTECTOMY ;  Surgeon: Arta Bruce Kinsinger, MD;  Location: [facility];  Service: General;  Laterality: N/A;   Family History  Problem Relation Age of Onset  . Hypertension Mother   . Heart disease Father   . Cancer Father    Social History  Substance Use Topics  . Smoking status: Current Every Day Smoker -- 0.50 packs/day    Types: Cigarettes  . Smokeless tobacco: Never Used  . Alcohol Use: No    Review of Systems  10 Systems reviewed and are negative for acute change except as noted in the HPI.   Allergies  Review of patient's allergies indicates no known allergies.  Home Medications   Prior to Admission medications   Medication Sig Start Date End Date Taking? Authorizing Provider  aspirin 81 MG tablet Take 162 mg by mouth daily as needed for pain.   Yes Historical Provider, MD  canagliflozin (INVOKANA) 100 MG TABS tablet Take 1 tablet (100 mg total) by mouth daily. 08/29/14  Yes Tresa Garter, MD  glipiZIDE (GLUCOTROL XL) 5 MG 24 hr tablet Take 1 tablet (5 mg total) by mouth daily with breakfast. 08/02/14  Yes Tresa Garter, MD  metFORMIN (GLUCOPHAGE) 1000 MG tablet Take 1 tablet (1,000 mg total) by  mouth 2 (two) times daily with a meal. 08/02/14  Yes Tresa Garter, MD  oxyCODONE-acetaminophen (PERCOCET/ROXICET) 5-325 MG per tablet Take 1-2 tablets by mouth every 4 (four) hours as needed for moderate pain. 09/17/14  Yes Arta Bruce Kinsinger, MD  cyclobenzaprine (FLEXERIL) 10 MG tablet Take 1 tablet (10 mg total) by mouth 3 (three) times daily as needed for muscle spasms. Patient not taking: Reported on 07/29/2014 09/23/13   Hazel Sams, PA-C  enalapril (VASOTEC) 2.5 MG tablet Take 1 tablet (2.5 mg total) by mouth daily. Patient not taking: Reported on 08/27/2014 08/02/14    Tresa Garter, MD  ibuprofen (ADVIL,MOTRIN) 600 MG tablet Take 1 tablet (600 mg total) by mouth every 6 (six) hours as needed for mild pain. Patient not taking: Reported on 09/27/2014 09/17/14   Arta Bruce Kinsinger, MD  ibuprofen (ADVIL,MOTRIN) 800 MG tablet Take 1 tablet (800 mg total) by mouth 3 (three) times daily. Patient not taking: Reported on 07/29/2014 09/23/13   Hazel Sams, PA-C   BP 123/72 mmHg  Pulse 82  Temp(Src) 98.2 F (36.8 C) (Oral)  Resp 20  Ht 6\' 1"  (1.854 m)  Wt 299 lb (135.626 kg)  BMI 39.46 kg/m2  SpO2 95% Physical Exam  Constitutional: He is oriented to person, place, and time. He appears well-developed and well-nourished. No distress.  HENT:  Head: Normocephalic and atraumatic.  Moist mucous membranes  Eyes: EOM are normal.  L pupil irregularly shaped and non-reactive to light, R pupil reactive; horizontal nystagmus noted  Neck: Neck supple.  Cardiovascular: Normal rate, regular rhythm and normal heart sounds.   No murmur heard. Pulmonary/Chest: Effort normal and breath sounds normal. No respiratory distress.  Abdominal: Soft. Bowel sounds are normal. He exhibits no distension.  Tenderness to palpation just superior to umbilicus w/ focal induration, no fluctuance/erythema/drainage  Musculoskeletal:  1+ pitting edema b/l LE; hemorrhagic bulla on tip of L great toe  Neurological: He is alert and oriented to person, place, and time.  Fluent speech  Skin: Skin is warm and dry.  No drainage from abdominal surgical incision sites  Psychiatric: He has a normal mood and affect. Judgment normal.  Nursing note and vitals reviewed.   ED Course  Procedures (including critical care time) Labs Review Labs Reviewed  COMPREHENSIVE METABOLIC PANEL - Abnormal; Notable for the following:    Glucose, Bld 342 (*)    Creatinine, Ser 1.27 (*)    All other components within normal limits  CBC - Abnormal; Notable for the following:    MCV 77.6 (*)    All other  components within normal limits  CBG MONITORING, ED - Abnormal; Notable for the following:    Glucose-Capillary 324 (*)    All other components within normal limits  TROPONIN I  CBG MONITORING, ED    Imaging Review Ct Head Wo Contrast  09/27/2014   CLINICAL DATA:  Headache with RIGHT-sided numbness. Auditory changes for 1 day, now resolved.  EXAM: CT HEAD WITHOUT CONTRAST  TECHNIQUE: Contiguous axial images were obtained from the base of the skull through the vertex without intravenous contrast.  COMPARISON:  CT head 08/28/2013.  FINDINGS: No evidence for acute infarction, hemorrhage, mass lesion, hydrocephalus, or extra-axial fluid. Normal cerebral volume. No white matter disease. Calvarium intact. No sinus or mastoid disease. Dense opacification of the LEFT globe, likely chronic vitreous hemorrhage with tiny posterior globe calcification, stable from priors.  IMPRESSION: No acute intracranial findings. 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  Electronically Signed   By: Staci Righter M.D.   On: 09/27/2014 19:16   Ct Angio Chest Pe W/cm &/or Wo Cm  09/27/2014   CLINICAL DATA:  Periumbilical pain and not following cholecystectomy.  EXAM: CT ANGIOGRAPHY CHEST  CT ABDOMEN AND PELVIS WITH CONTRAST  TECHNIQUE: Multidetector CT imaging of the chest was performed using the standard protocol during bolus administration of intravenous contrast. Multiplanar CT image reconstructions and MIPs were obtained to evaluate the vascular anatomy. Multidetector CT imaging of the abdomen and pelvis was performed using the standard protocol during bolus administration of intravenous contrast.  CONTRAST:  26mL OMNIPAQUE IOHEXOL 300 MG/ML  SOLN  COMPARISON:  None.  FINDINGS: CTA CHEST FINDINGS  Mediastinum: Normal heart size. No pericardial effusion. There is no mediastinal or hilar adenopathy identified. The main pulmonary artery is patent. No lobar or segmental pulmonary artery filling defects to  suggest a clinically significant acute pulmonary embolus.  Lungs/Pleura: No pleural effusion. No atelectasis or airspace consolidation.  Musculoskeletal: No worrisome bone abnormality identified.  CT ABDOMEN and PELVIS FINDINGS  Lower chest:  Hepatobiliary: There is no focal liver abnormality. Postoperative changes from cholecystectomy noted. No biliary dilatation.  Pancreas: Negative  Spleen: Small nonspecific low-attenuation structure within the spleen is noted measuring 11 mm, image 27/series 5.  Adrenals/Urinary Tract: Right adrenal gland myelolipoma noted. The left adrenal gland is normal. Unremarkable appearance of the kidneys. The urinary bladder appears within normal limits.  Stomach/Bowel: The stomach is normal. The small bowel loops have a normal caliber. No obstruction or evidence for ileus. The appendix is visualized and appears normal. Unremarkable appearance of the colon.  Vascular/Lymphatic: Calcified atherosclerotic disease involves the abdominal aorta. No aneurysm. No enlarged retroperitoneal or mesenteric adenopathy. No enlarged pelvic or inguinal lymph nodes.  Reproductive: Prostate gland and seminal vesicles are within normal limits.  Other: There is no ascites or focal fluid collections within the abdomen or pelvis. Postoperative changes around the umbilicus identified. There is associated skin thickening and fat stranding identified. No discrete fluid collection identified.  Musculoskeletal: Degenerative disc disease noted within the lumbar spine.  Review of the MIP images confirms the above findings.  IMPRESSION: 1. No evidence for acute pulmonary embolus. 2. Fat stranding and skin thickening around the umbilicus is noted. Likely postoperative. Cellulitis cannot be excluded. No discrete abscess. 3. Postoperative changes status post coli cystectomy. No fluid collection identified within the gallbladder fossa.   Electronically Signed   By: Kerby Moors M.D.   On: 09/27/2014 19:31   Ct  Abdomen Pelvis W Contrast  09/27/2014   CLINICAL DATA:  Periumbilical pain and not following cholecystectomy.  EXAM: CT ANGIOGRAPHY CHEST  CT ABDOMEN AND PELVIS WITH CONTRAST  TECHNIQUE: Multidetector CT imaging of the chest was performed using the standard protocol during bolus administration of intravenous contrast. Multiplanar CT image reconstructions and MIPs were obtained to evaluate the vascular anatomy. Multidetector CT imaging of the abdomen and pelvis was performed using the standard protocol during bolus administration of intravenous contrast.  CONTRAST:  57mL OMNIPAQUE IOHEXOL 300 MG/ML  SOLN  COMPARISON:  None.  FINDINGS: CTA CHEST FINDINGS  Mediastinum: Normal heart size. No pericardial effusion. There is no mediastinal or hilar adenopathy identified. The main pulmonary artery is patent. No lobar or segmental pulmonary artery filling defects to suggest a clinically significant acute pulmonary embolus.  Lungs/Pleura: No pleural effusion. No atelectasis or airspace consolidation.  Musculoskeletal: No worrisome bone abnormality identified.  CT ABDOMEN and PELVIS FINDINGS  Lower chest:  Hepatobiliary: There is no focal liver abnormality. Postoperative changes from cholecystectomy noted. No biliary dilatation.  Pancreas: Negative  Spleen: Small nonspecific low-attenuation structure within the spleen is noted measuring 11 mm, image 27/series 5.  Adrenals/Urinary Tract: Right adrenal gland myelolipoma noted. The left adrenal gland is normal. Unremarkable appearance of the kidneys. The urinary bladder appears within normal limits.  Stomach/Bowel: The stomach is normal. The small bowel loops have a normal caliber. No obstruction or evidence for ileus. The appendix is visualized and appears normal. Unremarkable appearance of the colon.  Vascular/Lymphatic: Calcified atherosclerotic disease involves the abdominal aorta. No aneurysm. No enlarged retroperitoneal or mesenteric adenopathy. No enlarged pelvic or  inguinal lymph nodes.  Reproductive: Prostate gland and seminal vesicles are within normal limits.  Other: There is no ascites or focal fluid collections within the abdomen or pelvis. Postoperative changes around the umbilicus identified. There is associated skin thickening and fat stranding identified. No discrete fluid collection identified.  Musculoskeletal: Degenerative disc disease noted within the lumbar spine.  Review of the MIP images confirms the above findings.  IMPRESSION: 1. No evidence for acute pulmonary embolus. 2. Fat stranding and skin thickening around the umbilicus is noted. Likely postoperative. Cellulitis cannot be excluded. No discrete abscess. 3. Postoperative changes status post coli cystectomy. No fluid collection identified within the gallbladder fossa.   Electronically Signed   By: Kerby Moors M.D.   On: 09/27/2014 19:31   I have personally reviewed and evaluated these lab results as part of my medical decision-making.   EKG Interpretation   Date/Time:  Friday September 27 2014 17:11:28 EDT Ventricular Rate:  83 PR Interval:  148 QRS Duration: 130 QT Interval:  371 QTC Calculation: 436 R Axis:   -103 Text Interpretation:  Sinus rhythm RBBB and LAFB No significant change  since last tracing Confirmed by Vahe Pienta MD, [place] [phone]) on 09/27/2014  5:50:35 PM      MDM   Final diagnoses:  None   postsurgical abdominal wall cellulitis Type 2 diabetes mellitus AKI    59 year old male with recent cholecystectomy who presents with 2 days of peri- umbilical abdominal pain, an episode of chest pain and left arm numbness that occurred 2 days ago, and an episode of right-sided numbness that occurred several days ago. Patient well-appearing with reassuring vital signs at presentation. He had focal tenderness just above his umbilicus with mild induration. No drainage from his surgical sites. Also had a hemorrhagic bulla on the tip of his left great toe. No other areas of  hemorrhage or petechia a on hands or feet. Obtained above lab work as well as EKG and chest x-ray. Because of the patient's recent surgery, obtained a CT of the abdomen and pelvis to rule out postsurgical infection. Because of the patient's report of sudden onset of chest pain, shortness of breath that occurred relatively soon postoperatively, also obtained a CTA of the chest to rule out PE. Obtained CT of the head because of the patient's report of episode of numbness and headache.  Labs show creatinine of 1.27, WBC 7.2, glucose 342. EKG with no significant change from previous. CTA negative for PE. CT of head negative for acute process. Troponin also negative. CT shows fat stranding and thickening around the umbilicus. Given the patient's new onset of tenderness in the same area, I suspect that this represents early cellulitis. Gave the patient IV clindamycin. I spoke with the general surgeon on call regarding the patient's presentation. She recommended medical admission vs discharge w/ antibiotics.  Because of the patient's diabetes, mild AK I, and postsurgical cellulitis, admitted patient to general medicine for antibiotic treatment.  Sharlett Iles, MD 09/28/14 [phone]

## 2014-09-27 NOTE — ED Notes (Signed)
Pt here with knot in stomach after a gallbladder surgery on 8/29.  No fever.  Painful. Also noticeable black area to left big toe from ingrown toe nail extraction 1 1/2 weeks ago.

## 2014-09-28 ENCOUNTER — Inpatient Hospital Stay (HOSPITAL_COMMUNITY): Payer: Medicare Other

## 2014-09-28 DIAGNOSIS — Z9049 Acquired absence of other specified parts of digestive tract: Secondary | ICD-10-CM

## 2014-09-28 DIAGNOSIS — I70202 Unspecified atherosclerosis of native arteries of extremities, left leg: Secondary | ICD-10-CM

## 2014-09-28 DIAGNOSIS — N179 Acute kidney failure, unspecified: Secondary | ICD-10-CM

## 2014-09-28 DIAGNOSIS — E119 Type 2 diabetes mellitus without complications: Secondary | ICD-10-CM

## 2014-09-28 DIAGNOSIS — R52 Pain, unspecified: Secondary | ICD-10-CM

## 2014-09-28 LAB — CBC WITH DIFFERENTIAL/PLATELET
Basophils Absolute: 0 10*3/uL (ref 0.0–0.1)
Basophils Relative: 0 % (ref 0–1)
Eosinophils Absolute: 0.1 10*3/uL (ref 0.0–0.7)
Eosinophils Relative: 1 % (ref 0–5)
HCT: 42.5 % (ref 39.0–52.0)
Hemoglobin: 14.5 g/dL (ref 13.0–17.0)
Lymphocytes Relative: 42 % (ref 12–46)
Lymphs Abs: 2.7 10*3/uL (ref 0.7–4.0)
MCH: 26.5 pg (ref 26.0–34.0)
MCHC: 34.1 g/dL (ref 30.0–36.0)
MCV: 77.6 fL — ABNORMAL LOW (ref 78.0–100.0)
Monocytes Absolute: 0.4 10*3/uL (ref 0.1–1.0)
Monocytes Relative: 6 % (ref 3–12)
Neutro Abs: 3.3 10*3/uL (ref 1.7–7.7)
Neutrophils Relative %: 51 % (ref 43–77)
Platelets: 191 10*3/uL (ref 150–400)
RBC: 5.48 MIL/uL (ref 4.22–5.81)
RDW: 13.8 % (ref 11.5–15.5)
WBC: 6.5 10*3/uL (ref 4.0–10.5)

## 2014-09-28 LAB — COMPREHENSIVE METABOLIC PANEL
ALT: 47 U/L (ref 17–63)
AST: 28 U/L (ref 15–41)
Albumin: 3.7 g/dL (ref 3.5–5.0)
Alkaline Phosphatase: 75 U/L (ref 38–126)
Anion gap: 7 (ref 5–15)
BUN: 14 mg/dL (ref 6–20)
CO2: 26 mmol/L (ref 22–32)
Calcium: 9.2 mg/dL (ref 8.9–10.3)
Chloride: 104 mmol/L (ref 101–111)
Creatinine, Ser: 0.93 mg/dL (ref 0.61–1.24)
GFR calc Af Amer: >60 mL/min (ref >60)
GFR calc non Af Amer: >60 mL/min (ref >60)
Glucose, Bld: 182 mg/dL — ABNORMAL HIGH (ref 65–99)
Potassium: 4.3 mmol/L (ref 3.5–5.1)
Sodium: 137 mmol/L (ref 135–145)
Total Bilirubin: 0.3 mg/dL (ref 0.3–1.2)
Total Protein: 7 g/dL (ref 6.5–8.1)

## 2014-09-28 LAB — GLUCOSE, CAPILLARY
Glucose-Capillary: 158 mg/dL — ABNORMAL HIGH (ref 65–99)
Glucose-Capillary: 200 mg/dL — ABNORMAL HIGH (ref 65–99)
Glucose-Capillary: 134 mg/dL — ABNORMAL HIGH (ref 65–99)
Glucose-Capillary: 183 mg/dL — ABNORMAL HIGH (ref 65–99)

## 2014-09-28 MED ORDER — CLINDAMYCIN PHOSPHATE 900 MG/50ML IV SOLN
900.0000 mg | Freq: Three times a day (TID) | INTRAVENOUS | Status: DC
  Administered 2014-09-28 (×2): 900 mg via INTRAVENOUS
  Filled 2014-09-28 (×3): qty 50

## 2014-09-28 MED ORDER — ENOXAPARIN SODIUM 80 MG/0.8ML ~~LOC~~ SOLN
65.0000 mg | SUBCUTANEOUS | Status: DC
  Administered 2014-09-28: 65 mg via SUBCUTANEOUS
  Filled 2014-09-28: qty 0.8

## 2014-09-28 MED ORDER — FAMOTIDINE 20 MG PO TABS
20.0000 mg | ORAL_TABLET | Freq: Two times a day (BID) | ORAL | Status: DC
  Administered 2014-09-28 (×3): 20 mg via ORAL
  Filled 2014-09-28 (×5): qty 1

## 2014-09-28 MED ORDER — METFORMIN HCL 500 MG PO TABS
1000.0000 mg | ORAL_TABLET | Freq: Two times a day (BID) | ORAL | Status: DC
  Filled 2014-09-28 (×3): qty 2

## 2014-09-28 MED ORDER — OXYCODONE-ACETAMINOPHEN 5-325 MG PO TABS
1.0000 | ORAL_TABLET | ORAL | Status: DC | PRN
  Administered 2014-09-28: 2 via ORAL
  Filled 2014-09-28: qty 2

## 2014-09-28 MED ORDER — ASPIRIN 81 MG PO CHEW
162.0000 mg | CHEWABLE_TABLET | Freq: Every day | ORAL | Status: DC | PRN
  Filled 2014-09-28: qty 2

## 2014-09-28 MED ORDER — ENOXAPARIN SODIUM 60 MG/0.6ML ~~LOC~~ SOLN
60.0000 mg | SUBCUTANEOUS | Status: DC
  Filled 2014-09-28: qty 0.6

## 2014-09-28 MED ORDER — ONDANSETRON HCL 4 MG/2ML IJ SOLN
4.0000 mg | Freq: Four times a day (QID) | INTRAMUSCULAR | Status: DC | PRN
  Administered 2014-09-28: 4 mg via INTRAVENOUS
  Filled 2014-09-28: qty 2

## 2014-09-28 MED ORDER — INSULIN ASPART 100 UNIT/ML ~~LOC~~ SOLN: 0.0000 [IU] | Freq: Three times a day (TID) | SUBCUTANEOUS | Status: DC

## 2014-09-28 MED ORDER — ONDANSETRON HCL 4 MG PO TABS: 4.0000 mg | ORAL_TABLET | Freq: Four times a day (QID) | ORAL | Status: DC | PRN

## 2014-09-28 MED ORDER — GLIPIZIDE ER 5 MG PO TB24
5.0000 mg | ORAL_TABLET | Freq: Every day | ORAL | Status: DC
  Administered 2014-09-28 – 2014-09-29 (×2): 5 mg via ORAL
  Filled 2014-09-28 (×3): qty 1

## 2014-09-28 MED ORDER — CANAGLIFLOZIN 100 MG PO TABS
100.0000 mg | ORAL_TABLET | Freq: Every day | ORAL | Status: DC
  Administered 2014-09-28: 100 mg via ORAL
  Filled 2014-09-28 (×2): qty 1

## 2014-09-28 MED ORDER — METFORMIN HCL 500 MG PO TABS
1000.0000 mg | ORAL_TABLET | Freq: Two times a day (BID) | ORAL | Status: DC
  Filled 2014-09-28: qty 2

## 2014-09-28 NOTE — Progress Notes (Addendum)
[facility] Progress Note   DAHMIR EPPERLY  YTK:[id number]  DOB: [birth year]  DOA: 09/27/2014 PCP: Tora Duck, MD  Brief narrative: Lee Reid is a 59 y.o. male with uncontrolled diabetes mellitus and morbid obesity who presented to the hospital as the tip of his left toe had turned black. He was also found to have some tenderness in the mid abdomen and a CT of the abdomen was done-report was suspicious for cellulitis.   Subjective: States that his belly is a little tender but his main concern is his toe that is black. He is not having any pain in this toe for the past couple of days. Does not recall any trauma to the toe.  Assessment/Plan: Principal Problem:   Cellulitis of abdominal wall? -No erythema or other physical signs of cellulitis- patient examined and CT reviewed by surgery who feels that stranding noted on CT is expected postoperative -Will DC clindamycin  Active Problems:   ARF (acute renal failure) - hold Enalapril and Ibuprofen -Resolved  Black left toe -The medial tip of the toe is black and sharply demarcated-I have asked for vascular surgery consult suspects is possibly a hematoma-bilateral ABIs are normal    Type 2 diabetes mellitus without complications - cont Invokana, Metformin and Glipizide -he is declining to use insulin in the hospital -Last A1c was 13 and he states this is why Invokana was added    Morbid obesity    S/P cholecystectomy    Code Status:     Code Status Orders        Start     Ordered   09/28/14 0011  Full code   Continuous     09/28/14 0010      DVT prophylaxis: Lovenox Consultants: Dr. Bridgett Larsson  Antibiotics: Anti-infectives    Start     Dose/Rate Route Frequency Ordered Stop   09/28/14 0600  clindamycin (CLEOCIN) IVPB 900 mg     900 mg 100 mL/hr over 30 Minutes Intravenous 3 times per day 09/28/14 0010     09/27/14 2130  clindamycin (CLEOCIN) IVPB 600 mg     600 mg 100 mL/hr over 30 Minutes Intravenous   Once 09/27/14 2128 09/27/14 2301      Objective: Filed Weights   09/27/14 1713  Weight: 135.626 kg (299 lb)    Intake/Output Summary (Last 24 hours) at 09/28/14 0749 Last data filed at 09/28/14 5573  Gross per 24 hour  Intake   1470 ml  Output      0 ml  Net   1470 ml     Vitals Filed Vitals:   09/27/14 2001 09/27/14 2207 09/27/14 2316 09/28/14 0614  BP: 123/72 126/87 133/87 116/68  Pulse: 82 88 78 72  Temp:  98.2 F (36.8 C) 98.2 F (36.8 C) 98.3 F (36.8 C)  TempSrc:  Oral Oral Oral  Resp: 20 19 20 17   Height:      Weight:      SpO2: 95% 97% 99% 96%    Exam:  General:  Pt is alert, not in acute distress  HEENT: No icterus, No thrush, oral mucosa moist  Cardiovascular: regular rate and rhythm, S1/S2 No murmur- good pedal pulses  Respiratory: clear to auscultation bilaterally   Abdomen: Soft, +Bowel sounds, small area of tenderness just above the umbilicus, non distended, no guarding  MSK: No LE edema, cyanosis or clubbing- left toe superior medial aspect is black, sharply demarcated-nontender  Data Reviewed: Basic Metabolic Panel:  Recent Labs Lab 09/27/14  1537  NA 138  K 4.4  CL 104  CO2 26  GLUCOSE 342*  BUN 14  CREATININE 1.27*  CALCIUM 9.5   Liver Function Tests:  Recent Labs Lab 09/27/14 1537  AST 34  ALT 55  ALKPHOS 85  BILITOT 0.4  PROT 7.6  ALBUMIN 4.0   No results for input(s): LIPASE, AMYLASE in the last 168 hours. No results for input(s): AMMONIA in the last 168 hours. CBC:  Recent Labs Lab 09/27/14 1537 09/28/14 0534  WBC 7.2 6.5  NEUTROABS  --  3.3  HGB 15.4 14.5  HCT 43.9 42.5  MCV 77.6* 77.6*  PLT 195 191   Cardiac Enzymes:  Recent Labs Lab 09/27/14 1659  TROPONINI <0.03   BNP (last 3 results) No results for input(s): BNP in the last 8760 hours.  ProBNP (last 3 results) No results for input(s): PROBNP in the last 8760 hours.  CBG:  Recent Labs Lab 09/27/14 1432 09/28/14 0746  GLUCAP 324* 158*     No results found for this or any previous visit (from the past 240 hour(s)).   Studies: Ct Head Wo Contrast  09/27/2014   CLINICAL DATA:  Headache with RIGHT-sided numbness. Auditory changes for 1 day, now resolved.  EXAM: CT HEAD WITHOUT CONTRAST  TECHNIQUE: Contiguous axial images were obtained from the base of the skull through the vertex without intravenous contrast.  COMPARISON:  CT head 08/28/2013.  FINDINGS: No evidence for acute infarction, hemorrhage, mass lesion, hydrocephalus, or extra-axial fluid. Normal cerebral volume. No white matter disease. Calvarium intact. No sinus or mastoid disease. Dense opacification of the LEFT globe, likely chronic vitreous hemorrhage with tiny posterior globe calcification, stable from priors.  IMPRESSION: No acute intracranial findings. 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   Electronically Signed   By: Staci Righter M.D.   On: 09/27/2014 19:16   Ct Angio Chest Pe W/cm &/or Wo Cm  09/27/2014   CLINICAL DATA:  Periumbilical pain and not following cholecystectomy.  EXAM: CT ANGIOGRAPHY CHEST  CT ABDOMEN AND PELVIS WITH CONTRAST  TECHNIQUE: Multidetector CT imaging of the chest was performed using the standard protocol during bolus administration of intravenous contrast. Multiplanar CT image reconstructions and MIPs were obtained to evaluate the vascular anatomy. Multidetector CT imaging of the abdomen and pelvis was performed using the standard protocol during bolus administration of intravenous contrast.  CONTRAST:  9mL OMNIPAQUE IOHEXOL 300 MG/ML  SOLN  COMPARISON:  None.  FINDINGS: CTA CHEST FINDINGS  Mediastinum: Normal heart size. No pericardial effusion. There is no mediastinal or hilar adenopathy identified. The main pulmonary artery is patent. No lobar or segmental pulmonary artery filling defects to suggest a clinically significant acute pulmonary embolus.  Lungs/Pleura: No pleural effusion. No atelectasis or airspace  consolidation.  Musculoskeletal: No worrisome bone abnormality identified.  CT ABDOMEN and PELVIS FINDINGS  Lower chest:  Hepatobiliary: There is no focal liver abnormality. Postoperative changes from cholecystectomy noted. No biliary dilatation.  Pancreas: Negative  Spleen: Small nonspecific low-attenuation structure within the spleen is noted measuring 11 mm, image 27/series 5.  Adrenals/Urinary Tract: Right adrenal gland myelolipoma noted. The left adrenal gland is normal. Unremarkable appearance of the kidneys. The urinary bladder appears within normal limits.  Stomach/Bowel: The stomach is normal. The small bowel loops have a normal caliber. No obstruction or evidence for ileus. The appendix is visualized and appears normal. Unremarkable appearance of the colon.  Vascular/Lymphatic: Calcified atherosclerotic disease involves the abdominal aorta. No aneurysm. No enlarged  retroperitoneal or mesenteric adenopathy. No enlarged pelvic or inguinal lymph nodes.  Reproductive: Prostate gland and seminal vesicles are within normal limits.  Other: There is no ascites or focal fluid collections within the abdomen or pelvis. Postoperative changes around the umbilicus identified. There is associated skin thickening and fat stranding identified. No discrete fluid collection identified.  Musculoskeletal: Degenerative disc disease noted within the lumbar spine.  Review of the MIP images confirms the above findings.  IMPRESSION: 1. No evidence for acute pulmonary embolus. 2. Fat stranding and skin thickening around the umbilicus is noted. Likely postoperative. Cellulitis cannot be excluded. No discrete abscess. 3. Postoperative changes status post coli cystectomy. No fluid collection identified within the gallbladder fossa.   Electronically Signed   By: Kerby Moors M.D.   On: 09/27/2014 19:31   Ct Abdomen Pelvis W Contrast  09/27/2014   CLINICAL DATA:  Periumbilical pain and not following cholecystectomy.  EXAM: CT  ANGIOGRAPHY CHEST  CT ABDOMEN AND PELVIS WITH CONTRAST  TECHNIQUE: Multidetector CT imaging of the chest was performed using the standard protocol during bolus administration of intravenous contrast. Multiplanar CT image reconstructions and MIPs were obtained to evaluate the vascular anatomy. Multidetector CT imaging of the abdomen and pelvis was performed using the standard protocol during bolus administration of intravenous contrast.  CONTRAST:  45mL OMNIPAQUE IOHEXOL 300 MG/ML  SOLN  COMPARISON:  None.  FINDINGS: CTA CHEST FINDINGS  Mediastinum: Normal heart size. No pericardial effusion. There is no mediastinal or hilar adenopathy identified. The main pulmonary artery is patent. No lobar or segmental pulmonary artery filling defects to suggest a clinically significant acute pulmonary embolus.  Lungs/Pleura: No pleural effusion. No atelectasis or airspace consolidation.  Musculoskeletal: No worrisome bone abnormality identified.  CT ABDOMEN and PELVIS FINDINGS  Lower chest:  Hepatobiliary: There is no focal liver abnormality. Postoperative changes from cholecystectomy noted. No biliary dilatation.  Pancreas: Negative  Spleen: Small nonspecific low-attenuation structure within the spleen is noted measuring 11 mm, image 27/series 5.  Adrenals/Urinary Tract: Right adrenal gland myelolipoma noted. The left adrenal gland is normal. Unremarkable appearance of the kidneys. The urinary bladder appears within normal limits.  Stomach/Bowel: The stomach is normal. The small bowel loops have a normal caliber. No obstruction or evidence for ileus. The appendix is visualized and appears normal. Unremarkable appearance of the colon.  Vascular/Lymphatic: Calcified atherosclerotic disease involves the abdominal aorta. No aneurysm. No enlarged retroperitoneal or mesenteric adenopathy. No enlarged pelvic or inguinal lymph nodes.  Reproductive: Prostate gland and seminal vesicles are within normal limits.  Other: There is no ascites  or focal fluid collections within the abdomen or pelvis. Postoperative changes around the umbilicus identified. There is associated skin thickening and fat stranding identified. No discrete fluid collection identified.  Musculoskeletal: Degenerative disc disease noted within the lumbar spine.  Review of the MIP images confirms the above findings.  IMPRESSION: 1. No evidence for acute pulmonary embolus. 2. Fat stranding and skin thickening around the umbilicus is noted. Likely postoperative. Cellulitis cannot be excluded. No discrete abscess. 3. Postoperative changes status post coli cystectomy. No fluid collection identified within the gallbladder fossa.   Electronically Signed   By: Kerby Moors M.D.   On: 09/27/2014 19:31    Scheduled Meds:  Scheduled Meds: . canagliflozin  100 mg Oral Daily  . clindamycin (CLEOCIN) IV  900 mg Intravenous 3 times per day  . enoxaparin (LOVENOX) injection  65 mg Subcutaneous Q24H  . famotidine  20 mg Oral BID  . glipiZIDE  5 mg Oral Q breakfast  . insulin aspart  0-20 Units Subcutaneous TID WC  . metFORMIN  1,000 mg Oral BID WC   Continuous Infusions:   Time spent on care of this patient: 105 min   [place], MD 09/28/2014, 7:49 AM  LOS: 1 day   [facility] Office  [phone] Pager - Text Page per [email or web address] If 7PM-7AM, please contact night-coverage [email or web address]

## 2014-09-28 NOTE — Consult Note (Addendum)
Referred by:  [facility]  Reason for referral: Left great toe black tip  History of Present Illness  Lee Reid is a 59 y.o. ([birth year]) male who presents with chief complaint: black tip of left great toe.  This patient returns to hospital after a recent laparoscopic cholecystectomy with possible cellulitis at a trocar site.  The patient reported also noticed on Friday morning that the tip of his left great toe was black.  He denies any injury of that toe.  This patient denies any intermittent claudication or rest pain history.  He also denies any diabetic neuropathy and recent cardiac arrhythmia.  His atherosclerotic risk factors include: DM and active smoking   Past Medical History  Diagnosis Date  . Diabetes mellitus without complication   . Anginal pain   . Heart murmur     born with heart murmur  . Dysrhythmia     irregular heartbeat due to stress  . Asthma   . Pneumonia   . Depression   . Anxiety   . GERD (gastroesophageal reflux disease)     Past Surgical History  Procedure Laterality Date  . Tonsillectomy    . Cholecystectomy N/A 09/16/2014    Procedure: LAPAROSCOPIC CHOLECYSTECTOMY ;  Surgeon: Arta Bruce Kinsinger, MD;  Location: [facility];  Service: General;  Laterality: N/A;    Social History   Social History  . Marital Status: Married    Spouse Name: N/A  . Number of Children: N/A  . Years of Education: N/A   Occupational History  . Not on file.   Social History Main Topics  . Smoking status: Current Every Day Smoker -- 0.50 packs/day    Types: Cigarettes  . Smokeless tobacco: Never Used  . Alcohol Use: No  . Drug Use: No  . Sexual Activity: Not on file   Other Topics Concern  . Not on file   Social History Narrative    Family History  Problem Relation Age of Onset  . Hypertension Mother   . Heart disease Father   . Cancer Father     Current Facility-Administered Medications  Medication Dose Route Frequency Provider Last Rate  Last Dose  . aspirin chewable tablet 162 mg  162 mg Oral Daily PRN Reubin Milan, MD      . canagliflozin [facility]) tablet 100 mg  100 mg Oral Daily Reubin Milan, MD      . clindamycin (CLEOCIN) IVPB 900 mg  900 mg Intravenous 3 times per day Reubin Milan, MD   900 mg at 09/28/14 0618  . enoxaparin (LOVENOX) injection 65 mg  65 mg Subcutaneous Q24H Reubin Milan, MD      . famotidine (PEPCID) tablet 20 mg  20 mg Oral BID Reubin Milan, MD   20 mg at 09/28/14 0042  . glipiZIDE (GLUCOTROL XL) 24 hr tablet 5 mg  5 mg Oral Q breakfast Reubin Milan, MD   5 mg at 09/28/14 0850  . insulin aspart (novoLOG) injection 0-20 Units  0-20 Units Subcutaneous TID WC Reubin Milan, MD   4 Units at 09/28/14 [phone]  . metFORMIN (GLUCOPHAGE) tablet 1,000 mg  1,000 mg Oral BID WC Reubin Milan, MD   1,000 mg at 09/28/14 0850  . ondansetron (ZOFRAN) tablet 4 mg  4 mg Oral Q6H PRN Reubin Milan, MD       Or  . ondansetron [facility]) injection 4 mg  4 mg Intravenous Q6H PRN Reubin Milan,  MD   4 mg at 09/28/14 3009  . oxyCODONE-acetaminophen (PERCOCET/ROXICET) 5-325 MG per tablet 1-2 tablet  1-2 tablet Oral Q4H PRN Reubin Milan, MD   2 tablet at 09/28/14 [phone]     No Known Allergies  REVIEW OF SYSTEMS:  (Positives checked otherwise negative)  CARDIOVASCULAR:   [ ]  chest pain,  [ ]  chest pressure,  [ ]  palpitations,  [ ]  shortness of breath when laying flat,  [ ]  shortness of breath with exertion,   [ ]  pain in feet when walking,  [ ]  pain in feet when laying flat, [ ]  history of blood clot in veins (DVT),  [ ]  history of phlebitis,  [ ]  swelling in legs,  [ ]  varicose veins  PULMONARY:   [ ]  productive cough,  [ ]  asthma,  [ ]  wheezing  NEUROLOGIC:   [ ]  weakness in arms or legs,  [ ]  numbness in arms or legs,  [ ]  difficulty speaking or slurred speech,  [ ]  temporary loss of vision in one eye,  [ ]  dizziness  HEMATOLOGIC:   [ ]  bleeding  problems,  [ ]  problems with blood clotting too easily  MUSCULOSKEL:   [ ]  joint pain, [ ]  joint swelling [x]  Black left great toe  GASTROINTEST:   [ ]  vomiting blood,  [ ]  blood in stool     GENITOURINARY:   [ ]  burning with urination,  [ ]  blood in urine  PSYCHIATRIC:   [ ]  history of major depression  INTEGUMENTARY:   [ ]  rashes,  [ ]  ulcers [x]  cellulitis  CONSTITUTIONAL:   [ ]  fever,  [ ]  chills   For VQI Use Only   PRE-ADM LIVING: Home  AMB STATUS: Ambulatory  CAD Sx: None  PRIOR CHF: None   STRESS TEST: [x]  No, [ ]  Normal, [ ]  + ischemia, [ ]  + MI, [ ]  Both    Physical Examination  Filed Vitals:   09/27/14 2207 09/27/14 2316 09/28/14 0614 09/28/14 0800  BP: 126/87 133/87 116/68 113/80  Pulse: 88 78 72 70  Temp: 98.2 F (36.8 C) 98.2 F (36.8 C) 98.3 F (36.8 C) 97.8 F (36.6 C)  TempSrc: Oral Oral Oral Oral  Resp: 19 20 17 16   Height:      Weight:      SpO2: 97% 99% 96% 96%    Body mass index is 39.46 kg/(m^2).  General: A&O x 3, WD, obese  Head: Bear/AT  Ear/Nose/Throat: Hearing grossly intact, nares w/o erythema or drainage, oropharynx w/o Erythema/Exudate, Mallampati score: 3  Eyes: PERRLA, EOMI, Post surg chg to L pupil  Neck: Supple, no nuchal rigidity, no palpable LAD  Pulmonary: Sym exp, good air movt, CTAB, no rales, rhonchi, & wheezing  Cardiac: RRR, Nl S1, S2, no Murmurs, rubs or gallops  Vascular: Vessel Right Left  Radial Palpable Palpable  Brachial Palpable Palpable  Carotid Palpable, without bruit Palpable, without bruit  Aorta Not palpable N/A  Femoral Palpable Palpable  Popliteal Not palpable Not palpable  PT Not Palpable Not Palpable  DP Faintly Palpable Palpable   Gastrointestinal: soft, NTND, no G/R, no HSM, no masses, no CVAT B  Musculoskeletal: M/S 5/5 throughout , Extremities without ischemic changes , L great toe, medial surface of tip with black, echymotic appearance; no evidence of speckled embolic  pattern in rest of toes; evidence of onychomycosis in toe nails, spider veins in both legs, mild amount of LDS in both legs  Neurologic:  CN 2-12 intact , Pain and light touch intact in extremities , Motor exam as listed above  Psychiatric: Judgment intact, Mood & affect appropriate for pt's clinical situation  Dermatologic: See M/S exam for extremity exam, no rashes otherwise noted  Lymph : No Cervical, Axillary, or Inguinal lymphadenopathy    Non-Invasive Vascular Imaging  ABI (Date: 09/28/2014) Ordered  Laboratory: CBC:    Component Value Date/Time   WBC 6.5 09/28/2014 0534   RBC 5.48 09/28/2014 0534   HGB 14.5 09/28/2014 0534   HCT 42.5 09/28/2014 0534   PLT 191 09/28/2014 0534   MCV 77.6* 09/28/2014 0534   MCH 26.5 09/28/2014 0534   MCHC 34.1 09/28/2014 0534   RDW 13.8 09/28/2014 0534   LYMPHSABS 2.7 09/28/2014 0534   MONOABS 0.4 09/28/2014 0534   EOSABS 0.1 09/28/2014 0534   BASOSABS 0.0 09/28/2014 0534    BMP:    Component Value Date/Time   NA 137 09/28/2014 0534   K 4.3 09/28/2014 0534   CL 104 09/28/2014 0534   CO2 26 09/28/2014 0534   GLUCOSE 182* 09/28/2014 0534   BUN 14 09/28/2014 0534   CREATININE 0.93 09/28/2014 0534   CREATININE 0.98 07/29/2014 1056   CALCIUM 9.2 09/28/2014 0534   GFRNONAA >60 09/28/2014 0534   GFRNONAA 85 07/29/2014 1056   GFRAA >60 09/28/2014 0534   GFRAA >89 07/29/2014 1056    Coagulation: No results found for: INR, PROTIME No results found for: PTT  Lipids:    Component Value Date/Time   CHOL 159 07/29/2014 1056   TRIG 118 07/29/2014 1056   HDL 35* 07/29/2014 1056   CHOLHDL 4.5 07/29/2014 1056   VLDL 24 07/29/2014 1056   LDLCALC 100* 07/29/2014 1056     Radiology: Ct Head Wo Contrast  09/27/2014   CLINICAL DATA:  Headache with RIGHT-sided numbness. Auditory changes for 1 day, now resolved.  EXAM: CT HEAD WITHOUT CONTRAST  TECHNIQUE: Contiguous axial images were obtained from the base of the skull through the  vertex without intravenous contrast.  COMPARISON:  CT head 08/28/2013.  FINDINGS: No evidence for acute infarction, hemorrhage, mass lesion, hydrocephalus, or extra-axial fluid. Normal cerebral volume. No white matter disease. Calvarium intact. No sinus or mastoid disease. Dense opacification of the LEFT globe, likely chronic vitreous hemorrhage with tiny posterior globe calcification, stable from priors.  IMPRESSION: No acute intracranial findings. 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   Electronically Signed   By: Staci Righter M.D.   On: 09/27/2014 19:16   Ct Angio Chest Pe W/cm &/or Wo Cm  09/27/2014   CLINICAL DATA:  Periumbilical pain and not following cholecystectomy.  EXAM: CT ANGIOGRAPHY CHEST  CT ABDOMEN AND PELVIS WITH CONTRAST  TECHNIQUE: Multidetector CT imaging of the chest was performed using the standard protocol during bolus administration of intravenous contrast. Multiplanar CT image reconstructions and MIPs were obtained to evaluate the vascular anatomy. Multidetector CT imaging of the abdomen and pelvis was performed using the standard protocol during bolus administration of intravenous contrast.  CONTRAST:  23mL OMNIPAQUE IOHEXOL 300 MG/ML  SOLN  COMPARISON:  None.  FINDINGS: CTA CHEST FINDINGS  Mediastinum: Normal heart size. No pericardial effusion. There is no mediastinal or hilar adenopathy identified. The main pulmonary artery is patent. No lobar or segmental pulmonary artery filling defects to suggest a clinically significant acute pulmonary embolus.  Lungs/Pleura: No pleural effusion. No atelectasis or airspace consolidation.  Musculoskeletal: No worrisome bone abnormality identified.  CT ABDOMEN and PELVIS FINDINGS  Lower chest:  Hepatobiliary: There is no focal liver abnormality. Postoperative changes from cholecystectomy noted. No biliary dilatation.  Pancreas: Negative  Spleen: Small nonspecific low-attenuation structure within the spleen is noted  measuring 11 mm, image 27/series 5.  Adrenals/Urinary Tract: Right adrenal gland myelolipoma noted. The left adrenal gland is normal. Unremarkable appearance of the kidneys. The urinary bladder appears within normal limits.  Stomach/Bowel: The stomach is normal. The small bowel loops have a normal caliber. No obstruction or evidence for ileus. The appendix is visualized and appears normal. Unremarkable appearance of the colon.  Vascular/Lymphatic: Calcified atherosclerotic disease involves the abdominal aorta. No aneurysm. No enlarged retroperitoneal or mesenteric adenopathy. No enlarged pelvic or inguinal lymph nodes.  Reproductive: Prostate gland and seminal vesicles are within normal limits.  Other: There is no ascites or focal fluid collections within the abdomen or pelvis. Postoperative changes around the umbilicus identified. There is associated skin thickening and fat stranding identified. No discrete fluid collection identified.  Musculoskeletal: Degenerative disc disease noted within the lumbar spine.  Review of the MIP images confirms the above findings.  IMPRESSION: 1. No evidence for acute pulmonary embolus. 2. Fat stranding and skin thickening around the umbilicus is noted. Likely postoperative. Cellulitis cannot be excluded. No discrete abscess. 3. Postoperative changes status post coli cystectomy. No fluid collection identified within the gallbladder fossa.   Electronically Signed   By: Kerby Moors M.D.   On: 09/27/2014 19:31   Ct Abdomen Pelvis W Contrast  09/27/2014   CLINICAL DATA:  Periumbilical pain and not following cholecystectomy.  EXAM: CT ANGIOGRAPHY CHEST  CT ABDOMEN AND PELVIS WITH CONTRAST  TECHNIQUE: Multidetector CT imaging of the chest was performed using the standard protocol during bolus administration of intravenous contrast. Multiplanar CT image reconstructions and MIPs were obtained to evaluate the vascular anatomy. Multidetector CT imaging of the abdomen and pelvis was  performed using the standard protocol during bolus administration of intravenous contrast.  CONTRAST:  33mL OMNIPAQUE IOHEXOL 300 MG/ML  SOLN  COMPARISON:  None.  FINDINGS: CTA CHEST FINDINGS  Mediastinum: Normal heart size. No pericardial effusion. There is no mediastinal or hilar adenopathy identified. The main pulmonary artery is patent. No lobar or segmental pulmonary artery filling defects to suggest a clinically significant acute pulmonary embolus.  Lungs/Pleura: No pleural effusion. No atelectasis or airspace consolidation.  Musculoskeletal: No worrisome bone abnormality identified.  CT ABDOMEN and PELVIS FINDINGS  Lower chest:  Hepatobiliary: There is no focal liver abnormality. Postoperative changes from cholecystectomy noted. No biliary dilatation.  Pancreas: Negative  Spleen: Small nonspecific low-attenuation structure within the spleen is noted measuring 11 mm, image 27/series 5.  Adrenals/Urinary Tract: Right adrenal gland myelolipoma noted. The left adrenal gland is normal. Unremarkable appearance of the kidneys. The urinary bladder appears within normal limits.  Stomach/Bowel: The stomach is normal. The small bowel loops have a normal caliber. No obstruction or evidence for ileus. The appendix is visualized and appears normal. Unremarkable appearance of the colon.  Vascular/Lymphatic: Calcified atherosclerotic disease involves the abdominal aorta. No aneurysm. No enlarged retroperitoneal or mesenteric adenopathy. No enlarged pelvic or inguinal lymph nodes.  Reproductive: Prostate gland and seminal vesicles are within normal limits.  Other: There is no ascites or focal fluid collections within the abdomen or pelvis. Postoperative changes around the umbilicus identified. There is associated skin thickening and fat stranding identified. No discrete fluid collection identified.  Musculoskeletal: Degenerative disc disease noted within the lumbar spine.  Review of the MIP images confirms the above findings.  IMPRESSION: 1. No evidence for acute pulmonary embolus. 2. Fat stranding and skin thickening around the umbilicus is noted. Likely postoperative. Cellulitis cannot be excluded. No discrete abscess. 3. Postoperative changes status post coli cystectomy. No fluid collection identified within the gallbladder fossa.   Electronically Signed   By: Kerby Moors M.D.   On: 09/27/2014 19:31     Medical Decision Making  Lee Reid is a 59 y.o. male who presents with: cellulitis at trocar site, likely hematoma L great toe, likely bilateral mild-moderate PAD, chronic venous insufficiency, onychomycosis   I doubt the L great toe is gangrene but he does has some findings consistent with PAD, along with some risk factor.  ABI pending.  Should help quantify the degree of PAD in this patient.  I discussed in depth with the patient the nature of atherosclerosis, and emphasized the importance of maximal medical management including strict control of blood pressure, blood glucose, and lipid levels, antiplatelet agents, obtaining regular exercise, and cessation of smoking.    The patient is aware that without maximal medical management the underlying atherosclerotic disease process will progress, limiting the benefit of any interventions. The patient is currently not on a statin.  While his TC is <200, his LDL >70, which is the target in diabetic with PAD, so I would consider starting the patient on a statin. The patient is currently on an anti-platelet: ASA.  Thank you for allowing [place] to participate in this patient's care.  Will continue to follow up on this patient's vascular lab results.   Adele Barthel, MD [facility] Office: [phone] Pager: [phone]  09/28/2014, 9:51 AM  Addendum    RIGHT    LEFT    PRESSURE WAVEFORM  PRESSURE WAVEFORM  BRACHIAL 116 Triphasic BRACHIAL 113 Triphasic  DP 104 Triphasic DP 129 Triphasic  AT    AT    PT 108 Triphasic PT 141 Triphasic  PER   PER    GREAT TOE  NA GREAT TOE  NA    RIGHT LEFT  ABI 0.93 1.22        ABI suggest no hemodynamically significant PAD in left leg.  No vascular interventions or follow up needed.  Adele Barthel, MD [facility] [place] Office: [phone] Pager: [phone]  09/28/2014, 1:47 PM

## 2014-09-28 NOTE — Progress Notes (Signed)
VASCULAR LAB PRELIMINARY  ARTERIAL  ABI completed:    RIGHT    LEFT    PRESSURE WAVEFORM  PRESSURE WAVEFORM  BRACHIAL 116 Triphasic BRACHIAL 113 Triphasic  DP 104 Triphasic DP 129 Triphasic  AT   AT    PT 108 Triphasic PT 141 Triphasic  PER   PER    GREAT TOE  NA GREAT TOE  NA    RIGHT LEFT  ABI 0.93 1.22   The right ABI is suggestive of mild arterial insufficiency, borderline within normal limits. The left ABI is within normal limits.  09/28/2014 12:11 PM Maudry Mayhew, RVT, RDCS, RDMS

## 2014-09-28 NOTE — Progress Notes (Signed)
Subjective: Sore at umbilicus Otherwise, tolerating diet   Objective: Vital signs in last 24 hours: Temp:  [97.8 F (36.6 C)-98.3 F (36.8 C)] 97.8 F (36.6 C) (09/10 0800) Pulse Rate:  [70-93] 70 (09/10 0800) Resp:  [16-20] 16 (09/10 0800) BP: (113-133)/(68-87) 113/80 mmHg (09/10 0800) SpO2:  [94 %-99 %] 96 % (09/10 0800) Weight:  [135.626 kg (299 lb)] 135.626 kg (299 lb) (09/09 1713) Last BM Date: 09/27/14  Intake/Output from previous day: 09/09 0701 - 09/10 0700 In: 1607 [P.O.:420; IV Piggyback:1050] Out: -  Intake/Output this shift:    General appearance: alert, cooperative and no distress GI: obese; + BS Incisions well-healed with no sign of infection;  Umbilicus is pretty tender but no erythema; firm scarring under incision as would be expected this soon after surgery.  Lab Results:   Recent Labs  09/27/14 1537 09/28/14 0534  WBC 7.2 6.5  HGB 15.4 14.5  HCT 43.9 42.5  PLT 195 191   BMET  Recent Labs  09/27/14 1537 09/28/14 0534  NA 138 137  K 4.4 4.3  CL 104 104  CO2 26 26  GLUCOSE 342* 182*  BUN 14 14  CREATININE 1.27* 0.93  CALCIUM 9.5 9.2   PT/INR No results for input(s): LABPROT, INR in the last 72 hours. ABG No results for input(s): PHART, HCO3 in the last 72 hours.  Invalid input(s): PCO2, PO2  Studies/Results: Ct Head Wo Contrast  09/27/2014   CLINICAL DATA:  Headache with RIGHT-sided numbness. Auditory changes for 1 day, now resolved.  EXAM: CT HEAD WITHOUT CONTRAST  TECHNIQUE: Contiguous axial images were obtained from the base of the skull through the vertex without intravenous contrast.  COMPARISON:  CT head 08/28/2013.  FINDINGS: No evidence for acute infarction, hemorrhage, mass lesion, hydrocephalus, or extra-axial fluid. Normal cerebral volume. No white matter disease. Calvarium intact. No sinus or mastoid disease. Dense opacification of the LEFT globe, likely chronic vitreous hemorrhage with tiny posterior globe calcification,  stable from priors.  IMPRESSION: No acute intracranial findings. 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   Electronically Signed   By: Staci Righter M.D.   On: 09/27/2014 19:16   Ct Angio Chest Pe W/cm &/or Wo Cm  09/27/2014   CLINICAL DATA:  Periumbilical pain and not following cholecystectomy.  EXAM: CT ANGIOGRAPHY CHEST  CT ABDOMEN AND PELVIS WITH CONTRAST  TECHNIQUE: Multidetector CT imaging of the chest was performed using the standard protocol during bolus administration of intravenous contrast. Multiplanar CT image reconstructions and MIPs were obtained to evaluate the vascular anatomy. Multidetector CT imaging of the abdomen and pelvis was performed using the standard protocol during bolus administration of intravenous contrast.  CONTRAST:  27mL OMNIPAQUE IOHEXOL 300 MG/ML  SOLN  COMPARISON:  None.  FINDINGS: CTA CHEST FINDINGS  Mediastinum: Normal heart size. No pericardial effusion. There is no mediastinal or hilar adenopathy identified. The main pulmonary artery is patent. No lobar or segmental pulmonary artery filling defects to suggest a clinically significant acute pulmonary embolus.  Lungs/Pleura: No pleural effusion. No atelectasis or airspace consolidation.  Musculoskeletal: No worrisome bone abnormality identified.  CT ABDOMEN and PELVIS FINDINGS  Lower chest:  Hepatobiliary: There is no focal liver abnormality. Postoperative changes from cholecystectomy noted. No biliary dilatation.  Pancreas: Negative  Spleen: Small nonspecific low-attenuation structure within the spleen is noted measuring 11 mm, image 27/series 5.  Adrenals/Urinary Tract: Right adrenal gland myelolipoma noted. The left adrenal gland is normal. Unremarkable appearance of the kidneys. The urinary  bladder appears within normal limits.  Stomach/Bowel: The stomach is normal. The small bowel loops have a normal caliber. No obstruction or evidence for ileus. The appendix is visualized and appears normal.  Unremarkable appearance of the colon.  Vascular/Lymphatic: Calcified atherosclerotic disease involves the abdominal aorta. No aneurysm. No enlarged retroperitoneal or mesenteric adenopathy. No enlarged pelvic or inguinal lymph nodes.  Reproductive: Prostate gland and seminal vesicles are within normal limits.  Other: There is no ascites or focal fluid collections within the abdomen or pelvis. Postoperative changes around the umbilicus identified. There is associated skin thickening and fat stranding identified. No discrete fluid collection identified.  Musculoskeletal: Degenerative disc disease noted within the lumbar spine.  Review of the MIP images confirms the above findings.  IMPRESSION: 1. No evidence for acute pulmonary embolus. 2. Fat stranding and skin thickening around the umbilicus is noted. Likely postoperative. Cellulitis cannot be excluded. No discrete abscess. 3. Postoperative changes status post coli cystectomy. No fluid collection identified within the gallbladder fossa.   Electronically Signed   By: Kerby Moors M.D.   On: 09/27/2014 19:31   Ct Abdomen Pelvis W Contrast  09/27/2014   CLINICAL DATA:  Periumbilical pain and not following cholecystectomy.  EXAM: CT ANGIOGRAPHY CHEST  CT ABDOMEN AND PELVIS WITH CONTRAST  TECHNIQUE: Multidetector CT imaging of the chest was performed using the standard protocol during bolus administration of intravenous contrast. Multiplanar CT image reconstructions and MIPs were obtained to evaluate the vascular anatomy. Multidetector CT imaging of the abdomen and pelvis was performed using the standard protocol during bolus administration of intravenous contrast.  CONTRAST:  70mL OMNIPAQUE IOHEXOL 300 MG/ML  SOLN  COMPARISON:  None.  FINDINGS: CTA CHEST FINDINGS  Mediastinum: Normal heart size. No pericardial effusion. There is no mediastinal or hilar adenopathy identified. The main pulmonary artery is patent. No lobar or segmental pulmonary artery filling defects  to suggest a clinically significant acute pulmonary embolus.  Lungs/Pleura: No pleural effusion. No atelectasis or airspace consolidation.  Musculoskeletal: No worrisome bone abnormality identified.  CT ABDOMEN and PELVIS FINDINGS  Lower chest:  Hepatobiliary: There is no focal liver abnormality. Postoperative changes from cholecystectomy noted. No biliary dilatation.  Pancreas: Negative  Spleen: Small nonspecific low-attenuation structure within the spleen is noted measuring 11 mm, image 27/series 5.  Adrenals/Urinary Tract: Right adrenal gland myelolipoma noted. The left adrenal gland is normal. Unremarkable appearance of the kidneys. The urinary bladder appears within normal limits.  Stomach/Bowel: The stomach is normal. The small bowel loops have a normal caliber. No obstruction or evidence for ileus. The appendix is visualized and appears normal. Unremarkable appearance of the colon.  Vascular/Lymphatic: Calcified atherosclerotic disease involves the abdominal aorta. No aneurysm. No enlarged retroperitoneal or mesenteric adenopathy. No enlarged pelvic or inguinal lymph nodes.  Reproductive: Prostate gland and seminal vesicles are within normal limits.  Other: There is no ascites or focal fluid collections within the abdomen or pelvis. Postoperative changes around the umbilicus identified. There is associated skin thickening and fat stranding identified. No discrete fluid collection identified.  Musculoskeletal: Degenerative disc disease noted within the lumbar spine.  Review of the MIP images confirms the above findings.  IMPRESSION: 1. No evidence for acute pulmonary embolus. 2. Fat stranding and skin thickening around the umbilicus is noted. Likely postoperative. Cellulitis cannot be excluded. No discrete abscess. 3. Postoperative changes status post coli cystectomy. No fluid collection identified within the gallbladder fossa.   Electronically Signed   By: Kerby Moors M.D.   On: 09/27/2014  19:31     Anti-infectives: Anti-infectives    Start     Dose/Rate Route Frequency Ordered Stop   09/28/14 0600  clindamycin (CLEOCIN) IVPB 900 mg     900 mg 100 mL/hr over 30 Minutes Intravenous 3 times per day 09/28/14 0010     09/27/14 2130  clindamycin (CLEOCIN) IVPB 600 mg     600 mg 100 mL/hr over 30 Minutes Intravenous  Once 09/27/14 2128 09/27/14 2301      Assessment/Plan: S/p Laparoscopic cholecystectomy on 09/16/14  No sign of cellulitis at umbilicus  Patient may use heating pad over this area if needed. Follow-up as scheduled with Dr. Kieth Brightly.   LOS: 1 day    Nellie Pester K. 09/28/2014

## 2014-09-29 LAB — COMPREHENSIVE METABOLIC PANEL
ALT: 43 U/L (ref 17–63)
AST: 23 U/L (ref 15–41)
Albumin: 3.7 g/dL (ref 3.5–5.0)
Alkaline Phosphatase: 63 U/L (ref 38–126)
Anion gap: 8 (ref 5–15)
BUN: 13 mg/dL (ref 6–20)
CO2: 28 mmol/L (ref 22–32)
Calcium: 9.6 mg/dL (ref 8.9–10.3)
Chloride: 104 mmol/L (ref 101–111)
Creatinine, Ser: 0.98 mg/dL (ref 0.61–1.24)
GFR calc Af Amer: >60 mL/min (ref >60)
GFR calc non Af Amer: >60 mL/min (ref >60)
Glucose, Bld: 114 mg/dL — ABNORMAL HIGH (ref 65–99)
Potassium: 4.3 mmol/L (ref 3.5–5.1)
Sodium: 140 mmol/L (ref 135–145)
Total Bilirubin: 0.4 mg/dL (ref 0.3–1.2)
Total Protein: 6.9 g/dL (ref 6.5–8.1)

## 2014-09-29 LAB — CBC WITH DIFFERENTIAL/PLATELET
Basophils Absolute: 0 10*3/uL (ref 0.0–0.1)
Basophils Relative: 0 % (ref 0–1)
Eosinophils Absolute: 0.1 10*3/uL (ref 0.0–0.7)
Eosinophils Relative: 1 % (ref 0–5)
HCT: 41.9 % (ref 39.0–52.0)
Hemoglobin: 14.1 g/dL (ref 13.0–17.0)
Lymphocytes Relative: 46 % (ref 12–46)
Lymphs Abs: 2.6 10*3/uL (ref 0.7–4.0)
MCH: 26.3 pg (ref 26.0–34.0)
MCHC: 33.7 g/dL (ref 30.0–36.0)
MCV: 78 fL (ref 78.0–100.0)
Monocytes Absolute: 0.3 10*3/uL (ref 0.1–1.0)
Monocytes Relative: 5 % (ref 3–12)
Neutro Abs: 2.6 10*3/uL (ref 1.7–7.7)
Neutrophils Relative %: 48 % (ref 43–77)
Platelets: 193 10*3/uL (ref 150–400)
RBC: 5.37 MIL/uL (ref 4.22–5.81)
RDW: 13.8 % (ref 11.5–15.5)
WBC: 5.5 10*3/uL (ref 4.0–10.5)

## 2014-09-29 LAB — GLUCOSE, CAPILLARY: Glucose-Capillary: 110 mg/dL — ABNORMAL HIGH (ref 65–99)

## 2014-09-29 MED ORDER — METFORMIN HCL 1000 MG PO TABS: 1000.0000 mg | ORAL_TABLET | Freq: Two times a day (BID) | ORAL | Status: DC

## 2014-09-29 NOTE — Discharge Summary (Signed)
Physician Discharge Summary  LEVANDER KATZENSTEIN IOX:[id number] DOB: 28-Feb-1955 DOA: 09/27/2014  PCP: Tora Duck, MD  Admit date: 09/27/2014 Discharge date: 10/03/2014  Time spent: 50 minutes   Discharge Condition: stable  Discharge Diagnoses:  Principal Problem:   ARF (acute renal failure) Active Problems:   Type 2 diabetes mellitus without complications   Morbid obesity   S/P cholecystectomy   History of present illness:  Lee Reid is a 59 y.o. male with uncontrolled diabetes mellitus and morbid obesity who presented to the hospital as the tip of his left toe had turned black. He was also found to have some tenderness in the mid abdomen and a CT of the abdomen was done-report was suspicious for cellulitis and he was therefore admitted.   Hospital Course:  FLEMON KELTY is a 59 y.o. male with uncontrolled diabetes mellitus and morbid obesity who presented to the hospital as the tip of his left toe had turned black. He was also found to have some tenderness in the mid abdomen and a CT of the abdomen was done-report was suspicious for cellulitis.   Subjective: States that his belly is a little tender but his main concern is his toe that is black. He is not having any pain in this toe for the past couple of days. Does not recall any trauma to the toe.  Assessment/Plan: Principal Problem:  Cellulitis of abdominal wall? -No erythema or other physical signs of cellulitis- patient examined and CT reviewed by surgery who feels that stranding noted on CT is expected postoperative -Will DC clindamycin  Active Problems:  ARF (acute renal failure) - held Enalapril and Ibuprofen -Resolved  Black left toe -The medial tip of the toe is black and sharply demarcated-I have asked for vascular surgery consult suspects is possibly a hematoma-the patient does not report any injury- bilateral ABIs are normal   Type 2 diabetes mellitus without complications - cont Invokana, Metformin  and Glipizide -he is declining to use insulin in the hospital -Last A1c was 13 and he states this is why Invokana was added- I have explained to him that he will need Insulin to bring A1c under control but he is hesitant to start it at this time   Morbid obesity   S/P cholecystectomy       Consultations:  Gen and vascular surgery  Discharge Exam: Filed Weights   09/27/14 1713  Weight: 135.626 kg (299 lb)   Filed Vitals:   09/29/14 0555  BP: 106/62  Pulse: 63  Temp: 98.5 F (36.9 C)  Resp: 16    General: AAO x 3, no distress Cardiovascular: RRR, no murmurs  Respiratory: clear to auscultation bilaterally GI: soft, non-tender, non-distended, bowel sound positive  Discharge Instructions You were cared for by a hospitalist during your hospital stay. If you have any questions about your discharge medications or the care you received while you were in the hospital after you are discharged, you can call the unit and asked to speak with the hospitalist on call if the hospitalist that took care of you is not available. Once you are discharged, your primary care physician will handle any further medical issues. Please note that NO REFILLS for any discharge medications will be authorized once you are discharged, as it is imperative that you return to your primary care physician (or establish a relationship with a primary care physician if you do not have one) for your aftercare needs so that they can reassess your need for medications and  monitor your lab values.      Discharge Instructions       Discharge instructions    Complete by:  As directed   Diabetic, low sodium heart healthy diet     Increase activity slowly    Complete by:  As directed      Increase activity slowly    Complete by:  As directed             Medication List    STOP taking these medications        oxyCODONE-acetaminophen 5-325 MG per tablet  Commonly known as:  PERCOCET/ROXICET      TAKE these  medications        aspirin 81 MG tablet  Take 162 mg by mouth daily as needed for pain.     canagliflozin 100 MG Tabs tablet  Commonly known as:  INVOKANA  Take 1 tablet (100 mg total) by mouth daily.     glipiZIDE 5 MG 24 hr tablet  Commonly known as:  GLUCOTROL XL  Take 1 tablet (5 mg total) by mouth daily with breakfast.     metFORMIN 1000 MG tablet  Commonly known as:  GLUCOPHAGE  Take 1 tablet (1,000 mg total) by mouth 2 (two) times daily with a meal.       No Known Allergies Follow-up Information    Follow up with Mickeal Skinner, MD In 1 week.   Specialty:  General Surgery   Contact information:   [place] [id number] [phone]        The results of significant diagnostics from this hospitalization (including imaging, microbiology, ancillary and laboratory) are listed below for reference.    Significant Diagnostic Studies: Ct Head Wo Contrast  09/27/2014   CLINICAL DATA:  Headache with RIGHT-sided numbness. Auditory changes for 1 day, now resolved.  EXAM: CT HEAD WITHOUT CONTRAST  TECHNIQUE: Contiguous axial images were obtained from the base of the skull through the vertex without intravenous contrast.  COMPARISON:  CT head 08/28/2013.  FINDINGS: No evidence for acute infarction, hemorrhage, mass lesion, hydrocephalus, or extra-axial fluid. Normal cerebral volume. No white matter disease. Calvarium intact. No sinus or mastoid disease. Dense opacification of the LEFT globe, likely chronic vitreous hemorrhage with tiny posterior globe calcification, stable from priors.  IMPRESSION: No acute intracranial findings. 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   Electronically Signed   By: Staci Righter M.D.   On: 09/27/2014 19:16   Ct Angio Chest Pe W/cm &/or Wo Cm  09/27/2014   CLINICAL DATA:  Periumbilical pain and not following cholecystectomy.  EXAM: CT ANGIOGRAPHY CHEST  CT ABDOMEN AND PELVIS WITH CONTRAST   TECHNIQUE: Multidetector CT imaging of the chest was performed using the standard protocol during bolus administration of intravenous contrast. Multiplanar CT image reconstructions and MIPs were obtained to evaluate the vascular anatomy. Multidetector CT imaging of the abdomen and pelvis was performed using the standard protocol during bolus administration of intravenous contrast.  CONTRAST:  31mL OMNIPAQUE IOHEXOL 300 MG/ML  SOLN  COMPARISON:  None.  FINDINGS: CTA CHEST FINDINGS  Mediastinum: Normal heart size. No pericardial effusion. There is no mediastinal or hilar adenopathy identified. The main pulmonary artery is patent. No lobar or segmental pulmonary artery filling defects to suggest a clinically significant acute pulmonary embolus.  Lungs/Pleura: No pleural effusion. No atelectasis or airspace consolidation.  Musculoskeletal: No worrisome bone abnormality identified.  CT ABDOMEN and PELVIS FINDINGS  Lower chest:  Hepatobiliary: There is no focal liver abnormality. Postoperative changes from cholecystectomy noted. No biliary dilatation.  Pancreas: Negative  Spleen: Small nonspecific low-attenuation structure within the spleen is noted measuring 11 mm, image 27/series 5.  Adrenals/Urinary Tract: Right adrenal gland myelolipoma noted. The left adrenal gland is normal. Unremarkable appearance of the kidneys. The urinary bladder appears within normal limits.  Stomach/Bowel: The stomach is normal. The small bowel loops have a normal caliber. No obstruction or evidence for ileus. The appendix is visualized and appears normal. Unremarkable appearance of the colon.  Vascular/Lymphatic: Calcified atherosclerotic disease involves the abdominal aorta. No aneurysm. No enlarged retroperitoneal or mesenteric adenopathy. No enlarged pelvic or inguinal lymph nodes.  Reproductive: Prostate gland and seminal vesicles are within normal limits.  Other: There is no ascites or focal fluid collections within the abdomen or  pelvis. Postoperative changes around the umbilicus identified. There is associated skin thickening and fat stranding identified. No discrete fluid collection identified.  Musculoskeletal: Degenerative disc disease noted within the lumbar spine.  Review of the MIP images confirms the above findings.  IMPRESSION: 1. No evidence for acute pulmonary embolus. 2. Fat stranding and skin thickening around the umbilicus is noted. Likely postoperative. Cellulitis cannot be excluded. No discrete abscess. 3. Postoperative changes status post coli cystectomy. No fluid collection identified within the gallbladder fossa.   Electronically Signed   By: Kerby Moors M.D.   On: 09/27/2014 19:31   Ct Abdomen Pelvis W Contrast  09/27/2014   CLINICAL DATA:  Periumbilical pain and not following cholecystectomy.  EXAM: CT ANGIOGRAPHY CHEST  CT ABDOMEN AND PELVIS WITH CONTRAST  TECHNIQUE: Multidetector CT imaging of the chest was performed using the standard protocol during bolus administration of intravenous contrast. Multiplanar CT image reconstructions and MIPs were obtained to evaluate the vascular anatomy. Multidetector CT imaging of the abdomen and pelvis was performed using the standard protocol during bolus administration of intravenous contrast.  CONTRAST:  35mL OMNIPAQUE IOHEXOL 300 MG/ML  SOLN  COMPARISON:  None.  FINDINGS: CTA CHEST FINDINGS  Mediastinum: Normal heart size. No pericardial effusion. There is no mediastinal or hilar adenopathy identified. The main pulmonary artery is patent. No lobar or segmental pulmonary artery filling defects to suggest a clinically significant acute pulmonary embolus.  Lungs/Pleura: No pleural effusion. No atelectasis or airspace consolidation.  Musculoskeletal: No worrisome bone abnormality identified.  CT ABDOMEN and PELVIS FINDINGS  Lower chest:  Hepatobiliary: There is no focal liver abnormality. Postoperative changes from cholecystectomy noted. No biliary dilatation.  Pancreas:  Negative  Spleen: Small nonspecific low-attenuation structure within the spleen is noted measuring 11 mm, image 27/series 5.  Adrenals/Urinary Tract: Right adrenal gland myelolipoma noted. The left adrenal gland is normal. Unremarkable appearance of the kidneys. The urinary bladder appears within normal limits.  Stomach/Bowel: The stomach is normal. The small bowel loops have a normal caliber. No obstruction or evidence for ileus. The appendix is visualized and appears normal. Unremarkable appearance of the colon.  Vascular/Lymphatic: Calcified atherosclerotic disease involves the abdominal aorta. No aneurysm. No enlarged retroperitoneal or mesenteric adenopathy. No enlarged pelvic or inguinal lymph nodes.  Reproductive: Prostate gland and seminal vesicles are within normal limits.  Other: There is no ascites or focal fluid collections within the abdomen or pelvis. Postoperative changes around the umbilicus identified. There is associated skin thickening and fat stranding identified. No discrete fluid collection identified.  Musculoskeletal: Degenerative disc disease noted within the lumbar spine.  Review of the MIP images confirms the above findings.  IMPRESSION: 1.  No evidence for acute pulmonary embolus. 2. Fat stranding and skin thickening around the umbilicus is noted. Likely postoperative. Cellulitis cannot be excluded. No discrete abscess. 3. Postoperative changes status post coli cystectomy. No fluid collection identified within the gallbladder fossa.   Electronically Signed   By: Kerby Moors M.D.   On: 09/27/2014 19:31   Dg Foot Complete Right  09/30/2014   CLINICAL DATA:  Right foot pain after injury with 20 lb table  EXAM: RIGHT FOOT COMPLETE - 3+ VIEW  COMPARISON:  None.  FINDINGS: Three views of the right foot submitted. There is cortical irregularity at the base of fifth metatarsal. Nondisplaced fracture cannot be excluded. Clinical correlation is necessary.  IMPRESSION: Cortical irregularity at  the base of fifth metatarsal. Nondisplaced fracture cannot be excluded. Clinical correlation is necessary.   Electronically Signed   By: Lahoma Crocker M.D.   On: 09/30/2014 17:35    Microbiology: No results found for this or any previous visit (from the past 240 hour(s)).   Labs: Basic Metabolic Panel:  Recent Labs Lab 09/27/14 1537 09/28/14 0534 09/29/14 0532  NA 138 137 140  K 4.4 4.3 4.3  CL 104 104 104  CO2 26 26 28   GLUCOSE 342* 182* 114*  BUN 14 14 13   CREATININE 1.27* 0.93 0.98  CALCIUM 9.5 9.2 9.6   Liver Function Tests:  Recent Labs Lab 09/27/14 1537 09/28/14 0534 09/29/14 0532  AST 34 28 23  ALT 55 47 43  ALKPHOS 85 75 63  BILITOT 0.4 0.3 0.4  PROT 7.6 7.0 6.9  ALBUMIN 4.0 3.7 3.7   No results for input(s): LIPASE, AMYLASE in the last 168 hours. No results for input(s): AMMONIA in the last 168 hours. CBC:  Recent Labs Lab 09/27/14 1537 09/28/14 0534 09/29/14 0532  WBC 7.2 6.5 5.5  NEUTROABS  --  3.3 2.6  HGB 15.4 14.5 14.1  HCT 43.9 42.5 41.9  MCV 77.6* 77.6* 78.0  PLT 195 191 193   Cardiac Enzymes:  Recent Labs Lab 09/27/14 1659  TROPONINI <0.03   BNP: BNP (last 3 results) No results for input(s): BNP in the last 8760 hours.  ProBNP (last 3 results) No results for input(s): PROBNP in the last 8760 hours.  CBG:  Recent Labs Lab 09/28/14 0746 09/28/14 1130 09/28/14 1642 09/28/14 1955 09/29/14 0729  GLUCAP 158* 200* 134* 183* 110*       SignedDebbe Odea, MD [facility] 10/03/2014, 10:15 AM

## 2014-09-29 NOTE — Discharge Summary (Signed)
Physician Discharge Summary  Patient ID: Lee Reid MRN: [id number] DOB/AGE: 1955-10-17 59 y.o.  Admit date: 09/27/2014 Discharge date: 09/29/2014  Admission Diagnoses:  Discharge Diagnoses:  Principal Problem:   Cellulitis of abdominal wall Active Problems:   Type 2 diabetes mellitus without complications   Morbid obesity   S/P cholecystectomy   ARF (acute renal failure)   Discharged Condition: good  Hospital Course: Khup was admitted for dehydration and concern for infection. He was rehydrated, improved quickly, once his sugars were under control other symptoms resolved. He is tolerating a regular diet and all wound look clean.  Consults: None  Significant Diagnostic Studies: glc 324 on admission, 110 on discharge  Treatments: iv hydration, restarted home medications  Discharge Exam: Blood pressure 106/62, pulse 63, temperature 98.5 F (36.9 C), temperature source Oral, resp. rate 16, height 6\' 1"  (1.854 m), weight 135.626 kg (299 lb), SpO2 95 %. General appearance: alert, cooperative and no distress Head: Normocephalic, without obvious abnormality, atraumatic Resp: clear to auscultation bilaterally Cardio: regular rate and rhythm, S1, S2 normal, no murmur, click, rub or gallop GI: soft, non-tender; bowel sounds normal; no masses,  no organomegaly  Disposition: 01-Home or Self Care  Discharge Instructions    Diet - low sodium heart healthy    Complete by:  As directed      Discharge instructions    Complete by:  As directed   Diabetic, low sodium heart healthy diet     Increase activity slowly    Complete by:  As directed      Increase activity slowly    Complete by:  As directed             Medication List    TAKE these medications        aspirin 81 MG tablet  Take 162 mg by mouth daily as needed for pain.     canagliflozin 100 MG Tabs tablet  Commonly known as:  INVOKANA  Take 1 tablet (100 mg total) by mouth daily.     glipiZIDE 5 MG 24 hr  tablet  Commonly known as:  GLUCOTROL XL  Take 1 tablet (5 mg total) by mouth daily with breakfast.     metFORMIN 1000 MG tablet  Commonly known as:  GLUCOPHAGE  Take 1 tablet (1,000 mg total) by mouth 2 (two) times daily with a meal.  Start taking on:  09/30/2014     oxyCODONE-acetaminophen 5-325 MG per tablet  Commonly known as:  PERCOCET/ROXICET  Take 1-2 tablets by mouth every 4 (four) hours as needed for moderate pain.         Signed: Arta Bruce Braylon Grenda 09/29/2014, 8:08 AM

## 2014-09-30 ENCOUNTER — Emergency Department (HOSPITAL_COMMUNITY)
Admission: EM | Admit: 2014-09-30 | Discharge: 2014-09-30 | Disposition: A | Discharge status: Home / Self Care | Payer: Medicare Other | Attending: Emergency Medicine | Admitting: Emergency Medicine

## 2014-09-30 ENCOUNTER — Encounter (HOSPITAL_COMMUNITY): Payer: Self-pay

## 2014-09-30 ENCOUNTER — Emergency Department (HOSPITAL_COMMUNITY): Payer: Medicare Other

## 2014-09-30 DIAGNOSIS — Y9389 Activity, other specified: Secondary | ICD-10-CM | POA: Insufficient documentation

## 2014-09-30 DIAGNOSIS — R011 Cardiac murmur, unspecified: Secondary | ICD-10-CM | POA: Diagnosis not present

## 2014-09-30 DIAGNOSIS — S92354A Nondisplaced fracture of fifth metatarsal bone, right foot, initial encounter for closed fracture: Secondary | ICD-10-CM | POA: Insufficient documentation

## 2014-09-30 DIAGNOSIS — Z8659 Personal history of other mental and behavioral disorders: Secondary | ICD-10-CM | POA: Insufficient documentation

## 2014-09-30 DIAGNOSIS — W208XXA Other cause of strike by thrown, projected or falling object, initial encounter: Secondary | ICD-10-CM | POA: Diagnosis not present

## 2014-09-30 DIAGNOSIS — Z8679 Personal history of other diseases of the circulatory system: Secondary | ICD-10-CM | POA: Diagnosis not present

## 2014-09-30 DIAGNOSIS — M79671 Pain in right foot: Secondary | ICD-10-CM | POA: Diagnosis not present

## 2014-09-30 DIAGNOSIS — Y9289 Other specified places as the place of occurrence of the external cause: Secondary | ICD-10-CM | POA: Insufficient documentation

## 2014-09-30 DIAGNOSIS — S92301A Fracture of unspecified metatarsal bone(s), right foot, initial encounter for closed fracture: Secondary | ICD-10-CM

## 2014-09-30 DIAGNOSIS — Z7982 Long term (current) use of aspirin: Secondary | ICD-10-CM | POA: Diagnosis not present

## 2014-09-30 DIAGNOSIS — Z720 Tobacco use: Secondary | ICD-10-CM | POA: Diagnosis not present

## 2014-09-30 DIAGNOSIS — S92351A Displaced fracture of fifth metatarsal bone, right foot, initial encounter for closed fracture: Secondary | ICD-10-CM | POA: Diagnosis not present

## 2014-09-30 DIAGNOSIS — Z8701 Personal history of pneumonia (recurrent): Secondary | ICD-10-CM | POA: Diagnosis not present

## 2014-09-30 DIAGNOSIS — Y998 Other external cause status: Secondary | ICD-10-CM | POA: Insufficient documentation

## 2014-09-30 DIAGNOSIS — Z79899 Other long term (current) drug therapy: Secondary | ICD-10-CM | POA: Diagnosis not present

## 2014-09-30 DIAGNOSIS — J45909 Unspecified asthma, uncomplicated: Secondary | ICD-10-CM | POA: Diagnosis not present

## 2014-09-30 DIAGNOSIS — Z8719 Personal history of other diseases of the digestive system: Secondary | ICD-10-CM | POA: Diagnosis not present

## 2014-09-30 DIAGNOSIS — E119 Type 2 diabetes mellitus without complications: Secondary | ICD-10-CM | POA: Diagnosis not present

## 2014-09-30 DIAGNOSIS — S99921A Unspecified injury of right foot, initial encounter: Secondary | ICD-10-CM | POA: Diagnosis present

## 2014-09-30 LAB — HEMOGLOBIN A1C
Hgb A1c MFr Bld: 9.1 % — ABNORMAL HIGH (ref 4.8–5.6)
Mean Plasma Glucose: 214 mg/dL

## 2014-09-30 MED ORDER — OXYCODONE-ACETAMINOPHEN 5-325 MG PO TABS: 1.0000 | ORAL_TABLET | Freq: Four times a day (QID) | ORAL | Status: DC | PRN

## 2014-09-30 NOTE — ED Notes (Signed)
Patient states he dropped a 25 lb turntable from approx 6 foot in the air on his right foot. Patient c/o numbness and swelling.

## 2014-09-30 NOTE — ED Provider Notes (Signed)
CSN: [id number]     Arrival date & time 09/30/14  1614 History   This chart was scribed for non-physician practitioner Lenn Sink, PA-C working with Deno Etienne, DO by Hilda Lias, ED Scribe. This patient was seen in room WTR5/WTR5 and the patient's care was started at 5:05 PM.  Chief Complaint  Patient presents with  . Foot Injury      The history is provided by the patient. No language interpreter was used.   HPI Comments: Lee Reid is a 59 y.o. male who presents to the Emergency Department complaining of constant right lateral foot pain with associated numbness, swelling, and tingling that has been present since earlier today when pt dropped a DJ turntable on top of his foot while he was moving things around. Pt states that the object was about 6 feet off of the ground when it fell and hit his foot. Pt states he cannot ambulate and is currently in pain sitting down. Pt states he took tylenol today with no relief.   Past Medical History  Diagnosis Date  . Diabetes mellitus without complication   . Anginal pain   . Heart murmur     born with heart murmur  . Dysrhythmia     irregular heartbeat due to stress  . Asthma   . Pneumonia   . Depression   . Anxiety   . GERD (gastroesophageal reflux disease)    Past Surgical History  Procedure Laterality Date  . Tonsillectomy    . Cholecystectomy N/A 09/16/2014    Procedure: LAPAROSCOPIC CHOLECYSTECTOMY ;  Surgeon: Arta Bruce Kinsinger, MD;  Location: [facility];  Service: General;  Laterality: N/A;  . Testicle surgery      growth removed  . Eye surgery     Family History  Problem Relation Age of Onset  . Hypertension Mother   . Heart disease Father   . Cancer Father    Social History  Substance Use Topics  . Smoking status: Current Every Day Smoker -- 0.50 packs/day    Types: Cigarettes  . Smokeless tobacco: Never Used  . Alcohol Use: No    Review of Systems  All other systems reviewed and are  negative.     Allergies  Review of patient's allergies indicates no known allergies.  Home Medications   Prior to Admission medications   Medication Sig Start Date End Date Taking? Authorizing Provider  aspirin 81 MG tablet Take 162 mg by mouth daily as needed for pain.    Historical Provider, MD  canagliflozin (INVOKANA) 100 MG TABS tablet Take 1 tablet (100 mg total) by mouth daily. 08/29/14   Tresa Garter, MD  glipiZIDE (GLUCOTROL XL) 5 MG 24 hr tablet Take 1 tablet (5 mg total) by mouth daily with breakfast. 08/02/14   Tresa Garter, MD  metFORMIN (GLUCOPHAGE) 1000 MG tablet Take 1 tablet (1,000 mg total) by mouth 2 (two) times daily with a meal. 09/30/14   Debbe Odea, MD  oxyCODONE-acetaminophen (PERCOCET/ROXICET) 5-325 MG per tablet Take 1 tablet by mouth every 6 (six) hours as needed for severe pain. 09/30/14   Okey Regal, PA-C   BP 112/68 mmHg  Pulse 99  Temp(Src) 98.9 F (37.2 C) (Oral)  Resp 18  Ht 6\' 1"  (1.854 m)  Wt 295 lb (133.811 kg)  BMI 38.93 kg/m2  SpO2 99% Physical Exam  Constitutional: He is oriented to person, place, and time. He appears well-developed and well-nourished.  HENT:  Head: Normocephalic and atraumatic.  Cardiovascular:  Normal rate.   Pulmonary/Chest: Effort normal.  Abdominal: He exhibits no distension.  Musculoskeletal:  Obvious swelling to right fifth metatarsal  Sensation intact Capillary refill less than 3 seconds Ankle non tender to palpation Atraumatic Pt unable to ambulate  Neurological: He is alert and oriented to person, place, and time.  Skin: Skin is warm and dry.  Psychiatric: He has a normal mood and affect.  Nursing note and vitals reviewed.   ED Course  Procedures (including critical care time)  DIAGNOSTIC STUDIES: Oxygen Saturation is 97% on room air, normal by my interpretation.    COORDINATION OF CARE: 5:08 PM Discussed treatment plan with pt at bedside and pt agreed to plan.   Labs Review Labs  Reviewed - No data to display  Imaging Review No results found. I have personally reviewed and evaluated these images and lab results as part of my medical decision-making.   EKG Interpretation None      MDM   Final diagnoses:  Metatarsal fracture, right, closed, initial encounter   Labs:  Imaging:DG foot complete, cortical irregularity at the base of the fifth metatarsal nondisplaced fracture cannot be excluded  Consults:  Therapeutics:  Discharge Meds:   Assessment/Plan: patient presents with a likely nondisplaced fracture at the base of the fifth metatarsal. He will be given a walking boot, crutches, follow-up with orthopedist for further evaluation and management. Strict return precautions given, patient verbalized understanding and agreement for today's plan.       I personally performed the services described in this documentation, which was scribed in my presence. The recorded information has been reviewed and is accurate.     Okey Regal, PA-C 10/04/14 [place], DO 10/05/14 [phone]

## 2014-09-30 NOTE — Discharge Instructions (Signed)
°  Please follow-up with your primary care provider and orthopedic specialist for further evaluation and management.Please use medication only as directed, do not drink drive or operate machine while taking.

## 2014-10-07 ENCOUNTER — Emergency Department (HOSPITAL_COMMUNITY): Payer: Medicare Other

## 2014-10-07 ENCOUNTER — Emergency Department (HOSPITAL_COMMUNITY)
Admission: EM | Admit: 2014-10-07 | Discharge: 2014-10-07 | Disposition: A | Discharge status: Home Health | Payer: Medicare Other | Attending: Emergency Medicine | Admitting: Emergency Medicine

## 2014-10-07 ENCOUNTER — Encounter (HOSPITAL_COMMUNITY): Payer: Self-pay

## 2014-10-07 DIAGNOSIS — Z7982 Long term (current) use of aspirin: Secondary | ICD-10-CM | POA: Insufficient documentation

## 2014-10-07 DIAGNOSIS — S40012A Contusion of left shoulder, initial encounter: Secondary | ICD-10-CM | POA: Diagnosis not present

## 2014-10-07 DIAGNOSIS — S4992XA Unspecified injury of left shoulder and upper arm, initial encounter: Secondary | ICD-10-CM | POA: Diagnosis present

## 2014-10-07 DIAGNOSIS — M545 Low back pain, unspecified: Secondary | ICD-10-CM

## 2014-10-07 DIAGNOSIS — R011 Cardiac murmur, unspecified: Secondary | ICD-10-CM | POA: Insufficient documentation

## 2014-10-07 DIAGNOSIS — Z720 Tobacco use: Secondary | ICD-10-CM | POA: Diagnosis not present

## 2014-10-07 DIAGNOSIS — S9001XA Contusion of right ankle, initial encounter: Secondary | ICD-10-CM | POA: Insufficient documentation

## 2014-10-07 DIAGNOSIS — Z8701 Personal history of pneumonia (recurrent): Secondary | ICD-10-CM | POA: Insufficient documentation

## 2014-10-07 DIAGNOSIS — S9031XA Contusion of right foot, initial encounter: Secondary | ICD-10-CM

## 2014-10-07 DIAGNOSIS — S3992XA Unspecified injury of lower back, initial encounter: Secondary | ICD-10-CM | POA: Diagnosis not present

## 2014-10-07 DIAGNOSIS — E119 Type 2 diabetes mellitus without complications: Secondary | ICD-10-CM | POA: Diagnosis not present

## 2014-10-07 DIAGNOSIS — Z79899 Other long term (current) drug therapy: Secondary | ICD-10-CM | POA: Insufficient documentation

## 2014-10-07 DIAGNOSIS — W07XXXA Fall from chair, initial encounter: Secondary | ICD-10-CM | POA: Diagnosis not present

## 2014-10-07 DIAGNOSIS — Y998 Other external cause status: Secondary | ICD-10-CM | POA: Diagnosis not present

## 2014-10-07 DIAGNOSIS — I209 Angina pectoris, unspecified: Secondary | ICD-10-CM | POA: Diagnosis not present

## 2014-10-07 DIAGNOSIS — Z8659 Personal history of other mental and behavioral disorders: Secondary | ICD-10-CM | POA: Insufficient documentation

## 2014-10-07 DIAGNOSIS — Y9389 Activity, other specified: Secondary | ICD-10-CM | POA: Insufficient documentation

## 2014-10-07 DIAGNOSIS — Y9289 Other specified places as the place of occurrence of the external cause: Secondary | ICD-10-CM | POA: Diagnosis not present

## 2014-10-07 DIAGNOSIS — J45909 Unspecified asthma, uncomplicated: Secondary | ICD-10-CM | POA: Diagnosis not present

## 2014-10-07 DIAGNOSIS — M7989 Other specified soft tissue disorders: Secondary | ICD-10-CM | POA: Diagnosis not present

## 2014-10-07 MED ORDER — OXYCODONE-ACETAMINOPHEN 5-325 MG PO TABS
1.0000 | ORAL_TABLET | Freq: Once | ORAL | Status: AC
  Administered 2014-10-07: 1 via ORAL
  Filled 2014-10-07: qty 1

## 2014-10-07 MED ORDER — IBUPROFEN 200 MG PO TABS
600.0000 mg | ORAL_TABLET | Freq: Once | ORAL | Status: AC
  Administered 2014-10-07: 600 mg via ORAL
  Filled 2014-10-07: qty 3

## 2014-10-07 MED ORDER — METHOCARBAMOL 750 MG PO TABS: 750.0000 mg | ORAL_TABLET | Freq: Three times a day (TID) | ORAL | Status: AC

## 2014-10-07 MED ORDER — HYDROCODONE-ACETAMINOPHEN 5-325 MG PO TABS: 1.0000 | ORAL_TABLET | Freq: Four times a day (QID) | ORAL | Status: DC | PRN

## 2014-10-07 MED ORDER — METHOCARBAMOL 500 MG PO TABS
750.0000 mg | ORAL_TABLET | Freq: Once | ORAL | Status: AC
  Administered 2014-10-07: 750 mg via ORAL
  Filled 2014-10-07: qty 2

## 2014-10-07 MED ORDER — IBUPROFEN 600 MG PO TABS: 600.0000 mg | ORAL_TABLET | Freq: Four times a day (QID) | ORAL | Status: DC | PRN

## 2014-10-07 NOTE — ED Notes (Signed)
Pt verbalized understanding of discharge papers and had no further questions.

## 2014-10-07 NOTE — ED Notes (Signed)
Pt states fell out of shopping scooter at [facility] yesterday.  Pt c/o left shoulder pain and lower back.  Pt also having pain to right foot which he has injured previously

## 2014-10-07 NOTE — ED Provider Notes (Signed)
CSN: [id number]     Arrival date & time 10/07/14  1142 History   First MD Initiated Contact with Patient 10/07/14 1535     Chief Complaint  Patient presents with  . Fall  . Shoulder Pain     (Consider location/radiation/quality/duration/timing/severity/associated sxs/prior Treatment) HPI Comments: Patient states he was in a motorized shopping cart he stood to get an item and when he sat back down the back of the seat broke causing him to fall out of the seat to a hard surface This occurred last evening He also states that he re injured his R foot that was recently broken.  He has not taken any medication for his discomfort  Patient is a 59 y.o. male presenting with fall and shoulder pain. The history is provided by the patient.  Fall This is a new problem. The current episode started yesterday. The problem occurs constantly. The problem has been unchanged. Pertinent negatives include no chest pain, headaches, joint swelling or neck pain. The symptoms are aggravated by walking. He has tried nothing for the symptoms. The treatment provided no relief.  Shoulder Pain Location:  Shoulder Time since incident:  1 day Injury: yes   Mechanism of injury: fall   Fall:    Fall occurred: from a motorized shopping cart.   Height of fall:  2 feet   Impact surface:  Hard floor   Point of impact:  Unable to specify   Entrapped after fall: no   Shoulder location:  L shoulder Pain details:    Quality:  Aching   Radiates to:  Does not radiate   Severity:  Mild   Onset quality:  Sudden   Duration:  1 day   Timing:  Constant   Progression:  Unchanged Chronicity:  New Handedness:  Right-handed Dislocation: no   Worsened by:  Movement Ineffective treatments:  None tried Associated symptoms: back pain   Associated symptoms: no neck pain     Past Medical History  Diagnosis Date  . Diabetes mellitus without complication   . Anginal pain   . Heart murmur     born with heart murmur  .  Dysrhythmia     irregular heartbeat due to stress  . Asthma   . Pneumonia   . Depression   . Anxiety   . GERD (gastroesophageal reflux disease)    Past Surgical History  Procedure Laterality Date  . Tonsillectomy    . Cholecystectomy N/A 09/16/2014    Procedure: LAPAROSCOPIC CHOLECYSTECTOMY ;  Surgeon: Arta Bruce Kinsinger, MD;  Location: [facility];  Service: General;  Laterality: N/A;  . Testicle surgery      growth removed  . Eye surgery     Family History  Problem Relation Age of Onset  . Hypertension Mother   . Heart disease Father   . Cancer Father    Social History  Substance Use Topics  . Smoking status: Current Every Day Smoker -- 0.50 packs/day    Types: Cigarettes  . Smokeless tobacco: Never Used  . Alcohol Use: No    Review of Systems  Cardiovascular: Negative for chest pain.  Musculoskeletal: Positive for back pain. Negative for joint swelling and neck pain.  Skin: Positive for color change.  Neurological: Negative for dizziness and headaches.  All other systems reviewed and are negative.     Allergies  Review of patient's allergies indicates no known allergies.  Home Medications   Prior to Admission medications   Medication Sig Start Date End Date Taking?  Authorizing Provider  aspirin 81 MG tablet Take 162 mg by mouth daily as needed for pain.   Yes Historical Provider, MD  canagliflozin (INVOKANA) 100 MG TABS tablet Take 1 tablet (100 mg total) by mouth daily. 08/29/14  Yes Tresa Garter, MD  glipiZIDE (GLUCOTROL XL) 5 MG 24 hr tablet Take 1 tablet (5 mg total) by mouth daily with breakfast. 08/02/14  Yes Tresa Garter, MD  metFORMIN (GLUCOPHAGE) 1000 MG tablet Take 1 tablet (1,000 mg total) by mouth 2 (two) times daily with a meal. 09/30/14  Yes Debbe Odea, MD  oxyCODONE-acetaminophen (PERCOCET/ROXICET) 5-325 MG per tablet Take 1 tablet by mouth every 6 (six) hours as needed for severe pain. 09/30/14  Yes Jeffrey Hedges, PA-C    HYDROcodone-acetaminophen (NORCO/VICODIN) 5-325 MG per tablet Take 1 tablet by mouth every 6 (six) hours as needed for severe pain. 10/07/14   Junius Creamer, NP  ibuprofen (ADVIL,MOTRIN) 600 MG tablet Take 1 tablet (600 mg total) by mouth every 6 (six) hours as needed for moderate pain. 10/07/14   Junius Creamer, NP  methocarbamol (ROBAXIN) 750 MG tablet Take 1 tablet (750 mg total) by mouth 3 (three) times daily. 10/07/14 10/11/14  Junius Creamer, NP   BP 113/81 mmHg  Pulse 86  Temp(Src) 98 F (36.7 C) (Oral)  Resp 18  SpO2 99% Physical Exam  Constitutional: He appears well-developed and well-nourished.  HENT:  Head: Normocephalic.  Eyes: Pupils are equal, round, and reactive to light.  Neck: Normal range of motion.  Cardiovascular: Normal rate and regular rhythm.   Pulmonary/Chest: Effort normal.  Abdominal: Soft.  Musculoskeletal: He exhibits edema and tenderness.       Right ankle: He exhibits decreased range of motion, swelling and ecchymosis. He exhibits no deformity, no laceration and normal pulse. Tenderness.       Feet:  Neurological: He is alert.  Skin: Skin is warm.  Nursing note and vitals reviewed.   ED Course  Procedures (including critical care time) Labs Review Labs Reviewed - No data to display  Imaging Review Dg Foot Complete Right  10/07/2014   CLINICAL DATA:  Fall from shopping scooter yesterday with right foot pain, initial encounter  EXAM: RIGHT FOOT COMPLETE - 3+ VIEW  COMPARISON:  09/30/14  FINDINGS: There is again mild cortical irregularity at the base of the fifth metatarsal. No other metatarsal abnormality is seen. No other fractures are noted. Mild soft tissue swelling is noted along the metatarsal heads.  IMPRESSION: Stable changes at the base of the fifth metatarsal.  Soft tissue swelling   Electronically Signed   By: Inez Catalina M.D.   On: 10/07/2014 16:33   I have personally reviewed and evaluated these images and lab results as part of my medical  decision-making.   EKG Interpretation None     Xray reviewed no new fracture will wrap in ACE for comfort  MDM   Final diagnoses:  Fall from chair, initial encounter  Foot contusion, right, initial encounter  Shoulder contusion, left, initial encounter  Left-sided low back pain without sciatica         Junius Creamer, NP 10/07/14 1704  Junius Creamer, NP 10/07/14 [place], MD 10/07/14 2317

## 2014-10-07 NOTE — Discharge Instructions (Signed)
Your xray shows that you have no new fractures and the fracture in indeed healing  Wear the ACE wrap for comfort as well as the post operative boot previously provided

## 2014-10-10 ENCOUNTER — Ambulatory Visit (HOSPITAL_BASED_OUTPATIENT_CLINIC_OR_DEPARTMENT_OTHER): Payer: Medicare Other

## 2014-10-22 ENCOUNTER — Encounter (HOSPITAL_BASED_OUTPATIENT_CLINIC_OR_DEPARTMENT_OTHER): Payer: Medicare Other

## 2014-10-24 ENCOUNTER — Ambulatory Visit: Payer: Medicare Other | Admitting: Internal Medicine

## 2014-12-03 ENCOUNTER — Ambulatory Visit: Payer: Medicare Other | Admitting: Podiatry

## 2015-06-25 IMAGING — CR DG CHEST 2V
2 series · 2 of 2 positions shown · non-contrast
Comparison: None.

CLINICAL DATA: Chest pain

EXAM:
CHEST  2 VIEW

[w chest pa]
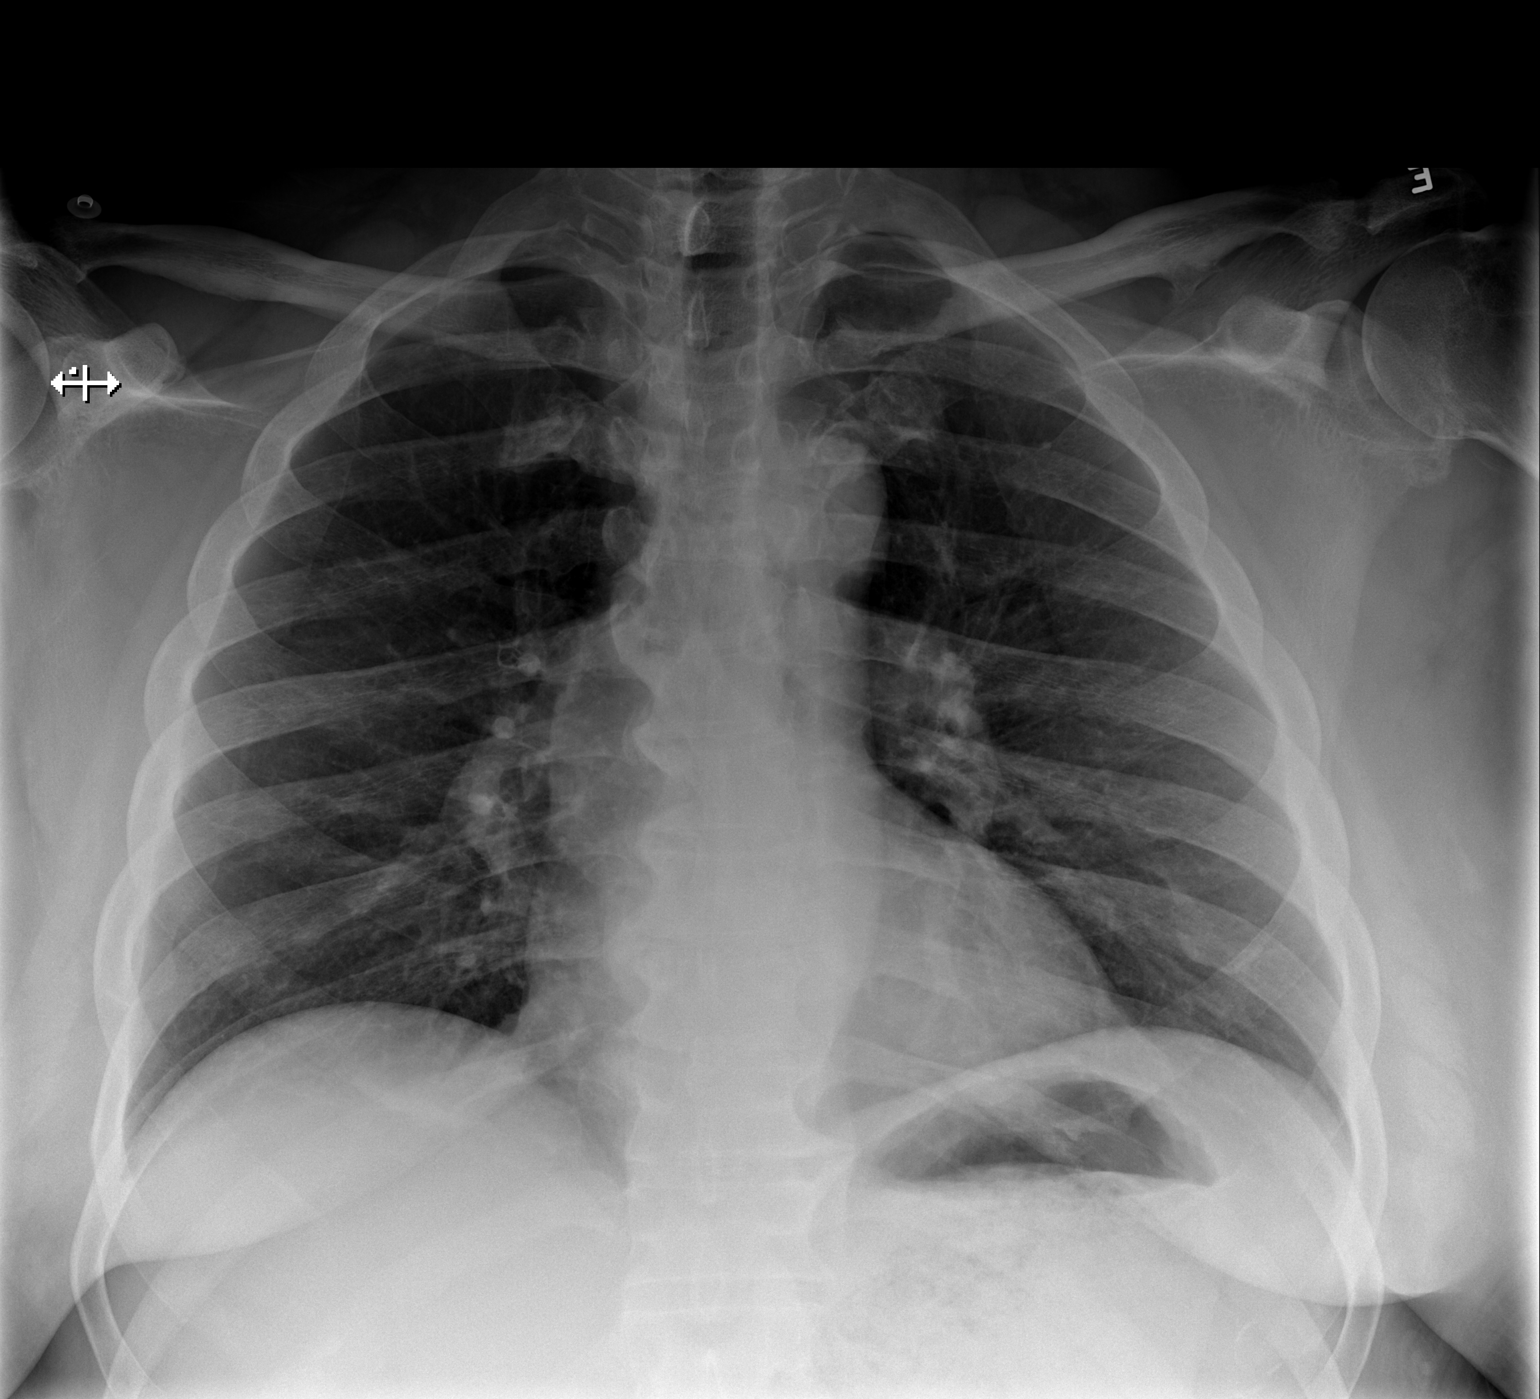

[w chest lat]
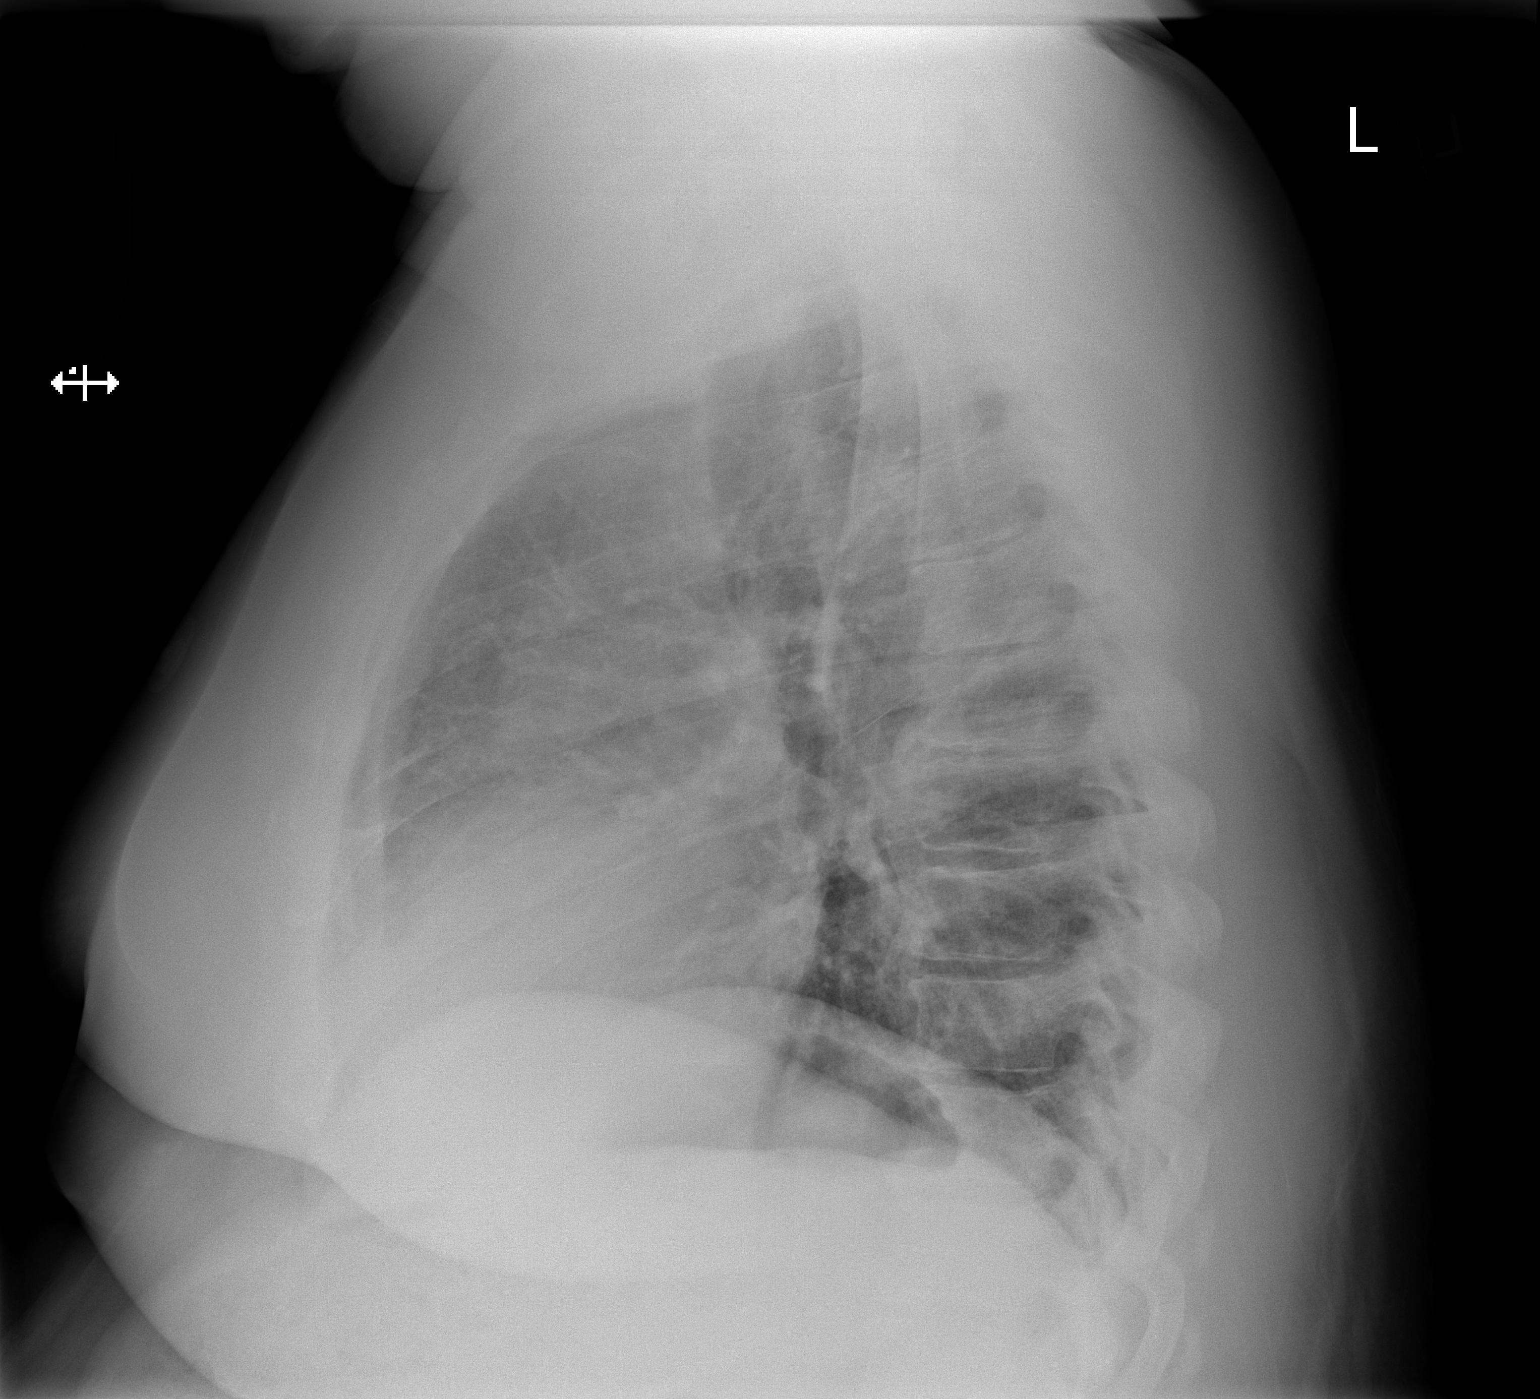

[2 of 2 positions shown; findings below may reference images not displayed]

FINDINGS: The cardiac and mediastinal silhouettes are within normal limits.

The lungs are normally inflated. No airspace consolidation, pleural
effusion, or pulmonary edema is identified. There is no
pneumothorax.

No acute osseous abnormality identified. Multilevel degenerative
changes are noted within the visualized spine.
IMPRESSION: No active cardiopulmonary disease.

## 2015-07-01 ENCOUNTER — Encounter (HOSPITAL_COMMUNITY): Payer: Self-pay | Admitting: Family Medicine

## 2015-07-01 ENCOUNTER — Emergency Department (HOSPITAL_COMMUNITY)
Admission: EM | Admit: 2015-07-01 | Discharge: 2015-07-02 | Disposition: A | Discharge status: Home / Self Care | Payer: Medicare Other | Attending: Emergency Medicine | Admitting: Emergency Medicine

## 2015-07-01 DIAGNOSIS — R234 Changes in skin texture: Secondary | ICD-10-CM

## 2015-07-01 DIAGNOSIS — L03116 Cellulitis of left lower limb: Secondary | ICD-10-CM | POA: Diagnosis not present

## 2015-07-01 DIAGNOSIS — F17210 Nicotine dependence, cigarettes, uncomplicated: Secondary | ICD-10-CM | POA: Insufficient documentation

## 2015-07-01 DIAGNOSIS — F329 Major depressive disorder, single episode, unspecified: Secondary | ICD-10-CM | POA: Diagnosis not present

## 2015-07-01 DIAGNOSIS — Z79899 Other long term (current) drug therapy: Secondary | ICD-10-CM | POA: Diagnosis not present

## 2015-07-01 DIAGNOSIS — J45909 Unspecified asthma, uncomplicated: Secondary | ICD-10-CM | POA: Insufficient documentation

## 2015-07-01 DIAGNOSIS — R42 Dizziness and giddiness: Secondary | ICD-10-CM | POA: Diagnosis not present

## 2015-07-01 DIAGNOSIS — M79662 Pain in left lower leg: Secondary | ICD-10-CM | POA: Diagnosis present

## 2015-07-01 DIAGNOSIS — E119 Type 2 diabetes mellitus without complications: Secondary | ICD-10-CM | POA: Diagnosis not present

## 2015-07-01 DIAGNOSIS — L089 Local infection of the skin and subcutaneous tissue, unspecified: Secondary | ICD-10-CM

## 2015-07-01 NOTE — ED Notes (Signed)
Patient has multiple complaints.Marland Kitchenleft lower extremity swelling with a red, healing wound to the anterior lower leg, right toes are painful, lower back pain, and dizziness. 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

## 2015-07-02 ENCOUNTER — Encounter (HOSPITAL_COMMUNITY): Payer: Self-pay | Admitting: Emergency Medicine

## 2015-07-02 DIAGNOSIS — L03116 Cellulitis of left lower limb: Secondary | ICD-10-CM | POA: Diagnosis not present

## 2015-07-02 LAB — CBG MONITORING, ED: Glucose-Capillary: 159 mg/dL — ABNORMAL HIGH (ref 65–99)

## 2015-07-02 MED ORDER — DOXYCYCLINE HYCLATE 100 MG PO TABS
100.0000 mg | ORAL_TABLET | Freq: Once | ORAL | Status: AC
  Administered 2015-07-02: 100 mg via ORAL
  Filled 2015-07-02: qty 1

## 2015-07-02 MED ORDER — CEPHALEXIN 500 MG PO CAPS: 500.0000 mg | ORAL_CAPSULE | Freq: Four times a day (QID) | ORAL | Status: DC

## 2015-07-02 MED ORDER — IBUPROFEN 800 MG PO TABS
800.0000 mg | ORAL_TABLET | Freq: Once | ORAL | Status: AC
  Administered 2015-07-02: 800 mg via ORAL
  Filled 2015-07-02: qty 1

## 2015-07-02 MED ORDER — NAPROXEN 375 MG PO TABS: 375.0000 mg | ORAL_TABLET | Freq: Two times a day (BID) | ORAL | Status: DC

## 2015-07-02 MED ORDER — DOXYCYCLINE HYCLATE 100 MG PO CAPS: 100.0000 mg | ORAL_CAPSULE | Freq: Two times a day (BID) | ORAL | Status: DC

## 2015-07-02 MED ORDER — CEPHALEXIN 500 MG PO CAPS
500.0000 mg | ORAL_CAPSULE | Freq: Once | ORAL | Status: AC
  Administered 2015-07-02: 500 mg via ORAL
  Filled 2015-07-02: qty 1

## 2015-07-02 MED ORDER — BACITRACIN ZINC 500 UNIT/GM EX OINT
TOPICAL_OINTMENT | Freq: Two times a day (BID) | CUTANEOUS | Status: DC
  Administered 2015-07-02: 1 via TOPICAL
  Filled 2015-07-02: qty 0.9

## 2015-07-02 NOTE — ED Provider Notes (Signed)
CSN: [id number]     Arrival date & time 07/01/15  2331 History  By signing my name below, I, Emmanuella Mensah, attest that this documentation has been prepared under the direction and in the presence of Galileo Colello, MD. Electronically Signed: Judithann Sauger, ED Scribe. 07/02/2015. 2:24 AM.    Chief Complaint  Patient presents with  . Leg Swelling  . Dizziness   Patient is a 60 y.o. male presenting with wound check. The history is provided by the patient. No language interpreter was used.  Wound Check This is a new problem. The current episode started more than 2 days ago. The problem has been gradually improving. Pertinent negatives include no chest pain, no abdominal pain, no headaches and no shortness of breath. Nothing aggravates the symptoms. Nothing relieves the symptoms. He has tried water Programme researcher, broadcasting/film/video and water) for the symptoms. The treatment provided no relief.   HPI Comments: Lee Reid is a 61 y.o. male with a hx of DM who presents to the Emergency Department requesting evaluation for an area of redness with a scabbed region to his anterior left lower leg onset 3 days ago. He explains that he hit his leg on something 2 weeks ago and he noticed the redness to the area 3 days ago. He adds that the area drained pus yesterday. No alleviating factors noted. Pt has cleaned area with soap and water with no relief. No fever or chills.    Past Medical History  Diagnosis Date  . Diabetes mellitus without complication ([place])   . Anginal pain ([place])   . Heart murmur     born with heart murmur  . Dysrhythmia     irregular heartbeat due to stress  . Asthma   . Pneumonia   . Depression   . Anxiety   . GERD (gastroesophageal reflux disease)    Past Surgical History  Procedure Laterality Date  . Tonsillectomy    . Cholecystectomy N/A 09/16/2014    Procedure: LAPAROSCOPIC CHOLECYSTECTOMY ;  Surgeon: Arta Bruce Kinsinger, MD;  Location: [facility];  Service: General;  Laterality: N/A;  .  Testicle surgery      growth removed  . Eye surgery     Family History  Problem Relation Age of Onset  . Hypertension Mother   . Heart disease Father   . Cancer Father    Social History  Substance Use Topics  . Smoking status: Current Every Day Smoker -- 0.50 packs/day    Types: Cigarettes  . Smokeless tobacco: Never Used  . Alcohol Use: No    Review of Systems  Constitutional: Negative for fever and chills.  Respiratory: Negative for shortness of breath.   Cardiovascular: Negative for chest pain.  Gastrointestinal: Negative for abdominal pain.  Musculoskeletal: Negative for back pain, neck pain and neck stiffness.  Skin: Positive for color change and wound.  Neurological: Negative for weakness, numbness and headaches.  All other systems reviewed and are negative.     Allergies  Review of patient's allergies indicates no known allergies.  Home Medications   Prior to Admission medications   Medication Sig Start Date End Date Taking? Authorizing Provider  canagliflozin (INVOKANA) 100 MG TABS tablet Take 1 tablet (100 mg total) by mouth daily. 08/29/14  Yes Tresa Garter, MD  glipiZIDE (GLUCOTROL XL) 5 MG 24 hr tablet Take 1 tablet (5 mg total) by mouth daily with breakfast. 08/02/14  Yes Tresa Garter, MD  HYDROcodone-acetaminophen (NORCO/VICODIN) 5-325 MG per tablet Take 1 tablet by  mouth every 6 (six) hours as needed for severe pain. Patient not taking: Reported on 07/02/2015 10/07/14   Junius Creamer, NP  ibuprofen (ADVIL,MOTRIN) 600 MG tablet Take 1 tablet (600 mg total) by mouth every 6 (six) hours as needed for moderate pain. Patient not taking: Reported on 07/02/2015 10/07/14   Junius Creamer, NP  metFORMIN (GLUCOPHAGE) 1000 MG tablet Take 1 tablet (1,000 mg total) by mouth 2 (two) times daily with a meal. Patient not taking: Reported on 07/02/2015 09/30/14   Debbe Odea, MD  oxyCODONE-acetaminophen (PERCOCET/ROXICET) 5-325 MG per tablet Take 1 tablet by mouth every  6 (six) hours as needed for severe pain. Patient not taking: Reported on 07/02/2015 09/30/14   Okey Regal, PA-C   BP 118/81 mmHg  Pulse 85  Temp(Src) 98.1 F (36.7 C) (Oral)  Resp 20  Ht 6\' 1"  (1.854 m)  Wt 250 lb (113.399 kg)  BMI 32.99 kg/m2  SpO2 99% Physical Exam  Constitutional: He is oriented to person, place, and time. He appears well-developed and well-nourished.  HENT:  Head: Normocephalic and atraumatic.  Mouth/Throat: Oropharynx is clear and moist.  Eyes: EOM are normal. Pupils are equal, round, and reactive to light.  Neck: Normal range of motion. Neck supple.  Cardiovascular: Normal rate and regular rhythm.   Pulmonary/Chest: Effort normal and breath sounds normal. He has no wheezes. He has no rales.  Lungs clear  Abdominal: Soft. Bowel sounds are normal. There is no tenderness. There is no rebound and no guarding.  Musculoskeletal: Normal range of motion. He exhibits no edema or tenderness.       Left knee: Normal.       Left ankle: Normal. Achilles tendon normal.       Right foot: Normal.       Left foot: Normal.  Neurological: He is alert and oriented to person, place, and time.  Skin: Skin is warm and dry.     Psychiatric: He has a normal mood and affect.  Nursing note and vitals reviewed.   ED Course  Procedures (including critical care time) DIAGNOSTIC STUDIES: Oxygen Saturation is 99% on RA, normal by my interpretation.    COORDINATION OF CARE: 2:20 AM- Pt advised of plan for treatment and pt agrees. Pt will receive wound care. Will provide resources for podiatry follow up.    Labs Review Labs Reviewed - No data to display  Imaging Review No results found.   Austin Herd, MD has personally reviewed and evaluated these images and lab results as part of her medical decision-making.   EKG Interpretation None      MDM   Final diagnoses:  None   Filed Vitals:   07/01/15 2347 07/02/15 0252  BP: 118/81 131/89  Pulse: 85 92  Temp: 98.1  F (36.7 C)   Resp: 20 20    Results for orders placed or performed during the hospital encounter of 07/01/15  POC CBG, ED  Result Value Ref Range   Glucose-Capillary 159 (H) 65 - 99 mg/dL   No results found. Medications  bacitracin ointment (1 application Topical Given 07/02/15 0252)  doxycycline (VIBRA-TABS) tablet 100 mg (100 mg Oral Given 07/02/15 0250)  cephALEXin (KEFLEX) capsule 500 mg (500 mg Oral Given 07/02/15 0250)  ibuprofen (ADVIL,MOTRIN) tablet 800 mg (800 mg Oral Given 07/02/15 0327)    Minor skin infection.  Given patient is a diabetic will start abx.  Wound care performed in the ED.  Patient will need to follow up with podiatry for nail  clipping and diabetic foot exam.   Will start doxycycline and keflex and have instructed patient on dressing changes.  Strict return precautions given.  Follow up with your PMD and podiatry.     Informed at discharge by nurse now patient is complaining of back pain and assorted other pains.  Suspect patient is homeless and does not want to be discharged.    Stable for d/c at this time.   I personally performed the services described in this documentation, which was scribed in my presence. The recorded information has been reviewed and is accurate.     Veatrice Kells, MD 07/02/15 0530

## 2015-07-02 NOTE — ED Notes (Signed)
Patient c/o back pain with deep breaths, lower back pain radiating to bilateral legs and toe pain. EDP, Pal[place] made aware of same. No change in POC.

## 2015-07-02 NOTE — Discharge Instructions (Signed)

## 2015-07-07 ENCOUNTER — Emergency Department (HOSPITAL_COMMUNITY)
Admission: EM | Admit: 2015-07-07 | Discharge: 2015-07-08 | Disposition: A | Discharge status: Home / Self Care | Payer: Medicare Other | Attending: Emergency Medicine | Admitting: Emergency Medicine

## 2015-07-07 ENCOUNTER — Encounter (HOSPITAL_COMMUNITY): Payer: Self-pay | Admitting: *Deleted

## 2015-07-07 DIAGNOSIS — F17210 Nicotine dependence, cigarettes, uncomplicated: Secondary | ICD-10-CM | POA: Diagnosis not present

## 2015-07-07 DIAGNOSIS — S90414A Abrasion, right lesser toe(s), initial encounter: Secondary | ICD-10-CM | POA: Diagnosis not present

## 2015-07-07 DIAGNOSIS — X58XXXA Exposure to other specified factors, initial encounter: Secondary | ICD-10-CM | POA: Insufficient documentation

## 2015-07-07 DIAGNOSIS — Y929 Unspecified place or not applicable: Secondary | ICD-10-CM | POA: Diagnosis not present

## 2015-07-07 DIAGNOSIS — M5441 Lumbago with sciatica, right side: Secondary | ICD-10-CM

## 2015-07-07 DIAGNOSIS — R0789 Other chest pain: Secondary | ICD-10-CM | POA: Diagnosis not present

## 2015-07-07 DIAGNOSIS — M7989 Other specified soft tissue disorders: Secondary | ICD-10-CM | POA: Diagnosis not present

## 2015-07-07 DIAGNOSIS — J9809 Other diseases of bronchus, not elsewhere classified: Secondary | ICD-10-CM | POA: Diagnosis not present

## 2015-07-07 DIAGNOSIS — S90422A Blister (nonthermal), left great toe, initial encounter: Secondary | ICD-10-CM | POA: Diagnosis not present

## 2015-07-07 DIAGNOSIS — Z7984 Long term (current) use of oral hypoglycemic drugs: Secondary | ICD-10-CM | POA: Diagnosis not present

## 2015-07-07 DIAGNOSIS — E119 Type 2 diabetes mellitus without complications: Secondary | ICD-10-CM | POA: Diagnosis not present

## 2015-07-07 DIAGNOSIS — S99922A Unspecified injury of left foot, initial encounter: Secondary | ICD-10-CM | POA: Diagnosis present

## 2015-07-07 DIAGNOSIS — Y939 Activity, unspecified: Secondary | ICD-10-CM | POA: Insufficient documentation

## 2015-07-07 DIAGNOSIS — Y999 Unspecified external cause status: Secondary | ICD-10-CM | POA: Diagnosis not present

## 2015-07-07 DIAGNOSIS — M5442 Lumbago with sciatica, left side: Secondary | ICD-10-CM

## 2015-07-07 DIAGNOSIS — J45909 Unspecified asthma, uncomplicated: Secondary | ICD-10-CM | POA: Diagnosis not present

## 2015-07-07 DIAGNOSIS — M5440 Lumbago with sciatica, unspecified side: Secondary | ICD-10-CM | POA: Insufficient documentation

## 2015-07-07 NOTE — ED Notes (Signed)
Patient called for room 3X with no response 

## 2015-07-07 NOTE — ED Notes (Signed)
Pt c/o back pain that radiates to both extremity. Also c/o right leg swelling, states his left leg was more swollen but now it is the right. Pt was at [facility] recently for the same and was given antibiotics and pain medicine but states the pain is the same.

## 2015-07-07 NOTE — ED Notes (Signed)
Patient called 3X for room with no response

## 2015-07-08 ENCOUNTER — Emergency Department (HOSPITAL_COMMUNITY): Payer: Medicare Other

## 2015-07-08 DIAGNOSIS — S90422A Blister (nonthermal), left great toe, initial encounter: Secondary | ICD-10-CM | POA: Diagnosis not present

## 2015-07-08 DIAGNOSIS — M7989 Other specified soft tissue disorders: Secondary | ICD-10-CM | POA: Diagnosis not present

## 2015-07-08 DIAGNOSIS — J9809 Other diseases of bronchus, not elsewhere classified: Secondary | ICD-10-CM | POA: Diagnosis not present

## 2015-07-08 LAB — BASIC METABOLIC PANEL
Anion gap: 5 (ref 5–15)
BUN: 8 mg/dL (ref 6–20)
CO2: 30 mmol/L (ref 22–32)
Calcium: 8.8 mg/dL — ABNORMAL LOW (ref 8.9–10.3)
Chloride: 104 mmol/L (ref 101–111)
Creatinine, Ser: 1.1 mg/dL (ref 0.61–1.24)
GFR calc Af Amer: >60 mL/min (ref >60)
GFR calc non Af Amer: >60 mL/min (ref >60)
Glucose, Bld: 172 mg/dL — ABNORMAL HIGH (ref 65–99)
Potassium: 4.4 mmol/L (ref 3.5–5.1)
Sodium: 139 mmol/L (ref 135–145)

## 2015-07-08 LAB — CBC WITH DIFFERENTIAL/PLATELET
Basophils Absolute: 0 10*3/uL (ref 0.0–0.1)
Basophils Relative: 0 %
Eosinophils Absolute: 0.1 10*3/uL (ref 0.0–0.7)
Eosinophils Relative: 1 %
HCT: 40.3 % (ref 39.0–52.0)
Hemoglobin: 13.5 g/dL (ref 13.0–17.0)
Lymphocytes Relative: 48 %
Lymphs Abs: 2.8 10*3/uL (ref 0.7–4.0)
MCH: 27 pg (ref 26.0–34.0)
MCHC: 33.5 g/dL (ref 30.0–36.0)
MCV: 80.6 fL (ref 78.0–100.0)
Monocytes Absolute: 0.3 10*3/uL (ref 0.1–1.0)
Monocytes Relative: 5 %
Neutro Abs: 2.6 10*3/uL (ref 1.7–7.7)
Neutrophils Relative %: 46 %
Platelets: 120 10*3/uL — ABNORMAL LOW (ref 150–400)
RBC: 5 MIL/uL (ref 4.22–5.81)
RDW: 13.5 % (ref 11.5–15.5)
WBC: 5.7 10*3/uL (ref 4.0–10.5)

## 2015-07-08 MED ORDER — OXYCODONE-ACETAMINOPHEN 5-325 MG PO TABS: 1.0000 | ORAL_TABLET | ORAL | Status: DC | PRN

## 2015-07-08 MED ORDER — GI COCKTAIL ~~LOC~~
30.0000 mL | Freq: Once | ORAL | Status: AC
  Administered 2015-07-08: 30 mL via ORAL

## 2015-07-08 NOTE — Discharge Instructions (Signed)
Nail Ringworm A fungal infection of the nail (tinea unguium/onychomycosis) is common. It is common as the visible part of the nail is composed of dead cells which have no blood supply to help prevent infection. It occurs because fungi are everywhere and will pick any opportunity to grow on any dead material. Because nails are very slow growing they require up to 2 years of treatment with anti-fungal medications. The entire nail back to the base is infected. This includes approximately  of the nail which you cannot see. If your caregiver has prescribed a medication by mouth, take it every day and as directed. No progress will be seen for at least 6 to 9 months. Do not be disappointed! Because fungi live on dead cells with little or no exposure to blood supply, medication delivery to the infection is slow; thus the cure is slow. It is also why you can observe no progress in the first 6 months. The nail becoming cured is the base of the nail, as it has the blood supply. Topical medication such as creams and ointments are usually not effective. Important in successful treatment of nail fungus is closely following the medication regimen that your doctor prescribes. Sometimes you and your caregiver may elect to speed up this process by surgical removal of all the nails. Even this may still require 6 to 9 months of additional oral medications. See your caregiver as directed. Remember there will be no visible improvement for at least 6 months. See your caregiver sooner if other signs of infection (redness and swelling) develop.   This information is not intended to replace advice given to you by your health care provider. Make sure you discuss any questions you have with your health care provider.   Document Released: 01/02/2000 Document Revised: 05/21/2014 Document Reviewed: 07/08/2014 Elsevier Interactive Patient Education 2016 [place] An abrasion is a cut or scrape on the outer surface of your  skin. An abrasion does not extend through all of the layers of your skin. It is important to care for your abrasion properly to prevent infection. CAUSES Most abrasions are caused by falling on or gliding across the ground or another surface. When your skin rubs on something, the outer and inner layer of skin rubs off.  SYMPTOMS A cut or scrape is the main symptom of this condition. The scrape may be bleeding, or it may appear red or pink. If there was an associated fall, there may be an underlying bruise. DIAGNOSIS An abrasion is diagnosed with a physical exam. TREATMENT Treatment for this condition depends on how large and deep the abrasion is. Usually, your abrasion will be cleaned with water and mild soap. This removes any dirt or debris that may be stuck. An antibiotic ointment may be applied to the abrasion to help prevent infection. A bandage (dressing) may be placed on the abrasion to keep it clean. You may also need a tetanus shot. HOME CARE INSTRUCTIONS Medicines  Take or apply medicines only as directed by your health care provider.  If you were prescribed an antibiotic ointment, finish all of it even if you start to feel better. Wound Care  Clean the wound with mild soap and water 2-3 times per day or as directed by your health care provider. Pat your wound dry with a clean towel. Do not rub it.  There are many different ways to close and cover a wound. Follow instructions from your health care provider about:  Wound care.  Dressing changes  and removal.  Check your wound every day for signs of infection. Watch for:  Redness, swelling, or pain.  Fluid, blood, or pus. General Instructions  Keep the dressing dry as directed by your health care provider. Do not take baths, swim, use a hot tub, or do anything that would put your wound underwater until your health care provider approves.  If there is swelling, raise (elevate) the injured area above the level of your heart  while you are sitting or lying down.  Keep all follow-up visits as directed by your health care provider. This is important. SEEK MEDICAL CARE IF:  You received a tetanus shot and you have swelling, severe pain, redness, or bleeding at the injection site.  Your pain is not controlled with medicine.  You have increased redness, swelling, or pain at the site of your wound. SEEK IMMEDIATE MEDICAL CARE IF:  You have a red streak going away from your wound.  You have a fever.  You have fluid, blood, or pus coming from your wound.  You notice a bad smell coming from your wound or your dressing.   This information is not intended to replace advice given to you by your health care provider. Make sure you discuss any questions you have with your health care provider.   Document Released: 10/14/2004 Document Revised: 09/25/2014 Document Reviewed: 01/02/2014 Elsevier Interactive Patient Education 2016 [employer] Back Pain, Adult Back pain is very common in adults.The cause of back pain is rarely dangerous and the pain often gets better over time.The cause of your back pain may not be known. Some common causes of back pain include:  Strain of the muscles or ligaments supporting the spine.  Wear and tear (degeneration) of the spinal disks.  Arthritis.  Direct injury to the back. For many people, back pain may return. Since back pain is rarely dangerous, most people can learn to manage this condition on their own. HOME CARE INSTRUCTIONS Watch your back pain for any changes. The following actions may help to lessen any discomfort you are feeling:  Remain active. It is stressful on your back to sit or stand in one place for long periods of time. Do not sit, drive, or stand in one place for more than 30 minutes at a time. Take short walks on even surfaces as soon as you are able.Try to increase the length of time you walk each day.  Exercise regularly as directed by your health care  provider. Exercise helps your back heal faster. It also helps avoid future injury by keeping your muscles strong and flexible.  Do not stay in bed.Resting more than 1-2 days can delay your recovery.  Pay attention to your body when you bend and lift. The most comfortable positions are those that put less stress on your recovering back. Always use proper lifting techniques, including:  Bending your knees.  Keeping the load close to your body.  Avoiding twisting.  Find a comfortable position to sleep. Use a firm mattress and lie on your side with your knees slightly bent. If you lie on your back, put a pillow under your knees.  Avoid feeling anxious or stressed.Stress increases muscle tension and can worsen back pain.It is important to recognize when you are anxious or stressed and learn ways to manage it, such as with exercise.  Take medicines only as directed by your health care provider. Over-the-counter medicines to reduce pain and inflammation are often the most helpful.Your health care provider may prescribe muscle  relaxant drugs.These medicines help dull your pain so you can more quickly return to your normal activities and healthy exercise.  Apply ice to the injured area:  Put ice in a plastic bag.  Place a towel between your skin and the bag.  Leave the ice on for 20 minutes, 2-3 times a day for the first 2-3 days. After that, ice and heat may be alternated to reduce pain and spasms.  Maintain a healthy weight. Excess weight puts extra stress on your back and makes it difficult to maintain good posture. SEEK MEDICAL CARE IF:  You have pain that is not relieved with rest or medicine.  You have increasing pain going down into the legs or buttocks.  You have pain that does not improve in one week.  You have night pain.  You lose weight.  You have a fever or chills. SEEK IMMEDIATE MEDICAL CARE IF:   You develop new bowel or bladder control problems.  You have  unusual weakness or numbness in your arms or legs.  You develop nausea or vomiting.  You develop abdominal pain.  You feel faint.   This information is not intended to replace advice given to you by your health care provider. Make sure you discuss any questions you have with your health care provider.   Document Released: 01/04/2005 Document Revised: 01/25/2014 Document Reviewed: 05/08/2013 Elsevier Interactive Patient Education 2016 Elsevier Inc. Chest Wall Pain Chest wall pain is pain in or around the bones and muscles of your chest. Sometimes, an injury causes this pain. Sometimes, the cause may not be known. This pain may take several weeks or longer to get better. HOME CARE INSTRUCTIONS  Pay attention to any changes in your symptoms. Take these actions to help with your pain:   Rest as told by your health care provider.   Avoid activities that cause pain. These include any activities that use your chest muscles or your abdominal and side muscles to lift heavy items.   If directed, apply ice to the painful area:  Put ice in a plastic bag.  Place a towel between your skin and the bag.  Leave the ice on for 20 minutes, 2-3 times per day.  Take over-the-counter and prescription medicines only as told by your health care provider.  Do not use tobacco products, including cigarettes, chewing tobacco, and e-cigarettes. If you need help quitting, ask your health care provider.  Keep all follow-up visits as told by your health care provider. This is important. SEEK MEDICAL CARE IF:  You have a fever.  Your chest pain becomes worse.  You have new symptoms. SEEK IMMEDIATE MEDICAL CARE IF:  You have nausea or vomiting.  You feel sweaty or light-headed.  You have a cough with phlegm (sputum) or you cough up blood.  You develop shortness of breath.   This information is not intended to replace advice given to you by your health care provider. Make sure you discuss any  questions you have with your health care provider.   Document Released: 01/04/2005 Document Revised: 09/25/2014 Document Reviewed: 04/01/2014 Elsevier Interactive Patient Education [employer].

## 2015-07-08 NOTE — ED Notes (Signed)
Patient transported to X-ray 

## 2015-07-08 NOTE — ED Provider Notes (Signed)
CSN: [id number]     Arrival date & time 07/07/15  1758 History   First MD Initiated Contact with Patient 07/08/15 0014     Chief Complaint  Patient presents with  . Back Pain     (Consider location/radiation/quality/duration/timing/severity/associated sxs/prior Treatment) HPI Comments: Patient with a history of DM, asthma, depression, GERD presents with multiple complaints. He has right chest pain radiating into the right posterior shoulder and is worse with cough, movement and deep breaths. No injury, fever. He states it causes his right arm to feel heavy.   He complains of swelling to his lower extremities. He was seen at [facility] on 07/02/15 and, at that time, his left leg was swelling more than right. Tonight and for the last couple of days, he states the right is swelling more than the left. He has pain in his feet, also right greater than left and states he has sores on his toes with redness and swelling of the feet. No fever.   He complains of low back pain radiating into bilateral buttocks and posterior legs. He reports previous remote history of a diagnosis of sciatica but no pain "in years". He reports a fall over one month ago where he landed in a sitting position but states he did not have pain until weeks after this fall. No weakness of the lower extremities. No urinary/bowel incontinence. No abdominal pain.  Patient is a 60 y.o. male presenting with back pain. The history is provided by the patient. No language interpreter was used.  Back Pain Associated symptoms: chest pain   Associated symptoms: no abdominal pain, no fever, no numbness and no weakness     Past Medical History  Diagnosis Date  . Diabetes mellitus without complication ([place])   . Anginal pain ([place])   . Heart murmur     born with heart murmur  . Dysrhythmia     irregular heartbeat due to stress  . Asthma   . Pneumonia   . Depression   . Anxiety   . GERD (gastroesophageal reflux  disease)    Past Surgical History  Procedure Laterality Date  . Tonsillectomy    . Cholecystectomy N/A 09/16/2014    Procedure: LAPAROSCOPIC CHOLECYSTECTOMY ;  Surgeon: Arta Bruce Kinsinger, MD;  Location: [facility] ORS;  Service: General;  Laterality: N/A;  . Testicle surgery      growth removed  . Eye surgery     Family History  Problem Relation Age of Onset  . Hypertension Mother   . Heart disease Father   . Cancer Father    Social History  Substance Use Topics  . Smoking status: Current Every Day Smoker -- 0.50 packs/day    Types: Cigarettes  . Smokeless tobacco: Never Used  . Alcohol Use: No    Review of Systems  Constitutional: Negative for fever and chills.  Respiratory: Negative for cough and shortness of breath.        He reports pain with work of breathing in right upper chest and posterior shoulder.   Cardiovascular: Positive for chest pain.  Gastrointestinal: Negative.  Negative for nausea and abdominal pain.  Musculoskeletal: Positive for back pain.       C/O left greater than right foot pain and swelling.  Skin:       Complains of sores on his toes.   Neurological: Negative.  Negative for weakness and numbness.      Allergies  Review of patient's allergies indicates no known allergies.  Home Medications  Prior to Admission medications   Medication Sig Start Date End Date Taking? Authorizing Provider  cephALEXin (KEFLEX) 500 MG capsule Take 1 capsule (500 mg total) by mouth 4 (four) times daily. 07/02/15  Yes April Palumbo, MD  doxycycline (VIBRAMYCIN) 100 MG capsule Take 1 capsule (100 mg total) by mouth 2 (two) times daily. One po bid x 7 days 07/02/15  Yes April Palumbo, MD  canagliflozin (INVOKANA) 100 MG TABS tablet Take 1 tablet (100 mg total) by mouth daily. Patient not taking: Reported on 07/08/2015 08/29/14   Tresa Garter, MD  glipiZIDE (GLUCOTROL XL) 5 MG 24 hr tablet Take 1 tablet (5 mg total) by mouth daily with breakfast. Patient not taking:  Reported on 07/08/2015 08/02/14   Tresa Garter, MD  HYDROcodone-acetaminophen (NORCO/VICODIN) 5-325 MG per tablet Take 1 tablet by mouth every 6 (six) hours as needed for severe pain. Patient not taking: Reported on 07/02/2015 10/07/14   Junius Creamer, NP  ibuprofen (ADVIL,MOTRIN) 600 MG tablet Take 1 tablet (600 mg total) by mouth every 6 (six) hours as needed for moderate pain. Patient not taking: Reported on 07/02/2015 10/07/14   Junius Creamer, NP  metFORMIN (GLUCOPHAGE) 1000 MG tablet Take 1 tablet (1,000 mg total) by mouth 2 (two) times daily with a meal. Patient not taking: Reported on 07/02/2015 09/30/14   Debbe Odea, MD  naproxen (NAPROSYN) 375 MG tablet Take 1 tablet (375 mg total) by mouth 2 (two) times daily. Patient not taking: Reported on 07/08/2015 07/02/15   April Palumbo, MD  oxyCODONE-acetaminophen (PERCOCET/ROXICET) 5-325 MG per tablet Take 1 tablet by mouth every 6 (six) hours as needed for severe pain. Patient not taking: Reported on 07/02/2015 09/30/14   Okey Regal, PA-C   BP 118/79 mmHg  Pulse 88  Temp(Src) 97.4 F (36.3 C) (Oral)  Resp 18  SpO2 99% Physical Exam  Constitutional: He is oriented to person, place, and time. He appears well-developed and well-nourished.  HENT:  Head: Normocephalic.  Neck: Normal range of motion. Neck supple.  Cardiovascular: Normal rate and regular rhythm.   Pulmonary/Chest: Effort normal and breath sounds normal. He has no wheezes. He has no rales. He exhibits tenderness (Right upper chest tenderness. ).  Abdominal: Soft. Bowel sounds are normal. There is no tenderness. There is no rebound and no guarding.  Musculoskeletal: Normal range of motion.  Bilateral foot swelling and erythema, right greater than left. Slightly warm to the touch. There is nail overgrowth and thickening c/w extensive onychomycosis. No calf swelling or tenderness appreciated. No significant midline lumbar tenderness or reproducible sciatic tenderness.    Neurological: He is alert and oriented to person, place, and time. Coordination normal.  Skin: Skin is warm and dry. No rash noted.  Superficial abrasions to dorsal right toes 2-5. There is a blistered wound on plantar great left toe.   Psychiatric: He has a normal mood and affect.    ED Course  Procedures (including critical care time) Labs Review Labs Reviewed  BASIC METABOLIC PANEL  CBC WITH DIFFERENTIAL/PLATELET   Results for orders placed or performed during the hospital encounter of 0000000  Basic metabolic panel  Result Value Ref Range   Sodium 139 135 - 145 mmol/L   Potassium 4.4 3.5 - 5.1 mmol/L   Chloride 104 101 - 111 mmol/L   CO2 30 22 - 32 mmol/L   Glucose, Bld 172 (H) 65 - 99 mg/dL   BUN 8 6 - 20 mg/dL   Creatinine, Ser 1.10 0.61 - 1.24  mg/dL   Calcium 8.8 (L) 8.9 - 10.3 mg/dL   GFR calc non Af Amer >60 >60 mL/min   GFR calc Af Amer >60 >60 mL/min   Anion gap 5 5 - 15  CBC with Differential  Result Value Ref Range   WBC 5.7 4.0 - 10.5 K/uL   RBC 5.00 4.22 - 5.81 MIL/uL   Hemoglobin 13.5 13.0 - 17.0 g/dL   HCT 40.3 39.0 - 52.0 %   MCV 80.6 78.0 - 100.0 fL   MCH 27.0 26.0 - 34.0 pg   MCHC 33.5 30.0 - 36.0 g/dL   RDW 13.5 11.5 - 15.5 %   Platelets 120 (L) 150 - 400 K/uL   Neutrophils Relative % 46 %   Neutro Abs 2.6 1.7 - 7.7 K/uL   Lymphocytes Relative 48 %   Lymphs Abs 2.8 0.7 - 4.0 K/uL   Monocytes Relative 5 %   Monocytes Absolute 0.3 0.1 - 1.0 K/uL   Eosinophils Relative 1 %   Eosinophils Absolute 0.1 0.0 - 0.7 K/uL   Basophils Relative 0 %   Basophils Absolute 0.0 0.0 - 0.1 K/uL   Dg Chest 2 View  07/08/2015  CLINICAL DATA:  Acute onset of back pain, radiating to the extremities. Right leg swelling. Initial encounter. EXAM: CHEST  2 VIEW COMPARISON:  Chest radiograph performed 08/20/2014, and CTA of the chest performed 09/27/2014 FINDINGS: The lungs are well-aerated. Mild peribronchial thickening is noted. There is no evidence of focal  opacification, pleural effusion or pneumothorax. The heart is normal in size; the mediastinal contour is within normal limits. No acute osseous abnormalities are seen. IMPRESSION: Mild peribronchial thickening noted.  Lungs otherwise clear. Electronically Signed   By: Garald Balding M.D.   On: 07/08/2015 01:24   Dg Foot Complete Right  07/08/2015  CLINICAL DATA:  Acute onset of right leg and foot swelling. Initial encounter. EXAM: RIGHT FOOT COMPLETE - 3+ VIEW COMPARISON:  Right foot radiographs performed 10/07/2014 FINDINGS: There is no evidence of fracture or dislocation. The joint spaces are preserved. There is no evidence of talar subluxation; the subtalar joint is unremarkable in appearance. Diffuse soft tissue swelling is noted about the forefoot, more prominent dorsally. IMPRESSION: 1. No evidence of fracture or dislocation. 2. Diffuse soft tissue swelling about the forefoot, more prominent dorsally. Electronically Signed   By: Garald Balding M.D.   On: 07/08/2015 01:23     Imaging Review No results found. I have personally reviewed and evaluated these images and lab results as part of my medical decision-making.   EKG Interpretation None      MDM   Final diagnoses:  None    1. Back pain 2. Chest wall pain 3. Toe abrasions  The patient presents with multiple complaints: CXR negative, chest wall reproducibly tender. Suspect right sided chest pain is musculoskeletal in nature.   Low back is mildly tender, pain worse with movement, history of the same. Suspect muscular back pain.  Foot sores, likely from wearing his shoes without socks and "walking a lot". He is on appropriate antibiotics for skin infections and reports he is taking them. Doubt acute infection to feet while on these antibiotics. Wound care provided.   At discharge, the patient asks about nail care while in the ED. He was encouraged to follow up with a podiatrist for nail care of overgrown and thickened nails. He  was also complaining of a rash that he was concerned was related to current medications. On exam, there  is a rash consisting of 4 small, raised, non-blistering, non-pustular bumps on his back. He was reassured that these were not drug related.     Charlann Lange, PA-C 07/08/15 0425  Varney Biles, MD 07/09/15 [phone]

## 2015-07-08 NOTE — ED Notes (Signed)
Bilateral foot soaked in Hibiclens solution .

## 2015-07-29 ENCOUNTER — Encounter (HOSPITAL_COMMUNITY): Payer: Self-pay | Admitting: Emergency Medicine

## 2015-07-29 ENCOUNTER — Emergency Department (HOSPITAL_COMMUNITY)
Admission: EM | Admit: 2015-07-29 | Discharge: 2015-07-29 | Disposition: A | Discharge status: Home / Self Care | Payer: Medicare Other | Attending: Emergency Medicine | Admitting: Emergency Medicine

## 2015-07-29 ENCOUNTER — Emergency Department (HOSPITAL_COMMUNITY): Payer: Medicare Other

## 2015-07-29 DIAGNOSIS — R05 Cough: Secondary | ICD-10-CM | POA: Diagnosis not present

## 2015-07-29 DIAGNOSIS — R200 Anesthesia of skin: Secondary | ICD-10-CM | POA: Diagnosis not present

## 2015-07-29 DIAGNOSIS — Z7984 Long term (current) use of oral hypoglycemic drugs: Secondary | ICD-10-CM | POA: Diagnosis not present

## 2015-07-29 DIAGNOSIS — M5430 Sciatica, unspecified side: Secondary | ICD-10-CM

## 2015-07-29 DIAGNOSIS — R0789 Other chest pain: Secondary | ICD-10-CM

## 2015-07-29 DIAGNOSIS — M5440 Lumbago with sciatica, unspecified side: Secondary | ICD-10-CM | POA: Diagnosis not present

## 2015-07-29 DIAGNOSIS — F17210 Nicotine dependence, cigarettes, uncomplicated: Secondary | ICD-10-CM | POA: Insufficient documentation

## 2015-07-29 DIAGNOSIS — M5410 Radiculopathy, site unspecified: Secondary | ICD-10-CM | POA: Diagnosis not present

## 2015-07-29 DIAGNOSIS — J45909 Unspecified asthma, uncomplicated: Secondary | ICD-10-CM | POA: Diagnosis not present

## 2015-07-29 DIAGNOSIS — E119 Type 2 diabetes mellitus without complications: Secondary | ICD-10-CM | POA: Diagnosis not present

## 2015-07-29 DIAGNOSIS — R079 Chest pain, unspecified: Secondary | ICD-10-CM | POA: Diagnosis not present

## 2015-07-29 DIAGNOSIS — R531 Weakness: Secondary | ICD-10-CM | POA: Diagnosis present

## 2015-07-29 LAB — I-STAT TROPONIN, ED: Troponin i, poc: 0.01 ng/mL (ref 0.00–0.08)

## 2015-07-29 LAB — CBC
HCT: 44.7 % (ref 39.0–52.0)
Hemoglobin: 15.4 g/dL (ref 13.0–17.0)
MCH: 27.5 pg (ref 26.0–34.0)
MCHC: 34.5 g/dL (ref 30.0–36.0)
MCV: 80 fL (ref 78.0–100.0)
Platelets: 157 10*3/uL (ref 150–400)
RBC: 5.59 MIL/uL (ref 4.22–5.81)
RDW: 13.9 % (ref 11.5–15.5)
WBC: 5.3 10*3/uL (ref 4.0–10.5)

## 2015-07-29 LAB — COMPREHENSIVE METABOLIC PANEL
ALT: 17 U/L (ref 17–63)
AST: 17 U/L (ref 15–41)
Albumin: 3.5 g/dL (ref 3.5–5.0)
Alkaline Phosphatase: 66 U/L (ref 38–126)
Anion gap: 7 (ref 5–15)
BUN: 5 mg/dL — ABNORMAL LOW (ref 6–20)
CO2: 27 mmol/L (ref 22–32)
Calcium: 9.3 mg/dL (ref 8.9–10.3)
Chloride: 107 mmol/L (ref 101–111)
Creatinine, Ser: 1.09 mg/dL (ref 0.61–1.24)
GFR calc Af Amer: >60 mL/min (ref >60)
GFR calc non Af Amer: >60 mL/min (ref >60)
Glucose, Bld: 117 mg/dL — ABNORMAL HIGH (ref 65–99)
Potassium: 4 mmol/L (ref 3.5–5.1)
Sodium: 141 mmol/L (ref 135–145)
Total Bilirubin: 1.1 mg/dL (ref 0.3–1.2)
Total Protein: 6.1 g/dL — ABNORMAL LOW (ref 6.5–8.1)

## 2015-07-29 LAB — DIFFERENTIAL
Basophils Absolute: 0 10*3/uL (ref 0.0–0.1)
Basophils Relative: 0 %
Eosinophils Absolute: 0.1 10*3/uL (ref 0.0–0.7)
Eosinophils Relative: 1 %
Lymphocytes Relative: 49 %
Lymphs Abs: 2.6 10*3/uL (ref 0.7–4.0)
Monocytes Absolute: 0.4 10*3/uL (ref 0.1–1.0)
Monocytes Relative: 7 %
Neutro Abs: 2.3 10*3/uL (ref 1.7–7.7)
Neutrophils Relative %: 43 %

## 2015-07-29 LAB — D-DIMER, QUANTITATIVE: D-Dimer, Quant: <0.27 ug/mL-FEU (ref 0.00–0.50)

## 2015-07-29 LAB — I-STAT CHEM 8, ED
BUN: 5 mg/dL — ABNORMAL LOW (ref 6–20)
Calcium, Ion: 1.2 mmol/L (ref 1.13–1.30)
Chloride: 104 mmol/L (ref 101–111)
Creatinine, Ser: 1 mg/dL (ref 0.61–1.24)
Glucose, Bld: 112 mg/dL — ABNORMAL HIGH (ref 65–99)
HCT: 45 % (ref 39.0–52.0)
Hemoglobin: 15.3 g/dL (ref 13.0–17.0)
Potassium: 3.9 mmol/L (ref 3.5–5.1)
Sodium: 142 mmol/L (ref 135–145)
TCO2: 28 mmol/L (ref 0–100)

## 2015-07-29 LAB — APTT: aPTT: 29 seconds (ref 24–37)

## 2015-07-29 LAB — PROTIME-INR
INR: 1.01 (ref 0.00–1.49)
Prothrombin Time: 13.5 seconds (ref 11.6–15.2)

## 2015-07-29 MED ORDER — OXYCODONE-ACETAMINOPHEN 5-325 MG PO TABS
1.0000 | ORAL_TABLET | Freq: Once | ORAL | Status: AC
  Administered 2015-07-29: 1 via ORAL
  Filled 2015-07-29: qty 1

## 2015-07-29 MED ORDER — METHOCARBAMOL 500 MG PO TABS: 500.0000 mg | ORAL_TABLET | Freq: Two times a day (BID) | ORAL | Status: DC

## 2015-07-29 NOTE — ED Notes (Signed)
Pt reports having mid back pain for the last 2 weeks that has gotten worse over the last 2 days and last night started going in to his chest. Pt states this morning at 6am he noticed weakness in his right hand and was feeling "numbness" in his right hand. Pt also reports difficulty with walking for 2 days. Pt states he started having a numbness in right leg last night, right hand numbness started at 6am today. Pt has drift in right arm and bilaterally drift in left and right leg. Pt also has decreased sensation to right arm, leg and face. Pt is alert and ox4, speech is clear.

## 2015-07-29 NOTE — ED Provider Notes (Signed)
CSN: [id number]     Arrival date & time 07/29/15  1317 History   First MD Initiated Contact with Patient 07/29/15 1713     Chief Complaint  Patient presents with  . Weakness  . Back Pain     (Consider location/radiation/quality/duration/timing/severity/associated sxs/prior Treatment) HPI  Pt presenting with c/o ongoing low back pain, with radiation down to both legs as well as pain under right shoulder blade and in right shoulder radiating into his chest.  He states this pain has been ongoing for several weeks, but this morning pain increased with radiation to right arm as well as numbness and tingling of right arm.  No neck pain.  No fever.  No changes in vision or speech.  He states he has taken naproxen as prescribed at last visit to the ED but it is not helping.  He also states he took a friend's flexeril but it did not help either.  No weakness of legs.  No urinary retention, no incontinence of bowel or bladder.  Pain is worse with movement of right arm and twisting movements.  Pt states he cannot see his PMD until early August and would like something to help with the pain.  There are no other associated systemic symptoms, there are no other alleviating or modifying factors.  Per triage note there is noted numbness of right face but patient does not endorse this to me.   Past Medical History  Diagnosis Date  . Diabetes mellitus without complication ([place])   . Anginal pain ([place])   . Heart murmur     born with heart murmur  . Dysrhythmia     irregular heartbeat due to stress  . Asthma   . Pneumonia   . Depression   . Anxiety   . GERD (gastroesophageal reflux disease)    Past Surgical History  Procedure Laterality Date  . Tonsillectomy    . Cholecystectomy N/A 09/16/2014    Procedure: LAPAROSCOPIC CHOLECYSTECTOMY ;  Surgeon: Arta Bruce Kinsinger, MD;  Location: [facility];  Service: General;  Laterality: N/A;  . Testicle surgery      growth removed  . Eye surgery     Family History   Problem Relation Age of Onset  . Hypertension Mother   . Heart disease Father   . Cancer Father    Social History  Substance Use Topics  . Smoking status: Current Every Day Smoker -- 0.50 packs/day    Types: Cigarettes  . Smokeless tobacco: Never Used  . Alcohol Use: No    Review of Systems  ROS reviewed and all otherwise negative except for mentioned in HPI    Allergies  Review of patient's allergies indicates no known allergies.  Home Medications   Prior to Admission medications   Medication Sig Start Date End Date Taking? Authorizing Provider  naproxen (NAPROSYN) 375 MG tablet Take 375 mg by mouth 2 (two) times daily with a meal.   Yes Historical Provider, MD  canagliflozin (INVOKANA) 100 MG TABS tablet Take 1 tablet (100 mg total) by mouth daily. Patient not taking: Reported on 07/08/2015 08/29/14   Tresa Garter, MD  cephALEXin (KEFLEX) 500 MG capsule Take 1 capsule (500 mg total) by mouth 4 (four) times daily. Patient not taking: Reported on 07/29/2015 07/02/15   April Palumbo, MD  doxycycline (VIBRAMYCIN) 100 MG capsule Take 1 capsule (100 mg total) by mouth 2 (two) times daily. One po bid x 7 days Patient not taking: Reported on 07/29/2015 07/02/15   April  Palumbo, MD  glipiZIDE (GLUCOTROL XL) 5 MG 24 hr tablet Take 1 tablet (5 mg total) by mouth daily with breakfast. Patient not taking: Reported on 07/08/2015 08/02/14   Tresa Garter, MD  HYDROcodone-acetaminophen (NORCO/VICODIN) 5-325 MG per tablet Take 1 tablet by mouth every 6 (six) hours as needed for severe pain. Patient not taking: Reported on 07/02/2015 10/07/14   Junius Creamer, NP  ibuprofen (ADVIL,MOTRIN) 600 MG tablet Take 1 tablet (600 mg total) by mouth every 6 (six) hours as needed for moderate pain. Patient not taking: Reported on 07/02/2015 10/07/14   Junius Creamer, NP  metFORMIN (GLUCOPHAGE) 1000 MG tablet Take 1 tablet (1,000 mg total) by mouth 2 (two) times daily with a meal. Patient not taking:  Reported on 07/02/2015 09/30/14   Debbe Odea, MD  methocarbamol (ROBAXIN) 500 MG tablet Take 1 tablet (500 mg total) by mouth 2 (two) times daily. 07/29/15   Alfonzo Beers, MD  naproxen (NAPROSYN) 375 MG tablet Take 1 tablet (375 mg total) by mouth 2 (two) times daily. Patient not taking: Reported on 07/08/2015 07/02/15   April Palumbo, MD  oxyCODONE-acetaminophen (PERCOCET/ROXICET) 5-325 MG tablet Take 1 tablet by mouth every 4 (four) hours as needed for severe pain. Patient not taking: Reported on 07/29/2015 07/08/15   Charlann Lange, PA-C   BP 142/98 mmHg  Pulse 60  Temp(Src) 98.8 F (37.1 C) (Oral)  Resp 12  SpO2 100%  Vitals reviewed Physical Exam  Physical Examination: General appearance - alert, well appearing, and in no distress Mental status - alert, oriented to person, place, and time Eyes - pupils equal and reactive, extraocular eye movements intact Mouth - mucous membranes moist, pharynx normal without lesions Chest - clear to auscultation, no wheezes, rales or rhonchi, symmetric air entry Heart - normal rate, regular rhythm, normal S1, S2, no murmurs, rubs, clicks or gallops Abdomen - soft, nontender, nondistended, no masses or organomegaly Neurological - alert, oriented, normal speech, cranial nerves 2-12 tested and intact, strength 5/5 in extremities x 4, subjective decreased sensation to light touch in right upper extremity, normal in bilateral lower extremities Extremities - peripheral pulses normal, no pedal edema, no clubbing or cyanosis Skin - normal coloration and turgor, no rashes  ED Course  Procedures (including critical care time) Labs Review Labs Reviewed  COMPREHENSIVE METABOLIC PANEL - Abnormal; Notable for the following:    Glucose, Bld 117 (*)    BUN 5 (*)    Total Protein 6.1 (*)    All other components within normal limits  I-STAT CHEM 8, ED - Abnormal; Notable for the following:    BUN 5 (*)    Glucose, Bld 112 (*)    All other components within normal  limits  PROTIME-INR  APTT  CBC  DIFFERENTIAL  D-DIMER, QUANTITATIVE (NOT AT [facility])  Randolm Idol, ED    Imaging Review Dg Chest 2 View  07/29/2015  CLINICAL DATA:  Cough, left side chest pain EXAM: CHEST  2 VIEW COMPARISON:  07/08/2015 FINDINGS: Borderline cardiomegaly. No acute infiltrate or pleural effusion. No pulmonary edema. Degenerative changes mid and lower thoracic spine. IMPRESSION: No active disease.  Borderline cardiomegaly. Electronically Signed   By: Lahoma Crocker M.D.   On: 07/29/2015 18:19   Ct Head Wo Contrast  07/29/2015  CLINICAL DATA:  Right upper extremity numbness for 1 day EXAM: CT HEAD WITHOUT CONTRAST TECHNIQUE: Contiguous axial images were obtained from the base of the skull through the vertex without intravenous contrast. COMPARISON:  September 27, 2014  FINDINGS: Brain: The ventricles are normal in size and configuration. There is no intracranial mass, hemorrhage, extra-axial fluid collection, or midline shift. There is minimal small vessel disease in the centra semiovale bilaterally. Elsewhere, gray-white compartments appear normal. No acute infarct evident. Vascular: There is no vascular calcification or hyperdense vessel. Skull: The bony calvarium appears intact. Sinuses/Orbits: There is opacification of the visualized right maxillary antrum with mild expansion medially. There is opacification of an anterior right ethmoid air cell. Other visualized paranasal sinuses are clear. There is chronic increased attenuation in the left globe, likely chronic vitreous hemorrhage. Small foci of calcification in this area remain as well. The right orbit appears normal. Left globus smaller than right globe, a stable finding. Other:  Mastoid air cells are clear. IMPRESSION: Minimal periventricular small vessel disease. No intracranial mass, hemorrhage, or extra-axial fluid collection. No acute infarct evident. Question mucocele right maxillary antrum. Mild anterior right ethmoid sinus  disease. Chronic apparent vitreous hemorrhage left globe, stable. Electronically Signed   By: Lowella Grip III M.D.   On: 07/29/2015 14:18   I have personally reviewed and evaluated these images and lab results as part of my medical decision-making.     Date/Time:  Tuesday July 29 2015 13:27:42 EDT Ventricular Rate:  78 PR Interval:  136 QRS Duration: 134 QT Interval:  426 QTC Calculation: 485 R Axis:   -60 Text Interpretation:  Normal sinus rhythm Right bundle branch block Left  anterior fascicular block *Bifascicular block * Abnormal ECG No  significant change since last tracing Confirmed by [facility]  MD, Kyandre Okray  [phone]) on 07/29/2015 5:17:59 PM MDM   Final diagnoses:  Chest wall pain  Sciatica, unspecified laterality  Radicular pain    Pt presenting with c/o chest pain- pain is reproducible in chest wall- EKG reassuring without acute changes, troponin negative and patient states the pain has been constant for several days- doubt ACS.  D-dimer obtained and this was negative as well making PE unlikely.  CXR does not show any widened mediastinum- doubt aortic dissection. CXR shows no ptx/pna.  Pt also has some pain under right shoulder blade on right side and posterior shoulder- pain radiates down right arm with some parasthesias- no weakness on neuro exam when patient distracted.  Suspect radicular pain.  Nonfocal head CT and doubt acute stroke as cause of symptoms.  Pt to continue naproxen and will add robaxin to his meds.  Given percocet x 1 with some relief in pain, but d/w patient that we would not be prescribing a narcotic prescription for his current symptoms.  Pt advised to arrange for followup with PMD.  Discharged with strict return precautions.  Pt agreeable with plan.    Alfonzo Beers, MD 07/29/15 2122

## 2015-07-29 NOTE — Discharge Instructions (Signed)
Return to the ED with any concerns including difficulty breathing, fainting, weakness of arms or legs, changes in vision or speech, decreased level of alertness/lethargy, or any other alarming symptoms

## 2015-07-29 NOTE — ED Notes (Signed)
Pt departed in NAD.  

## 2015-08-13 IMAGING — US US ABDOMEN LIMITED
1 series · 14 of 25 positions shown · non-contrast
Comparison: None.

CLINICAL DATA: Right upper quadrant discomfort.

EXAM:
US ABDOMEN LIMITED - RIGHT UPPER QUADRANT

[Series 1: us abdomen limited · 0.25mm/px · 14 of 53 slices shown]
[im 1/53]
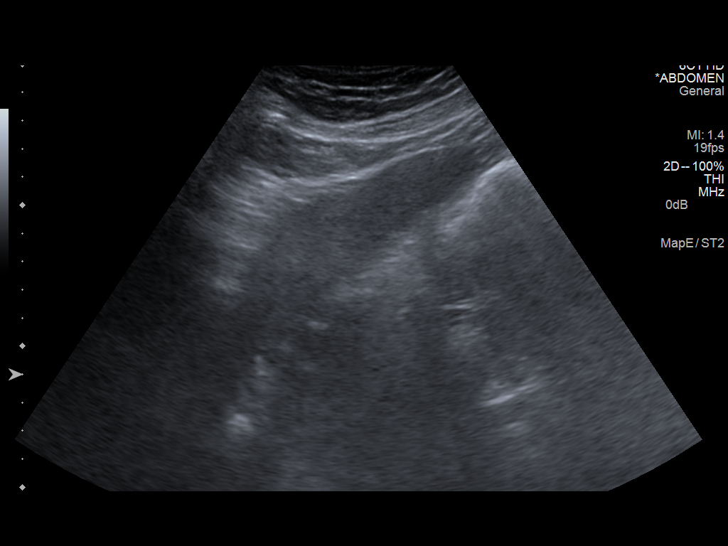
[im 5/53]
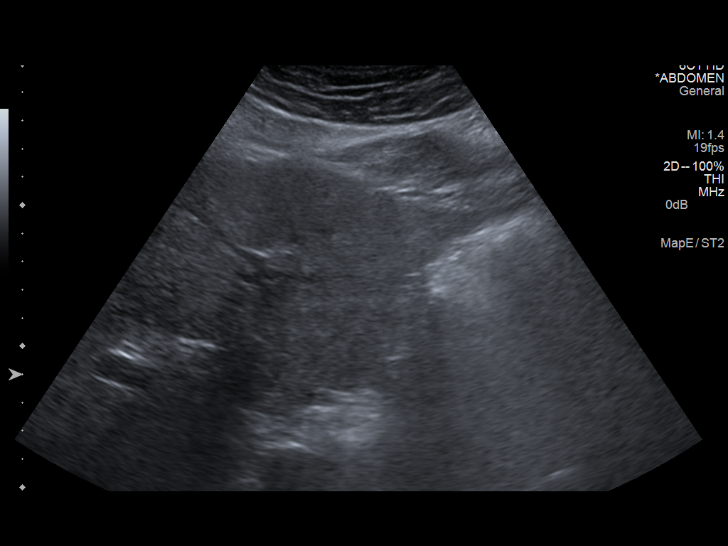
[im 9/53]
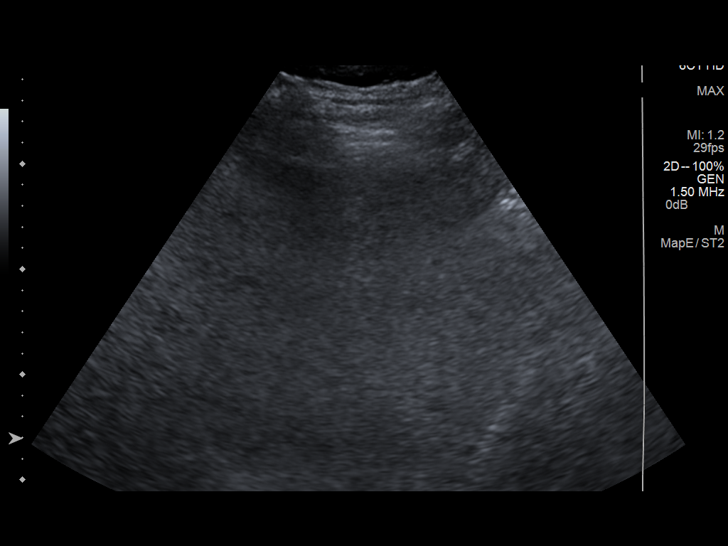
[im 14/53]
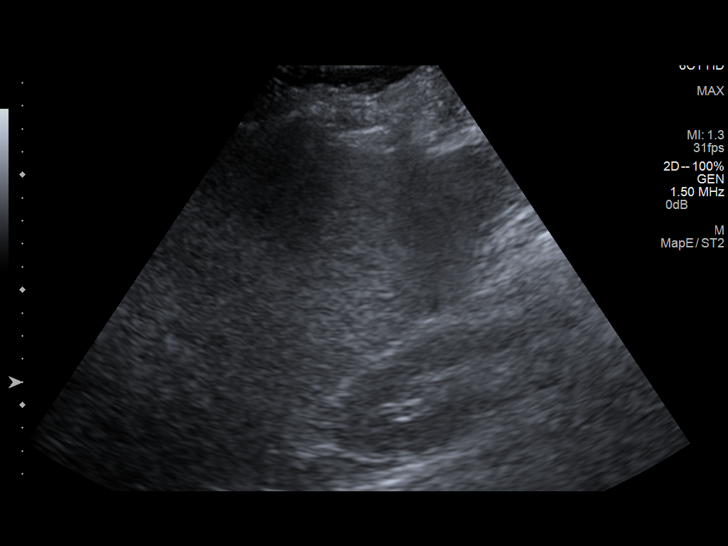
[im 18/53]
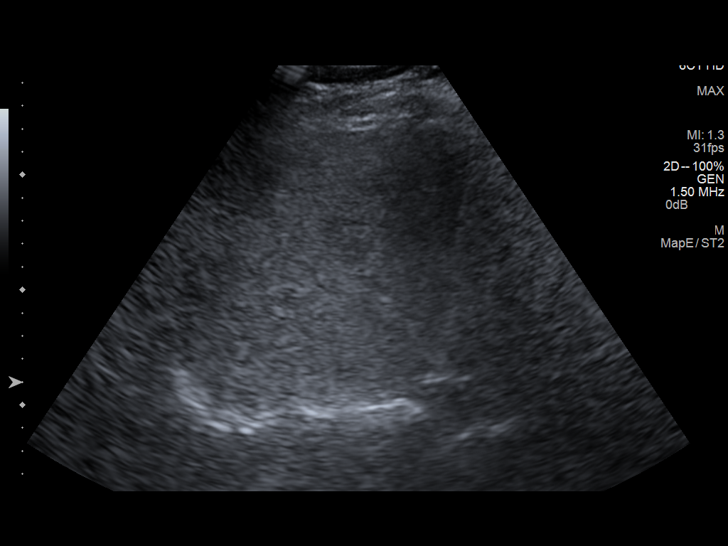
[im 20/53]
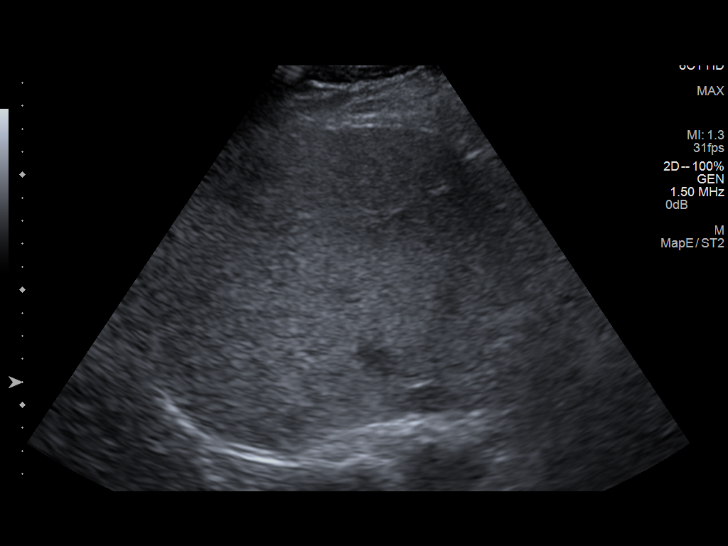
[im 24/53]
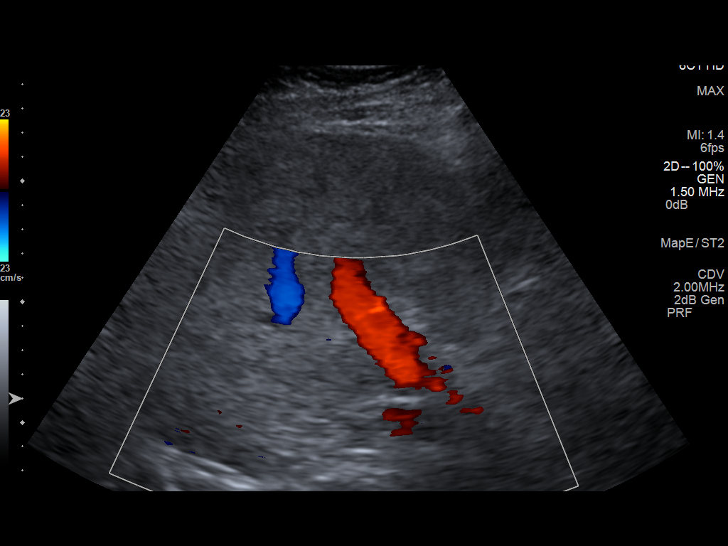
[im 29/53]
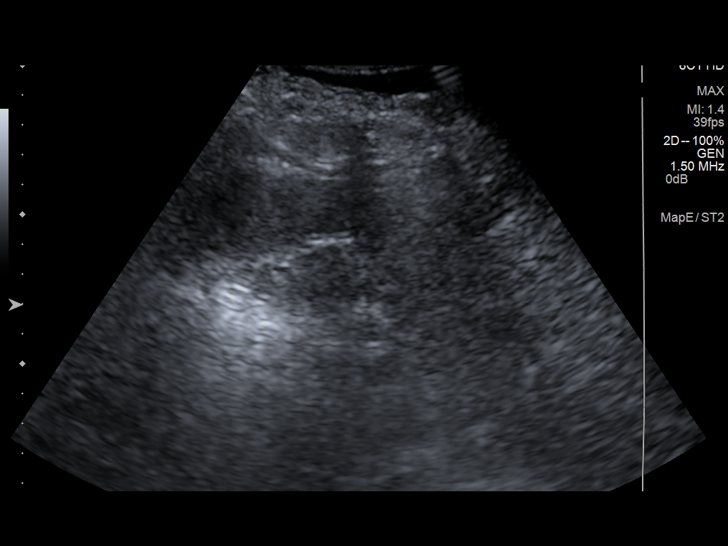
[im 33/53]
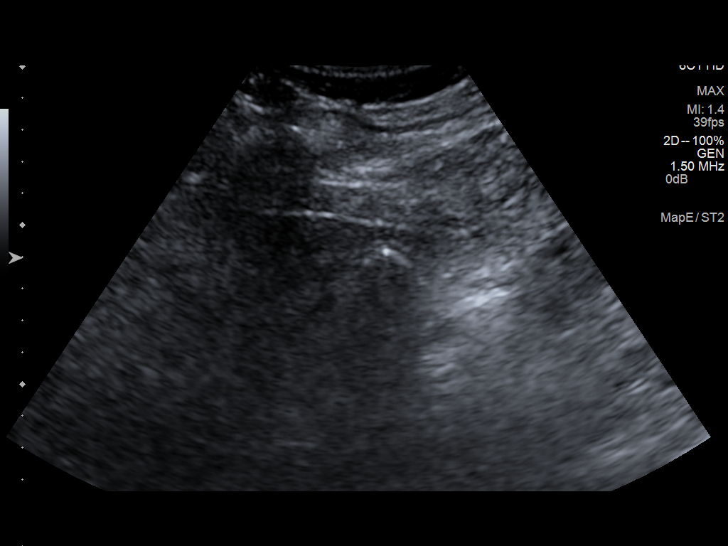
[im 35/53]
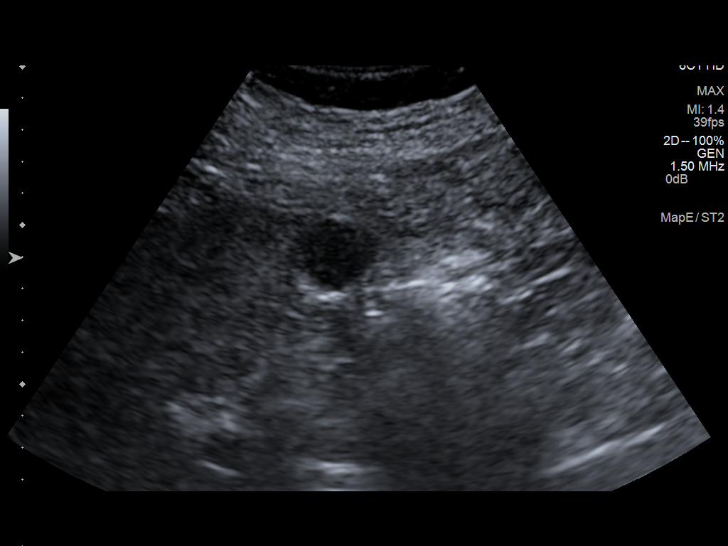
[im 40/53]
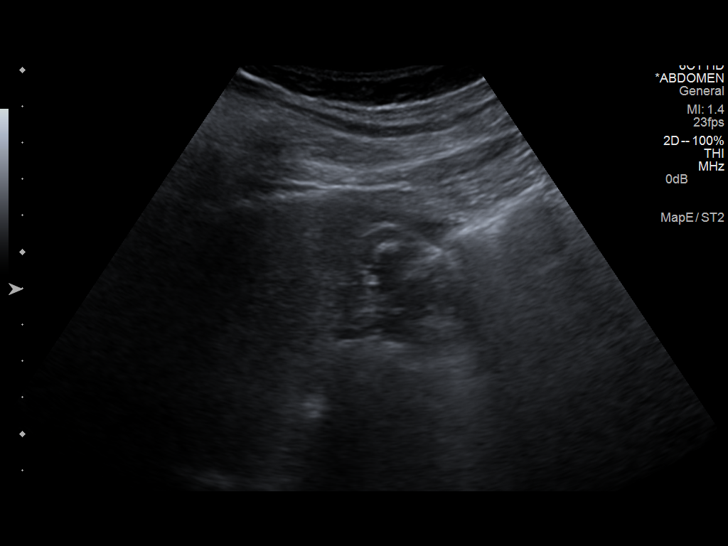
[im 44/53]
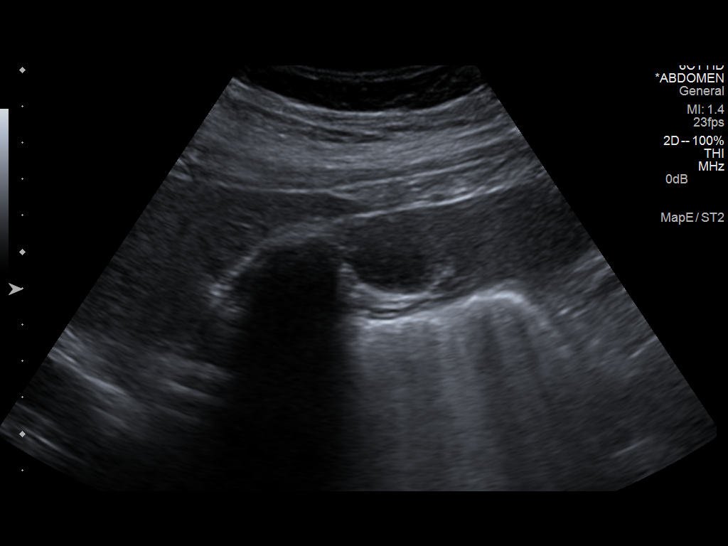
[im 48/53]
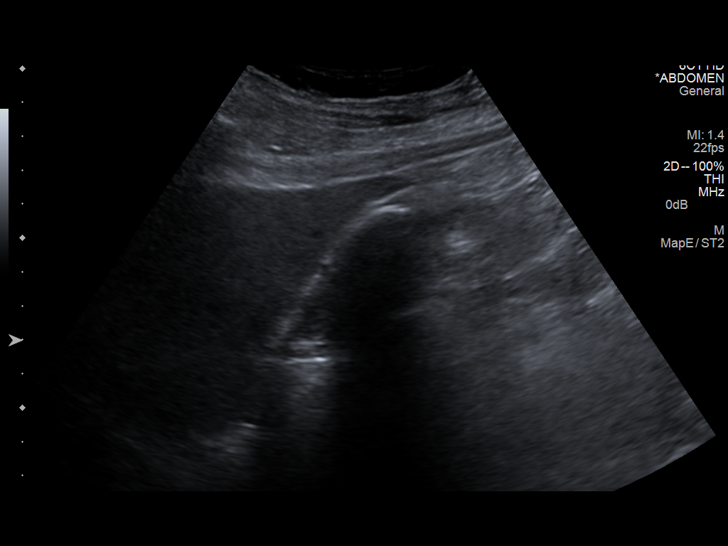
[im 53/53]
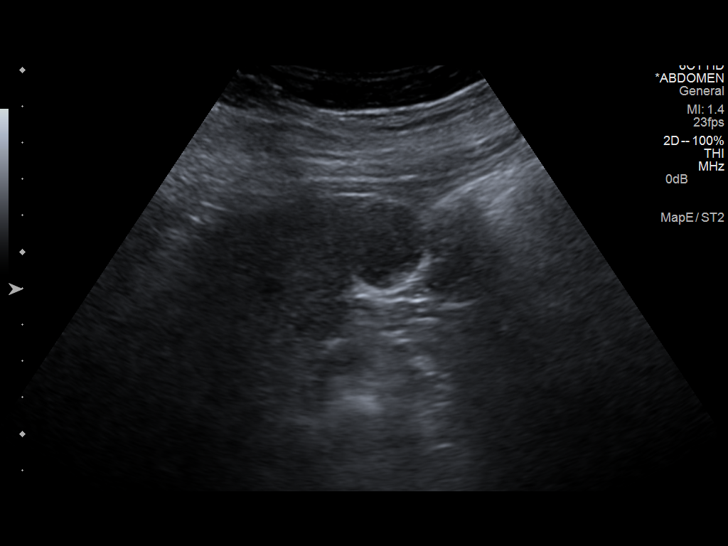

[14 of 25 positions shown; findings below may reference images not displayed]

FINDINGS: Gallbladder

The gallbladder is adequately distended but its lumen is largely
filled with echogenic sludge as well as a stone measuring up to 2 cm
in diameter. There is no gallbladder wall thickening. There is mild
tenderness over the gallbladder but a classic sonographic Murphy's
sign is not demonstrated.

Common bile duct

Diameter: Where visualized the common bile duct measures just under
5 mm in diameter.

Liver:

The liver is demonstrates a homogeneous mildly hyperechoic
parenchymal pattern that likely reflects underlying fatty
infiltration. No focal mass or ductal dilation is demonstrated.
IMPRESSION: 1. There are stones and sludge within the mildly distended
gallbladder. There is mild tenderness over the gallbladder but there
is no classic sonographic Murphy's sign. No gallbladder wall
thickening or pericholecystic fluid is demonstrated.
2. The common bile duct and liver exhibit no acute abnormalities.
There may be fatty infiltrative changes of the liver.

## 2015-08-22 ENCOUNTER — Telehealth: Payer: Self-pay | Admitting: Internal Medicine

## 2015-08-22 ENCOUNTER — Other Ambulatory Visit: Payer: Self-pay | Admitting: Pharmacist

## 2015-08-22 MED ORDER — GLUCOSE BLOOD VI STRP: ORAL_STRIP | 0 refills | Status: DC

## 2015-08-22 NOTE — Telephone Encounter (Signed)
Pt. Called stating he needs the Accu check test strips. Please f/u

## 2015-08-22 NOTE — Telephone Encounter (Signed)
Patient is needing test strips. Please follow up.

## 2015-08-22 NOTE — Telephone Encounter (Signed)
I sent in the strips x 1 box but patient needs office visit for further refills

## 2015-08-27 ENCOUNTER — Telehealth: Payer: Self-pay | Admitting: Family Medicine

## 2015-08-27 ENCOUNTER — Telehealth: Payer: Self-pay | Admitting: Internal Medicine

## 2015-08-27 MED ORDER — GLUCOSE BLOOD VI STRP: ORAL_STRIP | 0 refills | Status: DC

## 2015-08-27 NOTE — Telephone Encounter (Signed)
error 

## 2015-08-27 NOTE — Telephone Encounter (Signed)
Pt. Called stating that he was giving a Rx for test strips and he states that the Rx is only for him to check his DM once a day and he checks it 3 or 4 times a day. Please f/u

## 2015-08-27 NOTE — Telephone Encounter (Signed)
Medicare only covers once daily testing if you are not on insulin. I have resent the script to see if he has different coverage that will allow for multiple daily testings.

## 2015-09-27 ENCOUNTER — Other Ambulatory Visit: Payer: Self-pay | Admitting: Internal Medicine

## 2015-10-08 IMAGING — US US ABDOMEN COMPLETE
1 series · 13 of 25 positions shown · non-contrast
Comparison: January 10, 2013

CLINICAL DATA: Upper abdominal pain

EXAM:
ULTRASOUND ABDOMEN COMPLETE

[Series 1: us abdomen complete · 0.27mm/px · 13 of 87 slices shown]
[im 1/87]
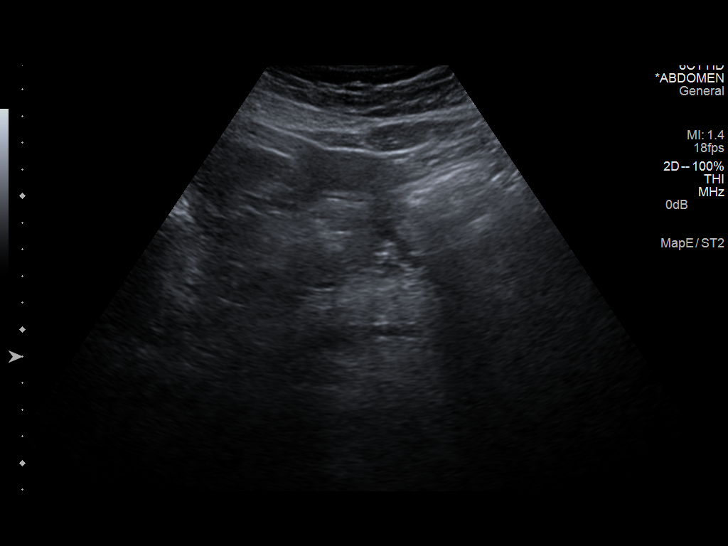
[im 8/87]
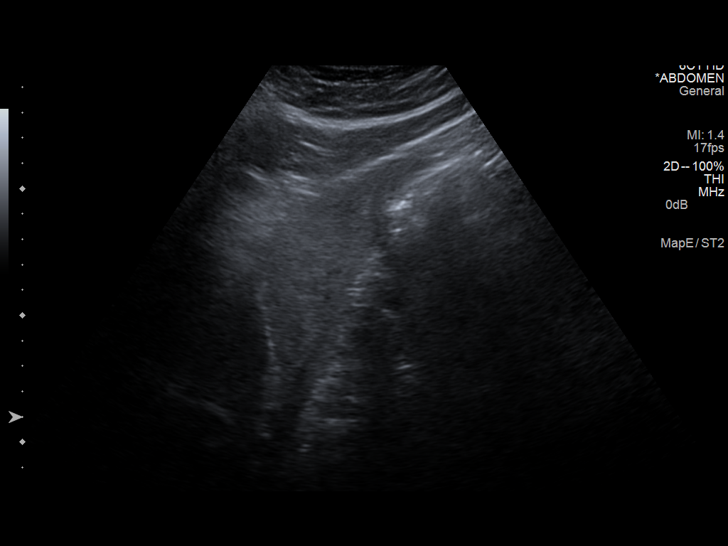
[im 15/87]
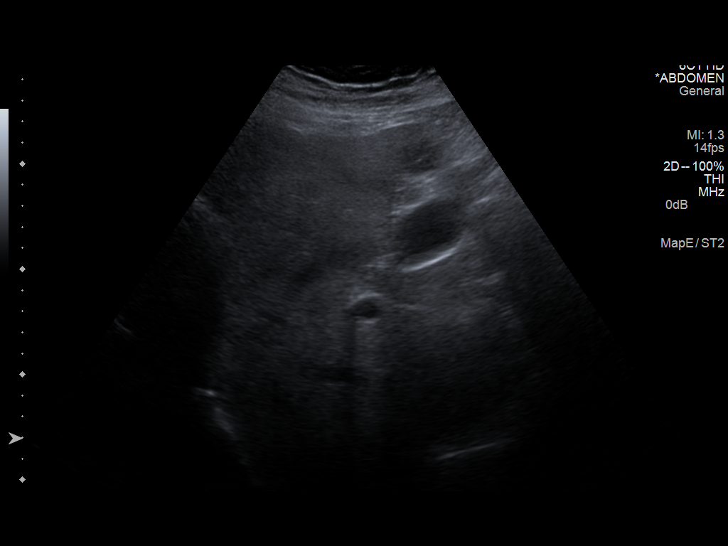
[im 22/87]
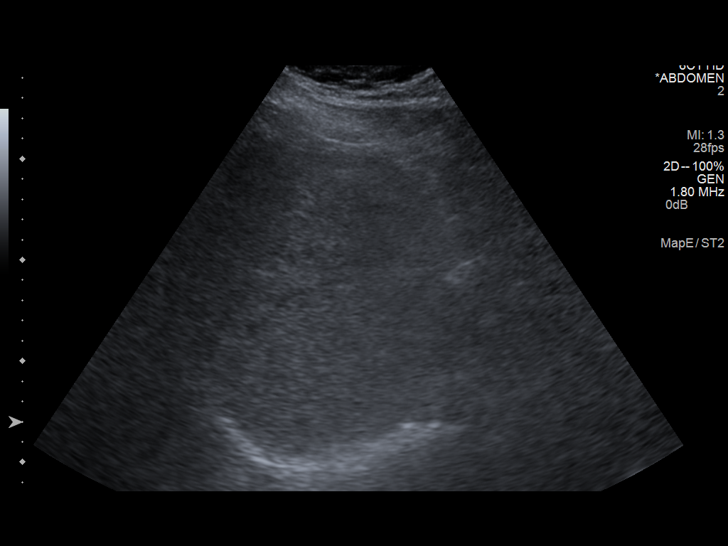
[im 29/87]
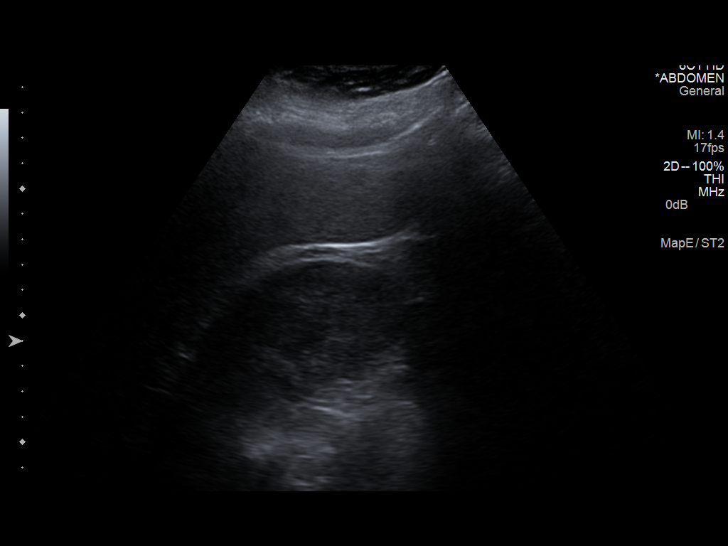
[im 36/87]
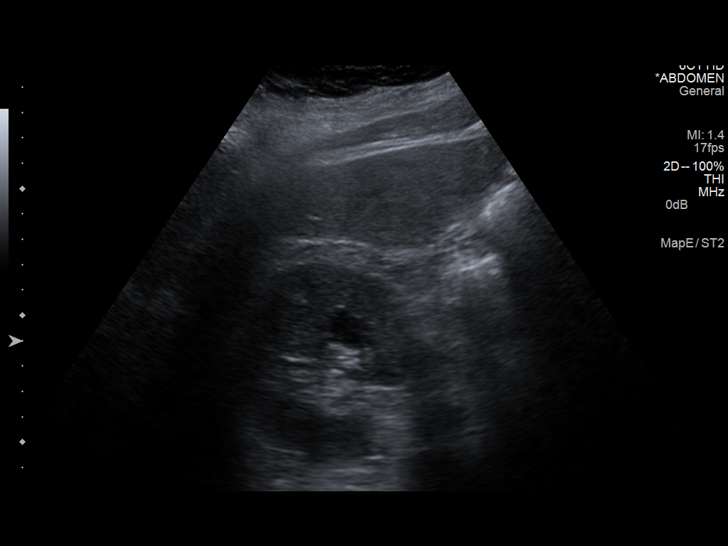
[im 44/87]
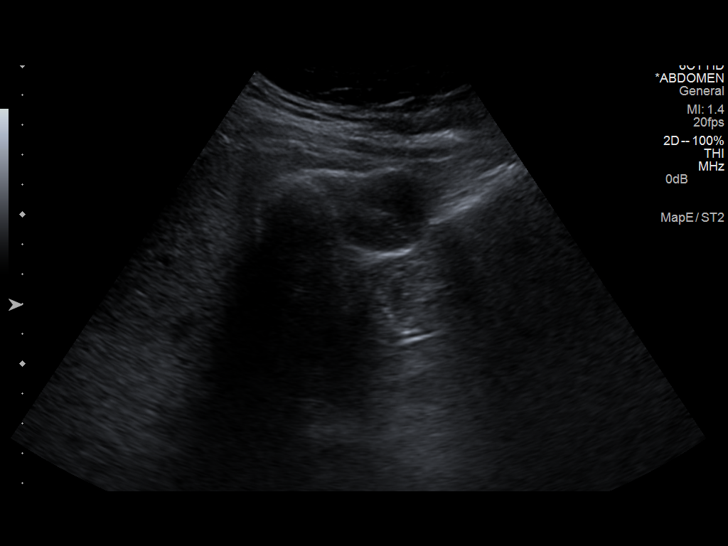
[im 51/87]
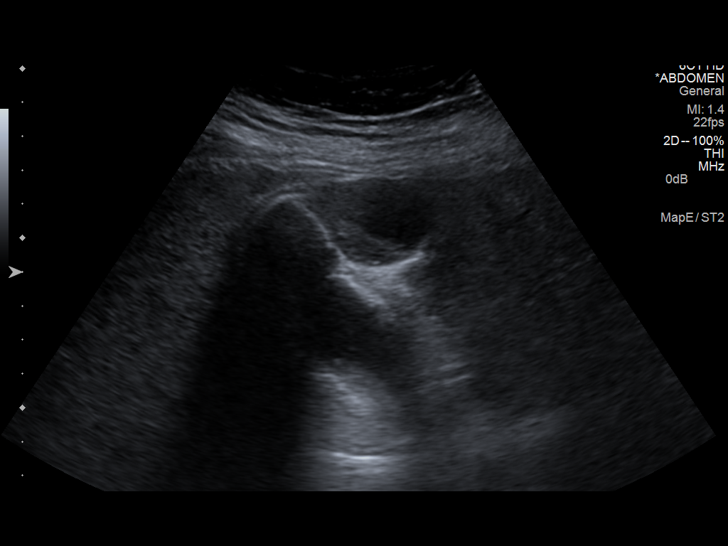
[im 58/87]
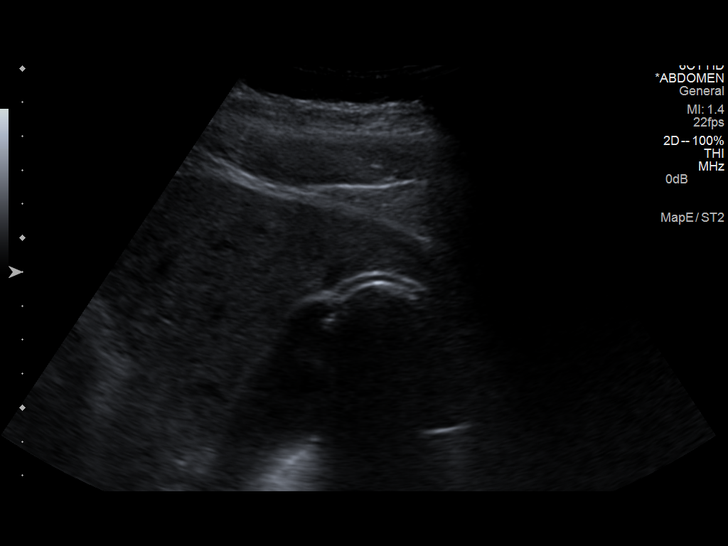
[im 65/87]
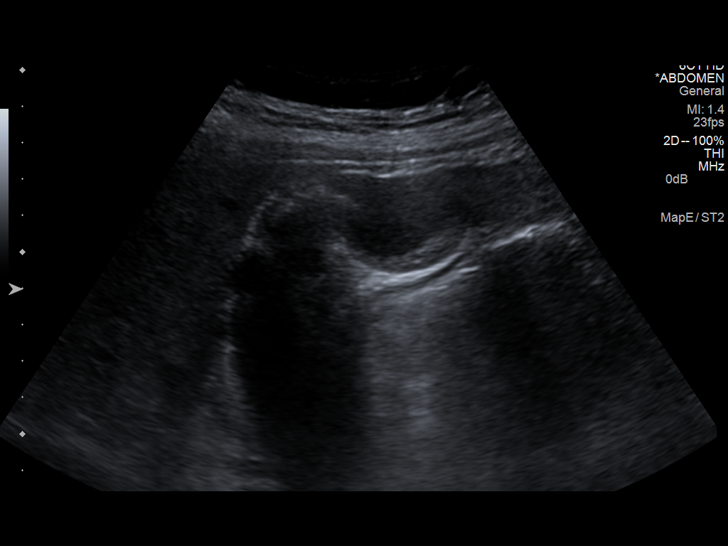
[im 72/87]
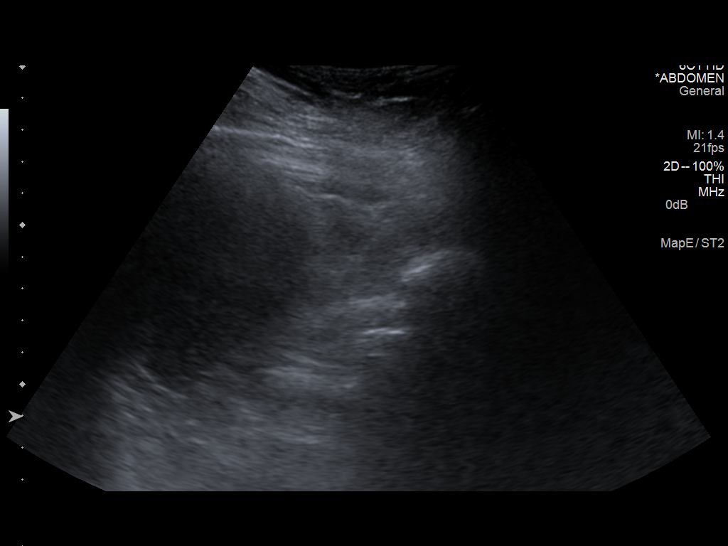
[im 79/87]
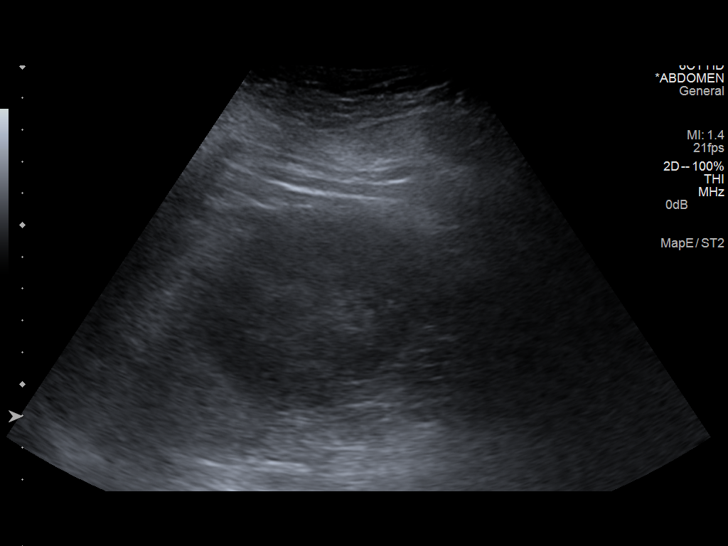
[im 87/87]
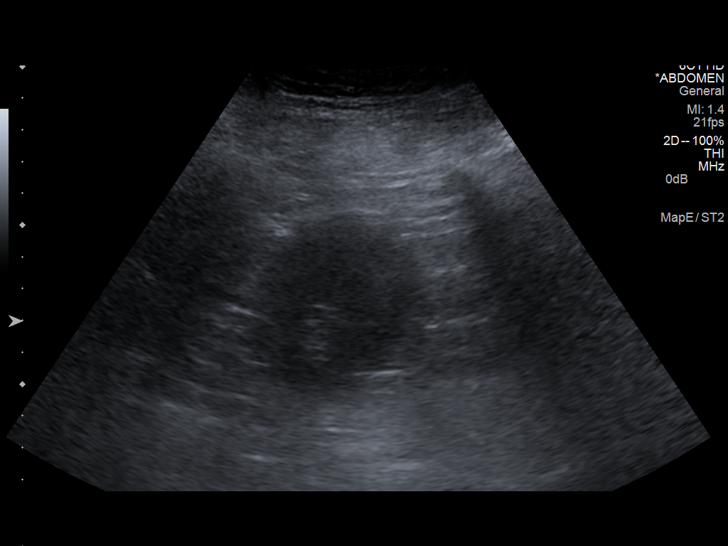

[13 of 25 positions shown; findings below may reference images not displayed]

FINDINGS: Gallbladder:

Within the gallbladder, there is a 2.9 cm echogenic focus which
moves and shadows consistent with either one large or multiple
conglomerated gallstones. There is also sludge in the gallbladder.
The gallbladder wall is not thickened, and there is no
pericholecystic fluid. Patient is mildly tender over the
gallbladder.

Common bile duct:

Diameter: 6 mm. There is no intrahepatic, common hepatic, or common
bile duct dilatation.

Liver:

No focal lesion identified. The echotexture of the liver is
increased.

IVC:

No abnormality visualized.

Pancreas:

Visualized portion unremarkable. Portions of the pancreatic tail are
obscured by gas.

Spleen:

Size and appearance within normal limits.

Right Kidney:

Length: 12.2 cm. Echogenicity within normal limits. No
hydronephrosis visualized. There is a simple cyst measuring 1.6 x
1.7 x 1.7 cm in the mid right kidney. No other renal masses are
identified.

Left Kidney:

Length: 11.9 cm. Echogenicity within normal limits. No mass or
hydronephrosis visualized.

Abdominal aorta:

No aneurysm visualized.

Other findings:

No demonstrable ascites.
IMPRESSION: Cholelithiasis and sludge in gallbladder. Gallbladder wall is not
thickened, and there is no pericholecystic fluid.

There is diffuse increased liver echogenicity consistent with fatty
change. While no focal liver lesions are identified, it must be
cautioned that the sensitivity of ultrasound for focal liver lesions
is diminished given this degree of underlying fatty change.

Portions of the pancreatic tail are obscured by gas. Visualized
portions of pancreas appear normal.

There is a simple cyst in the right kidney.

## 2015-10-20 IMAGING — US US ABDOMEN COMPLETE
1 series · 14 of 25 positions shown · non-contrast
Comparison: 03/07/2013

CLINICAL DATA: Right upper quadrant abdominal pain for 3 days

EXAM:
ULTRASOUND ABDOMEN COMPLETE

[Series 1: us abdomen complete · 0.23mm/px · 14 of 122 slices shown]
[im 1/122]
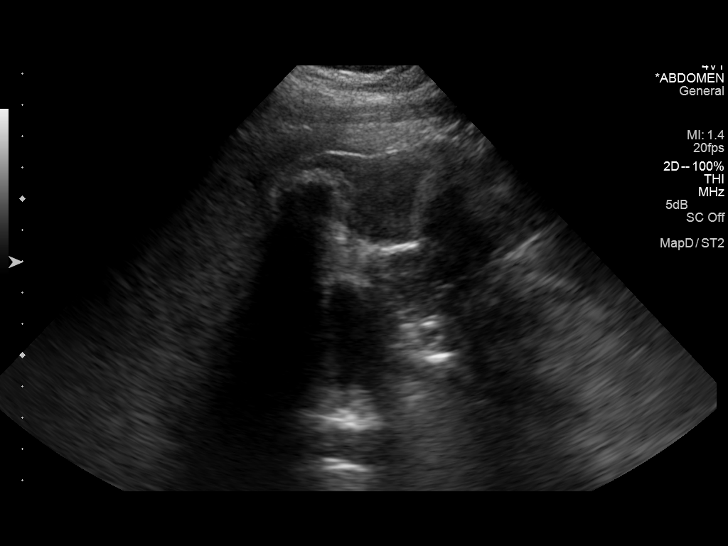
[im 11/122]
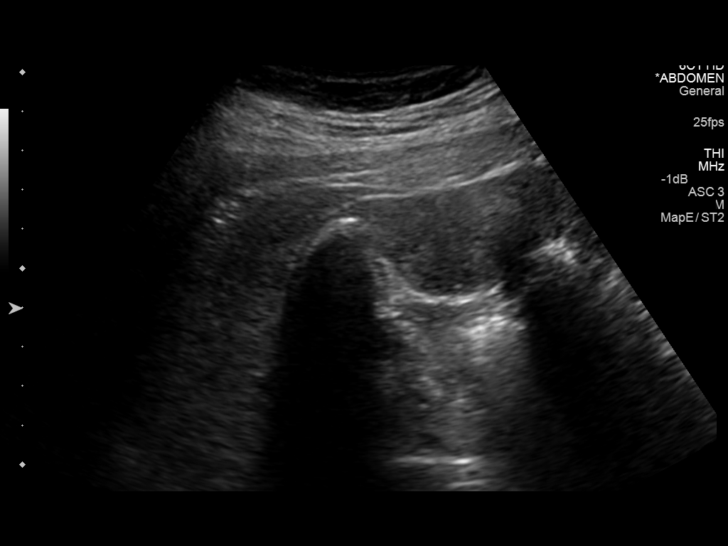
[im 21/122]
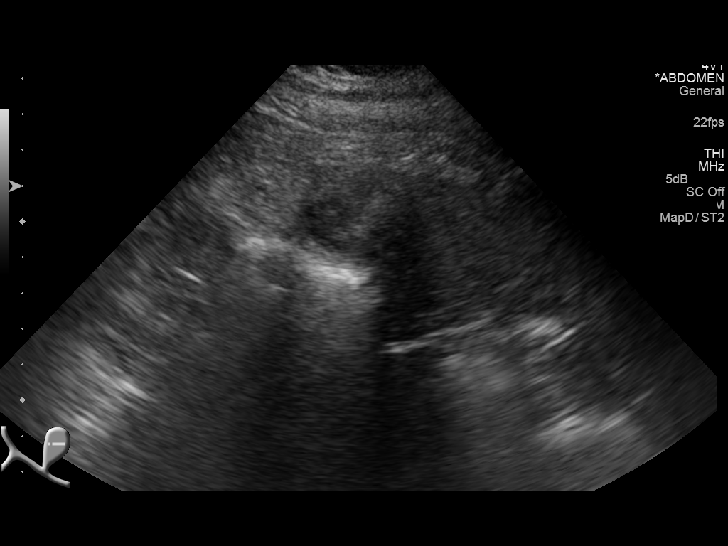
[im 31/122]
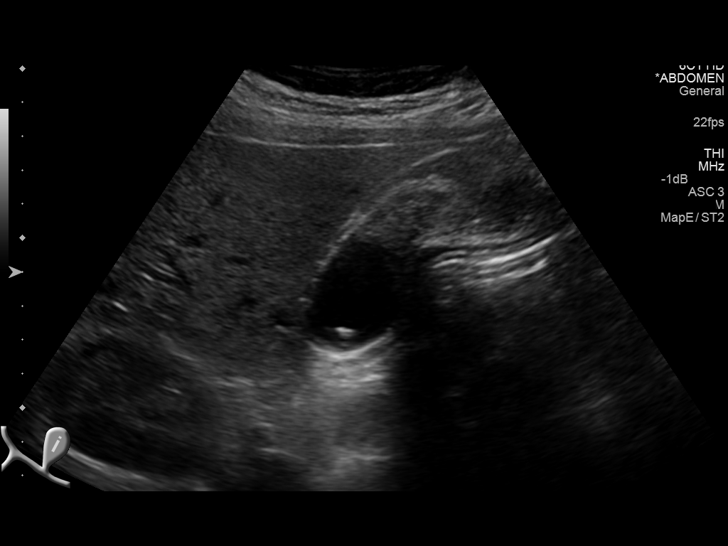
[im 41/122]
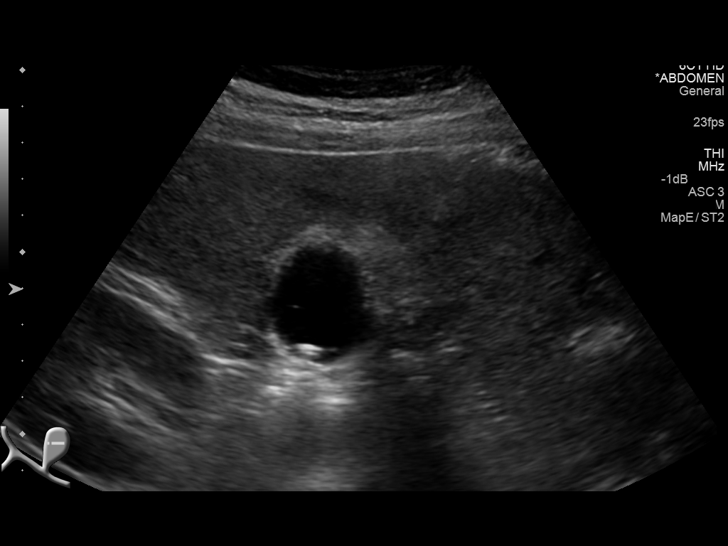
[im 46/122]
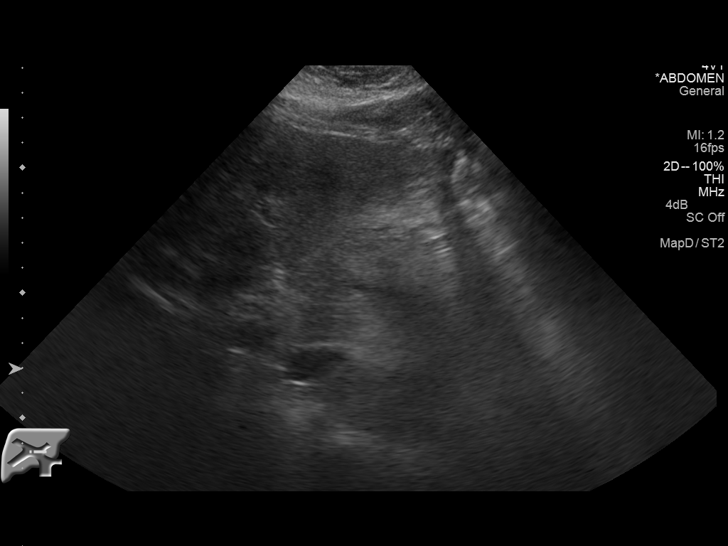
[im 56/122]
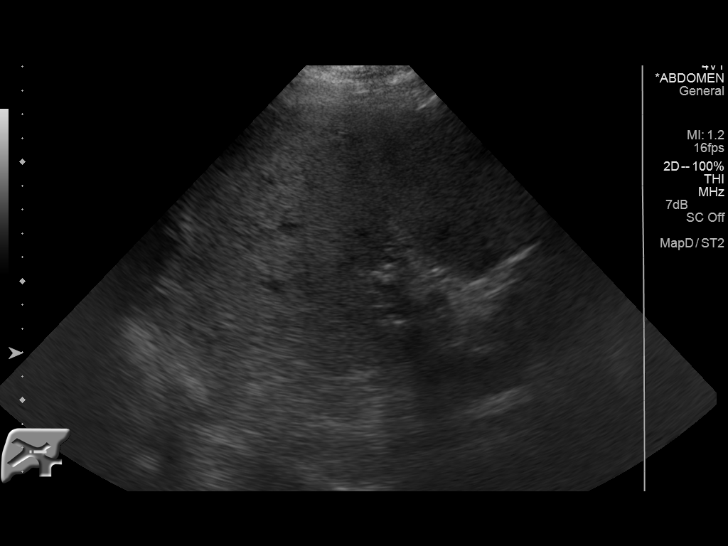
[im 66/122]
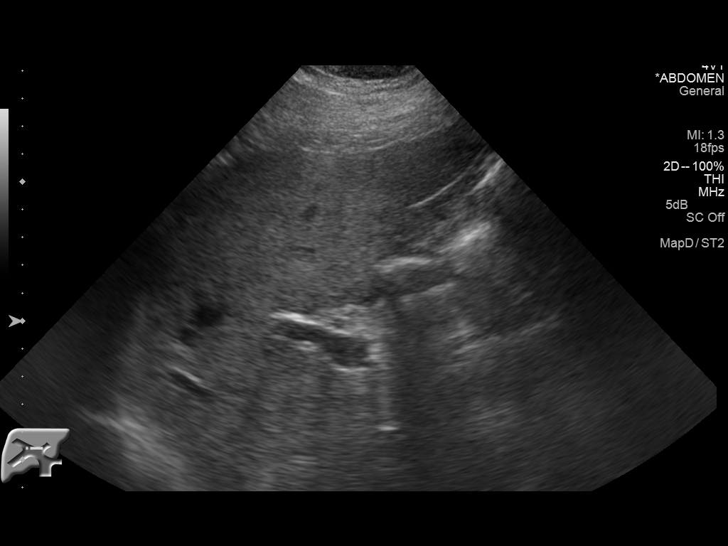
[im 76/122]
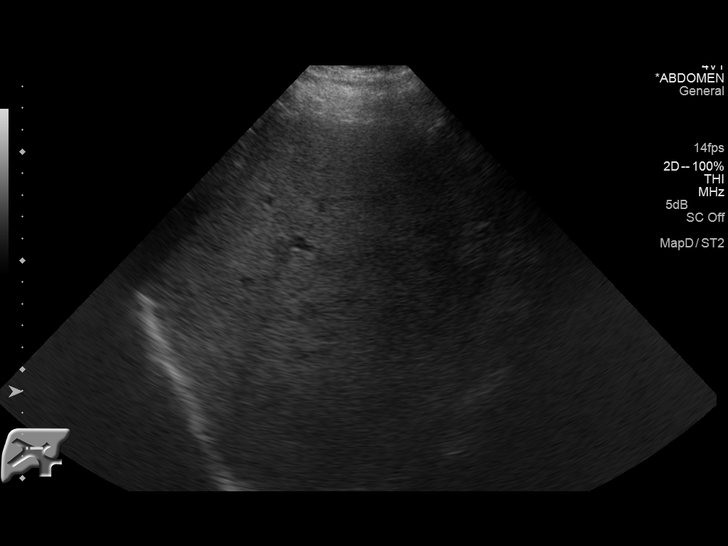
[im 81/122]
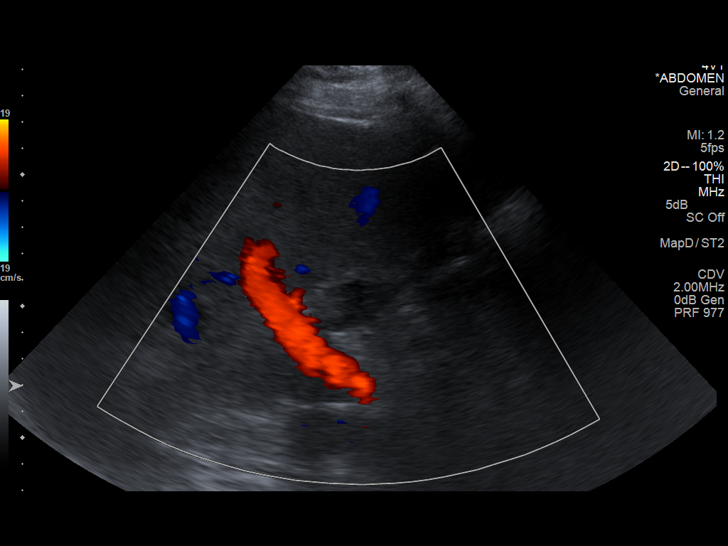
[im 91/122]
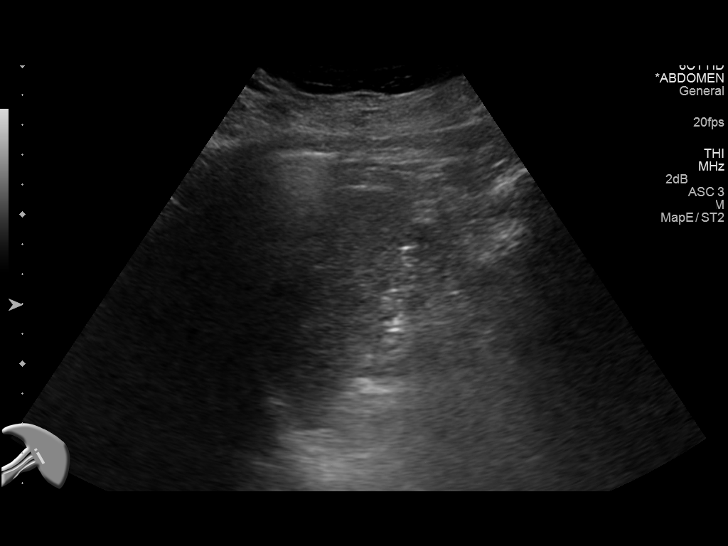
[im 101/122]
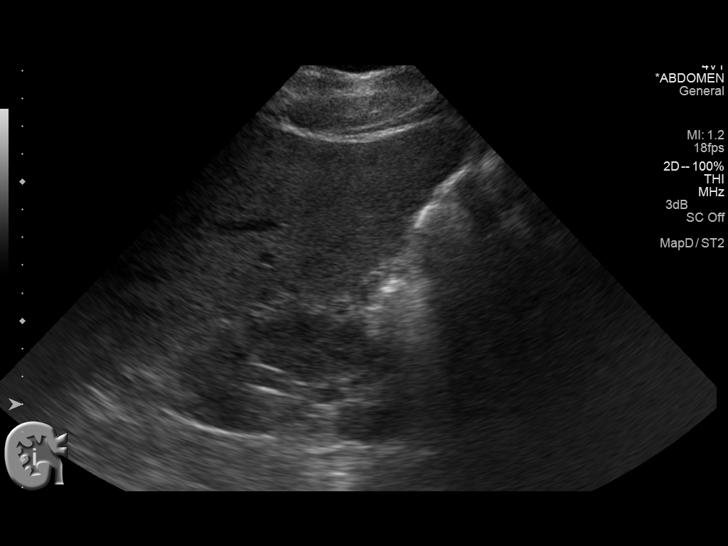
[im 111/122]
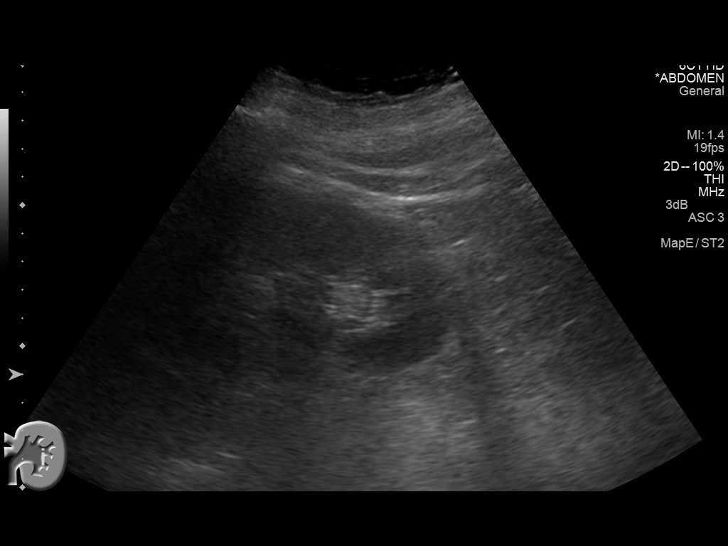
[im 122/122]
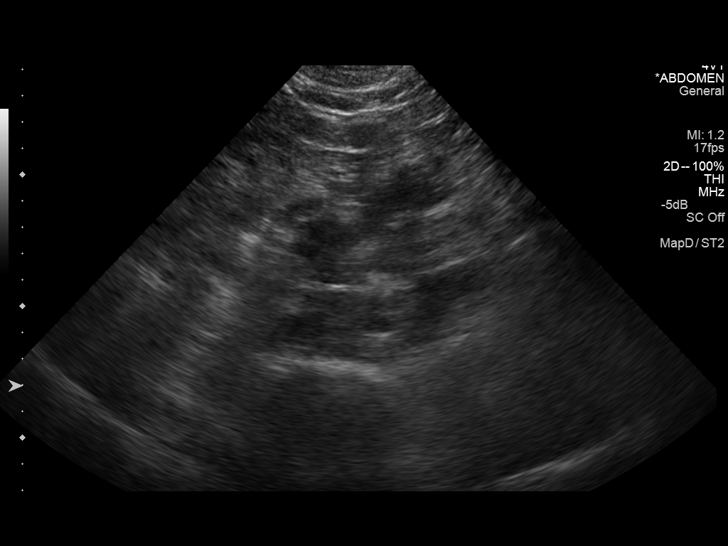

[14 of 25 positions shown; findings below may reference images not displayed]

FINDINGS: Gallbladder: Multiple gallstones reidentified, largest 2.9 cm,
without gallbladder wall thickening, pericholecystic fluid, or
sonographic Murphy sign.

Common bile duct: Diameter: 6 mm

Liver: Inhomogeneous increased echotexture without focal abnormality
or intrahepatic ductal dilatation

IVC: No abnormality visualized.

Pancreas: Visualized portion unremarkable.

Spleen: Size and appearance within normal limits.

Right Kidney: Length: 11.7 cm. Normal echogenicity. No solid mass.
Mid renal cortical cyst measures 2.0 x 1.7 x 1.5 cm.

Left Kidney: Length: 11.6 cm. Echogenicity within normal limits. No
mass or hydronephrosis visualized.

Abdominal aorta: No aneurysm visualized.

Other findings: None.
IMPRESSION: Increased hepatic echogenicity without focal abnormality, likely due
to fatty infiltration although other infiltrative disorders may
appear similar.

Stone filled gallbladder without other sonographic evidence for
acute cholecystitis.

## 2016-01-14 ENCOUNTER — Telehealth: Payer: Self-pay | Admitting: Internal Medicine

## 2016-01-14 MED ORDER — GLUCOSE BLOOD VI STRP: ORAL_STRIP | 0 refills | Status: DC

## 2016-01-14 NOTE — Telephone Encounter (Signed)
Test strips refilled

## 2016-01-14 NOTE — Telephone Encounter (Signed)
Patient would like a rx for testing strips. Patient was instructed to make appt. Has not been seen in a year. Please follow up

## 2016-01-21 ENCOUNTER — Ambulatory Visit: Payer: Medicare Other | Admitting: Internal Medicine
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
## 2016-06-09 ENCOUNTER — Emergency Department (HOSPITAL_COMMUNITY): Payer: Medicare Other

## 2016-06-09 ENCOUNTER — Emergency Department (HOSPITAL_COMMUNITY)
Admission: EM | Admit: 2016-06-09 | Discharge: 2016-06-09 | Disposition: A | Discharge status: Home / Self Care | Payer: Medicare Other | Attending: Emergency Medicine | Admitting: Emergency Medicine

## 2016-06-09 ENCOUNTER — Encounter (HOSPITAL_COMMUNITY): Payer: Self-pay | Admitting: Family Medicine

## 2016-06-09 DIAGNOSIS — F17210 Nicotine dependence, cigarettes, uncomplicated: Secondary | ICD-10-CM | POA: Insufficient documentation

## 2016-06-09 DIAGNOSIS — E119 Type 2 diabetes mellitus without complications: Secondary | ICD-10-CM | POA: Diagnosis not present

## 2016-06-09 DIAGNOSIS — Z7984 Long term (current) use of oral hypoglycemic drugs: Secondary | ICD-10-CM | POA: Diagnosis not present

## 2016-06-09 DIAGNOSIS — R072 Precordial pain: Secondary | ICD-10-CM | POA: Diagnosis not present

## 2016-06-09 DIAGNOSIS — R079 Chest pain, unspecified: Secondary | ICD-10-CM

## 2016-06-09 DIAGNOSIS — J45909 Unspecified asthma, uncomplicated: Secondary | ICD-10-CM | POA: Insufficient documentation

## 2016-06-09 DIAGNOSIS — Z79899 Other long term (current) drug therapy: Secondary | ICD-10-CM | POA: Insufficient documentation

## 2016-06-09 DIAGNOSIS — R55 Syncope and collapse: Secondary | ICD-10-CM | POA: Insufficient documentation

## 2016-06-09 DIAGNOSIS — R0602 Shortness of breath: Secondary | ICD-10-CM | POA: Diagnosis not present

## 2016-06-09 DIAGNOSIS — R404 Transient alteration of awareness: Secondary | ICD-10-CM | POA: Diagnosis not present

## 2016-06-09 DIAGNOSIS — R0789 Other chest pain: Secondary | ICD-10-CM | POA: Diagnosis not present

## 2016-06-09 LAB — I-STAT TROPONIN, ED: Troponin i, poc: 0.01 ng/mL (ref 0.00–0.08)

## 2016-06-09 LAB — CBC WITH DIFFERENTIAL/PLATELET
Basophils Absolute: 0 10*3/uL (ref 0.0–0.1)
Basophils Relative: 0 %
Eosinophils Absolute: 0 10*3/uL (ref 0.0–0.7)
Eosinophils Relative: 0 %
HCT: 42.8 % (ref 39.0–52.0)
Hemoglobin: 14.8 g/dL (ref 13.0–17.0)
Lymphocytes Relative: 32 %
Lymphs Abs: 1.6 10*3/uL (ref 0.7–4.0)
MCH: 27.1 pg (ref 26.0–34.0)
MCHC: 34.6 g/dL (ref 30.0–36.0)
MCV: 78.4 fL (ref 78.0–100.0)
Monocytes Absolute: 0.3 10*3/uL (ref 0.1–1.0)
Monocytes Relative: 7 %
Neutro Abs: 2.9 10*3/uL (ref 1.7–7.7)
Neutrophils Relative %: 61 %
Platelets: 135 10*3/uL — ABNORMAL LOW (ref 150–400)
RBC: 5.46 MIL/uL (ref 4.22–5.81)
RDW: 13.5 % (ref 11.5–15.5)
WBC: 4.9 10*3/uL (ref 4.0–10.5)

## 2016-06-09 LAB — URINALYSIS, ROUTINE W REFLEX MICROSCOPIC
Bacteria, UA: NONE SEEN
Bilirubin Urine: NEGATIVE
Glucose, UA: NEGATIVE mg/dL
Hgb urine dipstick: NEGATIVE
Ketones, ur: NEGATIVE mg/dL
Nitrite: NEGATIVE
Protein, ur: NEGATIVE mg/dL
Specific Gravity, Urine: 1.012 (ref 1.005–1.030)
pH: 5 (ref 5.0–8.0)

## 2016-06-09 LAB — COMPREHENSIVE METABOLIC PANEL
ALT: 17 U/L (ref 17–63)
AST: 16 U/L (ref 15–41)
Albumin: 3.6 g/dL (ref 3.5–5.0)
Alkaline Phosphatase: 72 U/L (ref 38–126)
Anion gap: 4 — ABNORMAL LOW (ref 5–15)
BUN: 20 mg/dL (ref 6–20)
CO2: 27 mmol/L (ref 22–32)
Calcium: 9.1 mg/dL (ref 8.9–10.3)
Chloride: 108 mmol/L (ref 101–111)
Creatinine, Ser: 1.18 mg/dL (ref 0.61–1.24)
GFR calc Af Amer: >60 mL/min (ref >60)
GFR calc non Af Amer: >60 mL/min (ref >60)
Glucose, Bld: 107 mg/dL — ABNORMAL HIGH (ref 65–99)
Potassium: 4.1 mmol/L (ref 3.5–5.1)
Sodium: 139 mmol/L (ref 135–145)
Total Bilirubin: 0.6 mg/dL (ref 0.3–1.2)
Total Protein: 6.9 g/dL (ref 6.5–8.1)

## 2016-06-09 LAB — PROTIME-INR
INR: 1.01
Prothrombin Time: 13.3 seconds (ref 11.4–15.2)

## 2016-06-09 LAB — LIPASE, BLOOD: Lipase: 30 U/L (ref 11–51)

## 2016-06-09 LAB — CK: Total CK: 63 U/L (ref 49–397)

## 2016-06-09 LAB — I-STAT CG4 LACTIC ACID, ED: Lactic Acid, Venous: 0.45 mmol/L — ABNORMAL LOW (ref 0.5–1.9)

## 2016-06-09 LAB — D-DIMER, QUANTITATIVE: D-Dimer, Quant: <0.27 ug/mL-FEU (ref 0.00–0.50)

## 2016-06-09 MED ORDER — ASPIRIN 81 MG PO CHEW
324.0000 mg | CHEWABLE_TABLET | Freq: Once | ORAL | Status: AC
  Administered 2016-06-09: 324 mg via ORAL
  Filled 2016-06-09: qty 4

## 2016-06-09 MED ORDER — SODIUM CHLORIDE 0.9 % IV BOLUS (SEPSIS)
1000.0000 mL | Freq: Once | INTRAVENOUS | Status: AC
  Administered 2016-06-09: 1000 mL via INTRAVENOUS

## 2016-06-09 NOTE — Discharge Instructions (Signed)
Please stay hydrated. Please follow-up with a primary care physician for further workup and management of her episode today. We had a shared decision-making conversation and decided that you are comfortable with discharge without further workup today. Given the complete resolution of symptoms, this seems reasonable. If any symptoms change, worsen, or return, please return to the nearest emergency for further workup.
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
## 2016-06-09 NOTE — ED Notes (Signed)
Provided patient juice and graham crackers with permission from provider.

## 2016-06-09 NOTE — ED Notes (Signed)
Bed: IX18 Expected date:  Expected time:  Means of arrival:  Comments: 61 yo EMS

## 2016-06-09 NOTE — ED Provider Notes (Signed)
[facility] DEPT Provider Note   CSN: [id number] Arrival date & time: 06/09/16  1435     History   Chief Complaint No chief complaint on file.   HPI Lee Reid is a 61 y.o. male.  The history is provided by the patient, medical records and the EMS personnel. No language interpreter was used.  Chest Pain   This is a new problem. The current episode started less than 1 hour ago. The problem occurs rarely. The problem has been resolved. The pain is present in the substernal region. The pain is at a severity of 10/10. The pain is severe. The quality of the pain is described as sharp and pressure-like. The pain does not radiate. Associated symptoms include malaise/fatigue, nausea and shortness of breath. Pertinent negatives include no abdominal pain, no back pain, no cough, no diaphoresis, no exertional chest pressure, no fever, no headaches, no hemoptysis, no lower extremity edema, no near-syncope, no palpitations, no vomiting and no weakness. He has tried nothing (rest) for the symptoms. The treatment provided significant relief.  Pertinent negatives for past medical history include no CAD.    Past Medical History:  Diagnosis Date  . Anginal pain ([place])   . Anxiety   . Asthma   . Depression   . Diabetes mellitus without complication ([place])   . Dysrhythmia    irregular heartbeat due to stress  . GERD (gastroesophageal reflux disease)   . Heart murmur    born with heart murmur  . Pneumonia     Patient Active Problem List   Diagnosis Date Noted  . ARF (acute renal failure) ([place]) 09/28/2014  . Abdominal wall cellulitis 09/27/2014  . Cellulitis of abdominal wall 09/27/2014  . S/P cholecystectomy 09/27/2014  . Chronic cholecystitis 09/16/2014  . Preoperative clearance 08/29/2014  . Type 2 diabetes mellitus without complication ([place]) 44/03/4740  . Type 2 diabetes mellitus without complications ([place]) 59/56/3875  . Morbid obesity ([place]) 07/29/2014  . Colon cancer screening  07/29/2014    Past Surgical History:  Procedure Laterality Date  . CHOLECYSTECTOMY N/A 09/16/2014   Procedure: LAPAROSCOPIC CHOLECYSTECTOMY ;  Surgeon: Arta Bruce Kinsinger, MD;  Location: [facility];  Service: General;  Laterality: N/A;  . EYE SURGERY    . TESTICLE SURGERY     growth removed  . TONSILLECTOMY         Home Medications    Prior to Admission medications   Medication Sig Start Date End Date Taking? Authorizing Provider  canagliflozin (INVOKANA) 100 MG TABS tablet Take 1 tablet (100 mg total) by mouth daily. Patient not taking: Reported on 07/08/2015 08/29/14   Tresa Garter, MD  cephALEXin (KEFLEX) 500 MG capsule Take 1 capsule (500 mg total) by mouth 4 (four) times daily. Patient not taking: Reported on 07/29/2015 07/02/15   Palumbo, April, MD  doxycycline (VIBRAMYCIN) 100 MG capsule Take 1 capsule (100 mg total) by mouth 2 (two) times daily. One po bid x 7 days Patient not taking: Reported on 07/29/2015 07/02/15   Palumbo, April, MD  glipiZIDE (GLUCOTROL XL) 5 MG 24 hr tablet Take 1 tablet (5 mg total) by mouth daily with breakfast. Patient not taking: Reported on 07/08/2015 08/02/14   Tresa Garter, MD  glucose blood (ACCU-CHEK SMARTVIEW) test strip USE AS INSTRUCTED FOR 3 TIMES DAILY TESTING OF BLOOD GLUCOSE. E11.9 01/14/16   Tresa Garter, MD  HYDROcodone-acetaminophen (NORCO/VICODIN) 5-325 MG per tablet Take 1 tablet by mouth every 6 (six) hours as needed for severe pain. Patient  not taking: Reported on 07/02/2015 10/07/14   Junius Creamer, NP  ibuprofen (ADVIL,MOTRIN) 600 MG tablet Take 1 tablet (600 mg total) by mouth every 6 (six) hours as needed for moderate pain. Patient not taking: Reported on 07/02/2015 10/07/14   Junius Creamer, NP  metFORMIN (GLUCOPHAGE) 1000 MG tablet Take 1 tablet (1,000 mg total) by mouth 2 (two) times daily with a meal. Patient not taking: Reported on 07/02/2015 09/30/14   Debbe Odea, MD  methocarbamol (ROBAXIN) 500 MG tablet Take 1  tablet (500 mg total) by mouth 2 (two) times daily. 07/29/15   Alfonzo Beers, MD  naproxen (NAPROSYN) 375 MG tablet Take 1 tablet (375 mg total) by mouth 2 (two) times daily. Patient not taking: Reported on 07/08/2015 07/02/15   Palumbo, April, MD  naproxen (NAPROSYN) 375 MG tablet Take 375 mg by mouth 2 (two) times daily with a meal.    [provider]  oxyCODONE-acetaminophen (PERCOCET/ROXICET) 5-325 MG tablet Take 1 tablet by mouth every 4 (four) hours as needed for severe pain. Patient not taking: Reported on 07/29/2015 07/08/15   Charlann Lange, PA-C    Family History Family History  Problem Relation Age of Onset  . Hypertension Mother   . Heart disease Father   . Cancer Father     Social History Social History  Substance Use Topics  . Smoking status: Current Every Day Smoker    Packs/day: 0.50    Types: Cigarettes  . Smokeless tobacco: Never Used  . Alcohol use No     Allergies   Patient has no known allergies.   Review of Systems Review of Systems  Constitutional: Positive for fatigue and malaise/fatigue. Negative for chills, diaphoresis and fever.  HENT: Negative for congestion.   Respiratory: Positive for shortness of breath. Negative for cough, hemoptysis, chest tightness, wheezing and stridor.   Cardiovascular: Positive for chest pain. Negative for palpitations, leg swelling and near-syncope.  Gastrointestinal: Positive for nausea. Negative for abdominal pain and vomiting.  Genitourinary: Negative for flank pain.  Musculoskeletal: Negative for back pain, neck pain and neck stiffness.  Skin: Negative for rash and wound.  Neurological: Positive for light-headedness. Negative for syncope, facial asymmetry, speech difficulty, weakness and headaches.  Psychiatric/Behavioral: Negative for agitation and confusion.  All other systems reviewed and are negative.    Physical Exam Updated Vital Signs BP (!) 147/91 (BP Location: Right Arm)   Pulse (!) 58   Temp  98 F (36.7 C) (Oral)   Resp 20   SpO2 100%   Physical Exam  Constitutional: He is oriented to person, place, and time. He appears well-developed and well-nourished. No distress.  HENT:  Head: Normocephalic and atraumatic.  Nose: Nose normal.  Mouth/Throat: Oropharynx is clear and moist. No oropharyngeal exudate.  Eyes: Conjunctivae and EOM are normal. Pupils are equal, round, and reactive to light.  Neck: Neck supple.  Cardiovascular: Normal rate, regular rhythm and intact distal pulses.   No murmur heard. Pulmonary/Chest: Effort normal and breath sounds normal. No respiratory distress. He has no wheezes. He has no rales. He exhibits no tenderness.  Abdominal: Soft. There is no tenderness.  Musculoskeletal: He exhibits no edema.  Neurological: He is alert and oriented to person, place, and time. No cranial nerve deficit or sensory deficit. He exhibits normal muscle tone. Coordination normal.  Skin: Skin is warm and dry. Capillary refill takes less than 2 seconds. He is not diaphoretic. No erythema. No pallor.  Psychiatric: He has a normal mood and affect.  Nursing  note and vitals reviewed.    ED Treatments / Results  Labs (all labs ordered are listed, but only abnormal results are displayed) Labs Reviewed  CBC WITH DIFFERENTIAL/PLATELET - Abnormal; Notable for the following:       Result Value   Platelets 135 (*)    All other components within normal limits  COMPREHENSIVE METABOLIC PANEL - Abnormal; Notable for the following:    Glucose, Bld 107 (*)    Anion gap 4 (*)    All other components within normal limits  URINALYSIS, ROUTINE W REFLEX MICROSCOPIC - Abnormal; Notable for the following:    Leukocytes, UA TRACE (*)    Squamous Epithelial / LPF 0-5 (*)    All other components within normal limits  I-STAT CG4 LACTIC ACID, ED - Abnormal; Notable for the following:    Lactic Acid, Venous 0.45 (*)    All other components within normal limits  PROTIME-INR  D-DIMER,  QUANTITATIVE (NOT AT [facility])  CK  LIPASE, BLOOD  I-STAT TROPOININ, ED    EKG  EKG Interpretation  Date/Time:  Wednesday Jun 09 2016 15:18:26 EDT Ventricular Rate:  64 PR Interval:    QRS Duration: 146 QT Interval:  435 QTC Calculation: 449 R Axis:   -132 Text Interpretation:  Sinus or ectopic atrial rhythm Probable left atrial enlargement IVCD, consider atypical RBBB Repol abnrm suggests ischemia, lateral leads When compared to prior, new t wave inversiosn inleads 1,2,AVL No STEMI Confirmed by Antony Blackbird [phone]) on 06/09/2016 3:34:31 PM       Radiology Dg Chest 2 View  Result Date: 06/09/2016 CLINICAL DATA:  Syncope with shortness of breath EXAM: CHEST  2 VIEW COMPARISON:  07/29/2015, CT 09/27/2014 FINDINGS: Borderline cardiomegaly. No edema, infiltrate or effusion. No pneumothorax. IMPRESSION: Borderline to mild cardiomegaly.  No edema or infiltrate. Electronically Signed   By: Donavan Foil M.D.   On: 06/09/2016 16:15    Procedures Procedures (including critical care time)  Medications Ordered in ED Medications  sodium chloride 0.9 % bolus 1,000 mL (0 mLs Intravenous Stopped 06/09/16 1654)  aspirin chewable tablet 324 mg (324 mg Oral Given 06/09/16 1556)     Initial Impression / Assessment and Plan / ED Course  I have reviewed the triage vital signs and the nursing notes.  Pertinent labs & imaging results that were available during my care of the patient were reviewed by me and considered in my medical decision making (see chart for details).     Lee Reid is a 61 y.o. male with a past medical history significant for diabetes, asthma, GERD, anxiety, depression, and blindness of the left eye who presents with an episode of chest pain, shortness of breath, near-syncope, and fatigue. Patient says that he was working outside today when he had an episode. He says that he got very lightheaded and fatigued. He said he nearly passed out. He said he felt chest pressure in  his central chest. He describes it as 10 out of 10 severity and "an elephant on my chest". He reports that he sat down to rest and call for help which improved his symptoms. He reports that he felt diaphoretic during the episode. He is currently symptom-free. He said that he was lightheaded and had tingling all over. He says that he has been feeling fine over the last several days and this morning was at his baseline. He denies recent fevers, chills, nausea, vomiting, conservation, diarrhea, dysuria. He denies rashes or new medications. He says he has been  eating normally. He says he has had a history of lightheaded spells in the past but this felt "different".  EMS report the patient's blood pressure is 96/72 on arrival. Patient feels much better after resting inside. Patient thought he might have been overheated  History and exam are seen above. On exam, patient has clear lungs. Chest nontender. No significant murmurs. Abdomen nontender. No focal neurologic deficits. Patient has left eye blindness and has nystagmus. Patient said his vision is as baseline. Patient denies other complaints and had no other findings on exam.   Initial EKG shows no STEMI but does show new T-wave inversions in lead 1, only 2, and aVL compared to prior.   Patient will have full workup to look for etiology of chest pain and near syncopal episode.  Patient's laboratory testing results are seen above. Initial troponin negative. Lactic acid nonelevated. D-dimer negative. Doubt PE. CK nonelevated. Other laboratory testing was reassuring. Chest x-ray reveals no pneumonia or edema.   Patient reports that he wants to be discharged. He was not interested in staying for a second troponin were for further workup. Patient reports feeling much better after some fluids. Suspect that patient had a vagal type lightheadedness spell in the setting of working outside and being dehydrated. Patient was advised that given his age and history,  there is still concern for possible cardiac etiology however patient says that he will follow-up with his PCP and a cardiologist for further management.   Patient understands risks of worsening condition or recurrence of symptoms and understood return precautions. Patient had no other questions or concerns and was discharged in good condition with normal gait and no further symptoms.     Final Clinical Impressions(s) / ED Diagnoses   Final diagnoses:  Near syncope  Chest pain, unspecified type    New Prescriptions Discharge Medication List as of 06/09/2016  5:46 PM      Clinical Impression: 1. Near syncope   2. Chest pain, unspecified type     Disposition: Discharge  Condition: Good  I have discussed the results, Dx and Tx plan with the pt(& family if present). He/she/they expressed understanding and agree(s) with the plan. Discharge instructions discussed at great length. Strict return precautions discussed and pt &/or family have verbalized understanding of the instructions. No further questions at time of discharge.    Discharge Medication List as of 06/09/2016  5:46 PM      Follow Up: [facility] [address] [place] [address] [phone] Schedule an appointment as soon as possible for a visit    [facility] DEPT [place] [id number] [place] [phone]  If symptoms worsen      Tegeler, Gwenyth Allegra, MD 06/10/16 [phone]

## 2017-03-22 IMAGING — CR DG ABDOMEN ACUTE W/ 1V CHEST
4 series · 4 of 4 positions shown · non-contrast
Comparison: 01/10/2013 chest x-ray.

CLINICAL DATA: 59-year-old male with right upper quadrant pain over
the past 4 days. Initial encounter.

EXAM:
DG ABDOMEN ACUTE W/ 1V CHEST

[w chest pa]
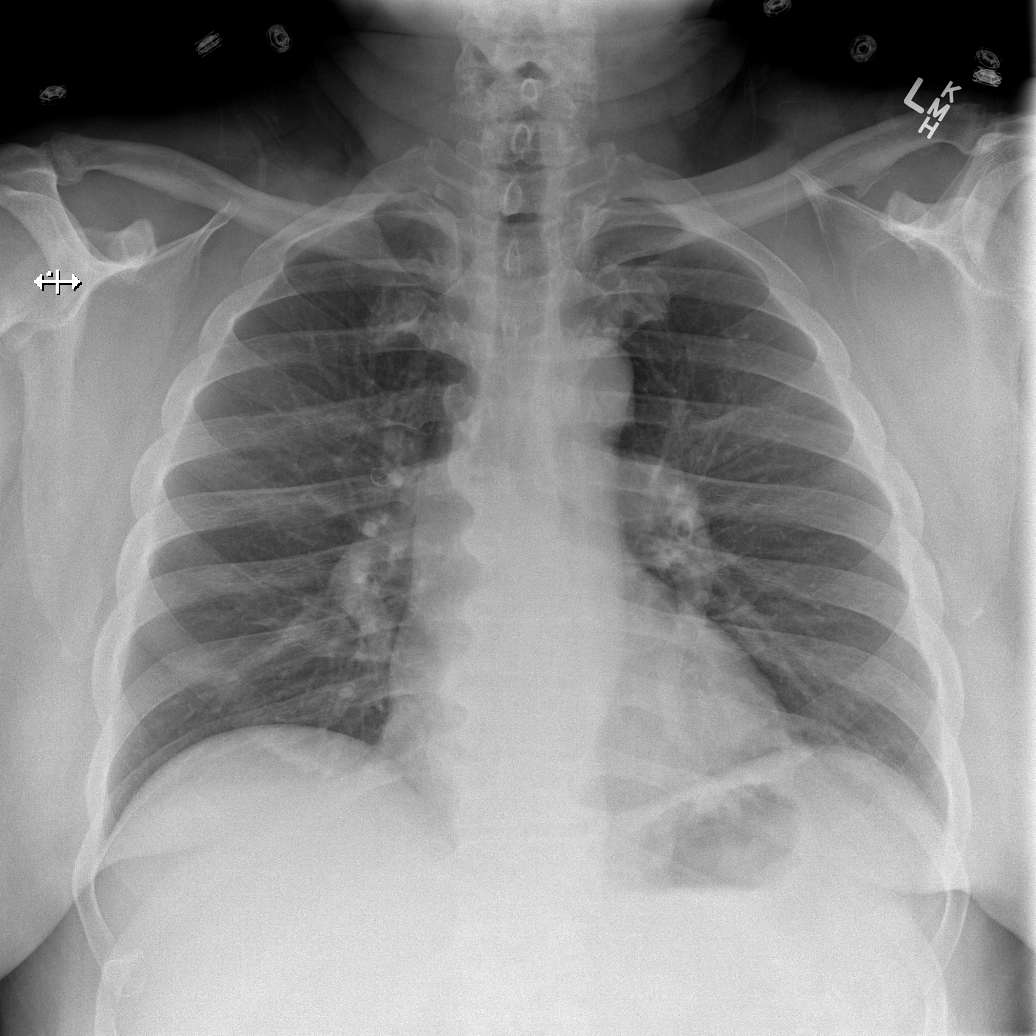

[w abdomen upright]
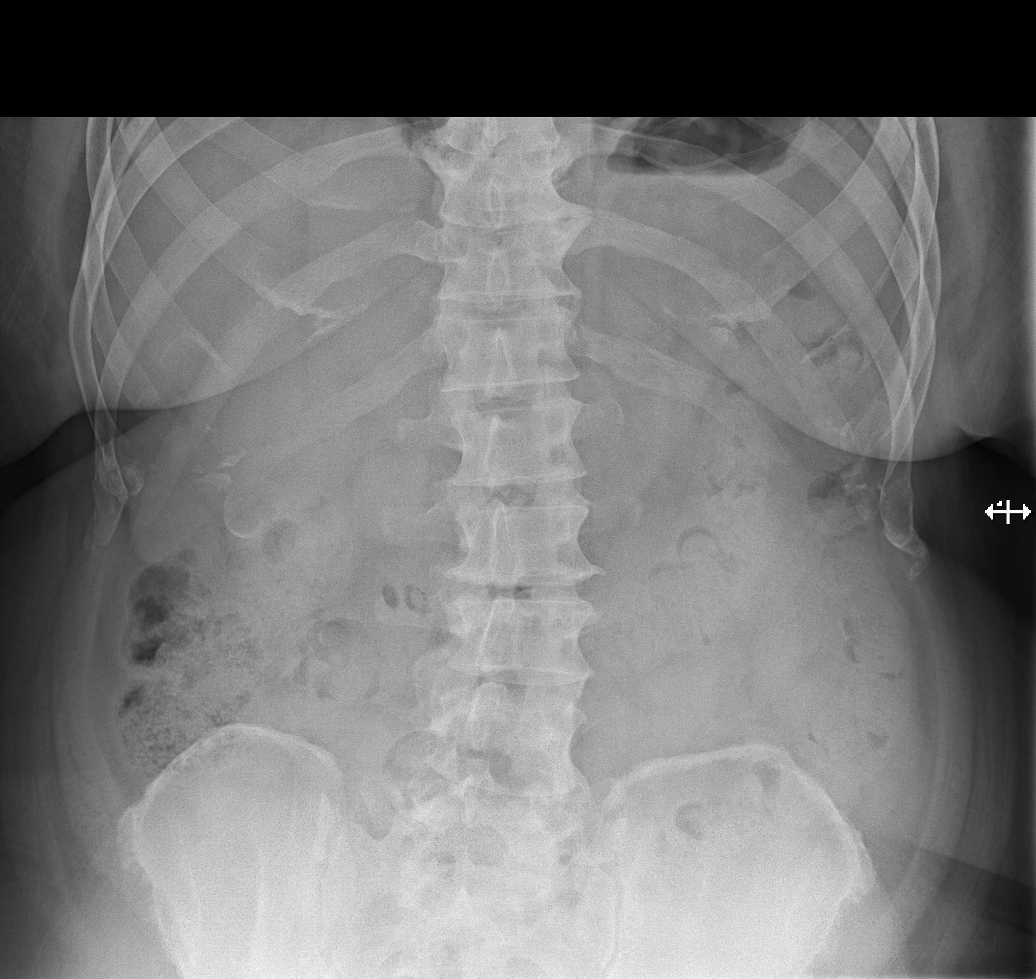

[t abdomen supine (1 of 2)]
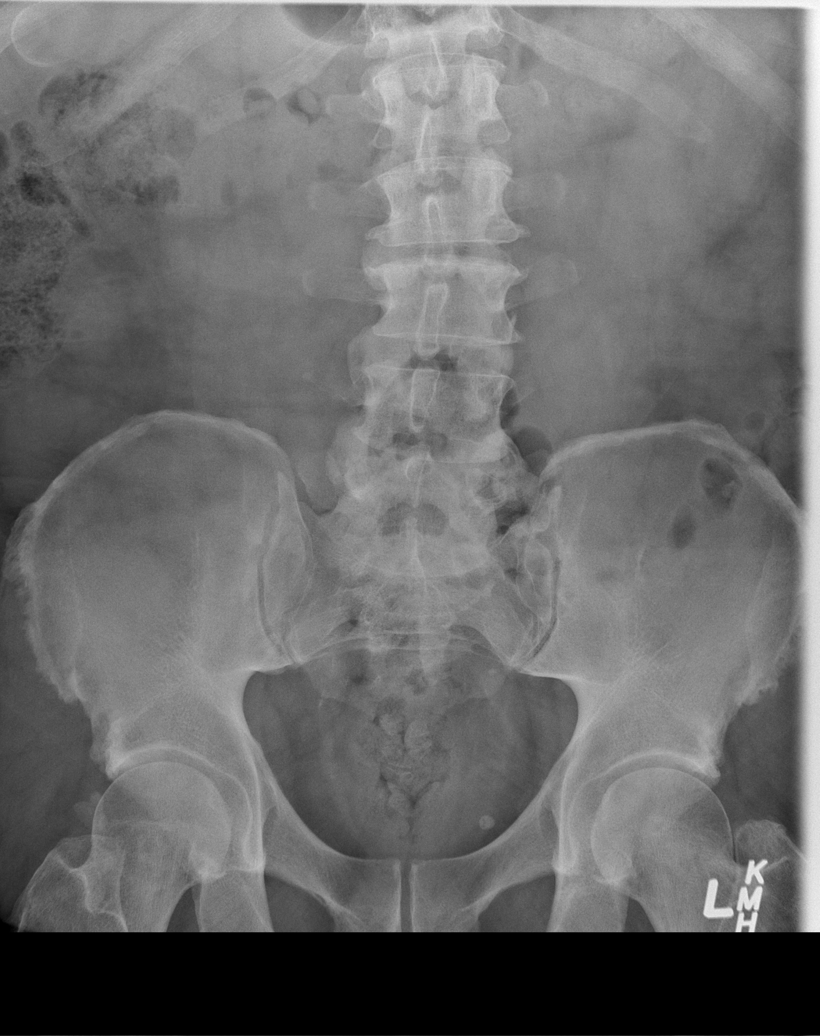

[t abdomen supine (2 of 2)]
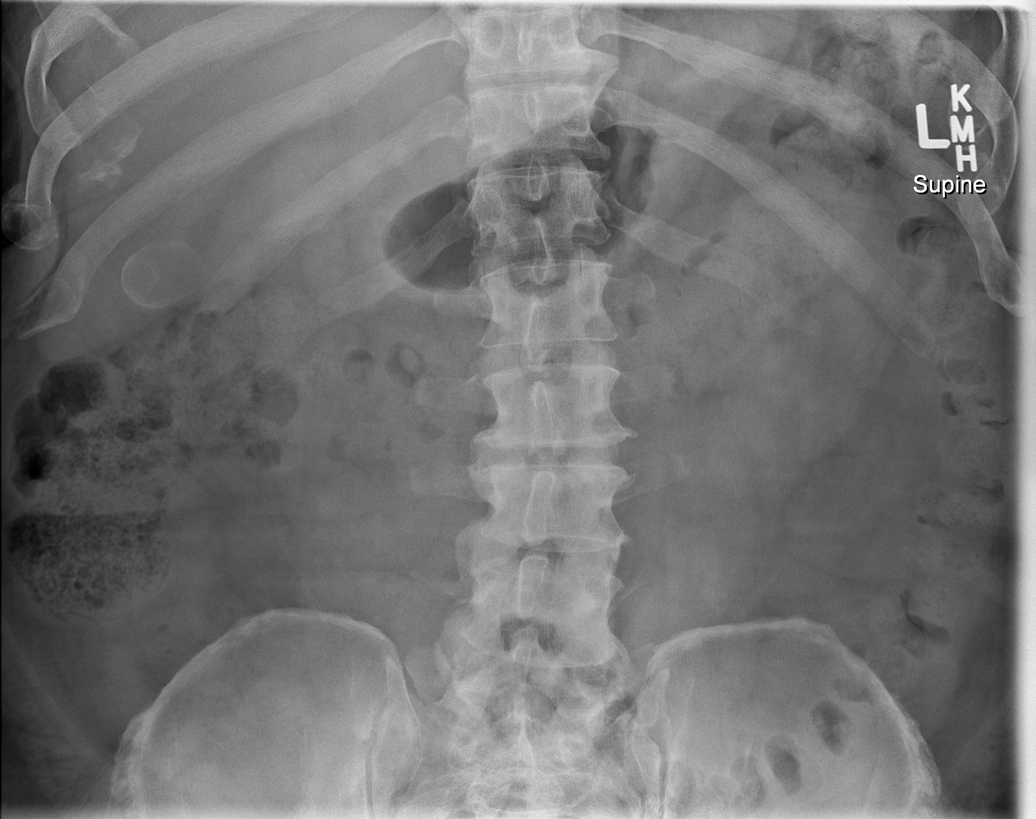

[4 of 4 positions shown; findings below may reference images not displayed]

FINDINGS: Central pulmonary vascular prominence with minimal peribronchial
thickening stable.

Minimally tortuous aorta.

Heart size within normal limits.

No infiltrate, congestive heart failure or pneumothorax.

No plain film evidence of pulmonary malignancy.

Calcified gallstone measuring up to 3.5 cm.

Stool throughout the colon without plain film evidence of bowel
obstruction or free intraperitoneal air.

Scoliosis and degenerative changes.
IMPRESSION: No acute pulmonary abnormality as noted above.

3.5 cm calcified gallstone

Stool throughout colon without evidence of bowel obstruction or free
intraperitoneal air.

## 2017-03-25 ENCOUNTER — Emergency Department (HOSPITAL_COMMUNITY)
Admission: EM | Admit: 2017-03-25 | Discharge: 2017-03-25 | Disposition: A | Discharge status: Home / Self Care | Payer: Medicare Other | Attending: Emergency Medicine | Admitting: Emergency Medicine

## 2017-03-25 ENCOUNTER — Other Ambulatory Visit: Payer: Self-pay

## 2017-03-25 ENCOUNTER — Encounter (HOSPITAL_COMMUNITY): Payer: Self-pay | Admitting: Emergency Medicine

## 2017-03-25 DIAGNOSIS — T63484A Toxic effect of venom of other arthropod, undetermined, initial encounter: Secondary | ICD-10-CM | POA: Diagnosis not present

## 2017-03-25 DIAGNOSIS — Y999 Unspecified external cause status: Secondary | ICD-10-CM | POA: Diagnosis not present

## 2017-03-25 DIAGNOSIS — J45909 Unspecified asthma, uncomplicated: Secondary | ICD-10-CM | POA: Insufficient documentation

## 2017-03-25 DIAGNOSIS — Z79899 Other long term (current) drug therapy: Secondary | ICD-10-CM | POA: Diagnosis not present

## 2017-03-25 DIAGNOSIS — Z7984 Long term (current) use of oral hypoglycemic drugs: Secondary | ICD-10-CM | POA: Diagnosis not present

## 2017-03-25 DIAGNOSIS — F17210 Nicotine dependence, cigarettes, uncomplicated: Secondary | ICD-10-CM | POA: Diagnosis not present

## 2017-03-25 DIAGNOSIS — Y92039 Unspecified place in apartment as the place of occurrence of the external cause: Secondary | ICD-10-CM | POA: Insufficient documentation

## 2017-03-25 DIAGNOSIS — W57XXXA Bitten or stung by nonvenomous insect and other nonvenomous arthropods, initial encounter: Secondary | ICD-10-CM | POA: Diagnosis not present

## 2017-03-25 DIAGNOSIS — E119 Type 2 diabetes mellitus without complications: Secondary | ICD-10-CM | POA: Diagnosis not present

## 2017-03-25 DIAGNOSIS — Y939 Activity, unspecified: Secondary | ICD-10-CM | POA: Diagnosis not present

## 2017-03-25 DIAGNOSIS — T7840XA Allergy, unspecified, initial encounter: Secondary | ICD-10-CM | POA: Diagnosis not present

## 2017-03-25 DIAGNOSIS — S40861A Insect bite (nonvenomous) of right upper arm, initial encounter: Secondary | ICD-10-CM | POA: Diagnosis not present

## 2017-03-25 DIAGNOSIS — S40862A Insect bite (nonvenomous) of left upper arm, initial encounter: Secondary | ICD-10-CM | POA: Diagnosis not present

## 2017-03-25 DIAGNOSIS — R0602 Shortness of breath: Secondary | ICD-10-CM | POA: Diagnosis not present

## 2017-03-25 MED ORDER — DIPHENHYDRAMINE HCL 25 MG PO TABS: 25.0000 mg | ORAL_TABLET | Freq: Four times a day (QID) | ORAL | 0 refills | Status: DC

## 2017-03-25 MED ORDER — HYDROCORTISONE 2.5 % EX LOTN: TOPICAL_LOTION | Freq: Two times a day (BID) | CUTANEOUS | 0 refills | Status: DC

## 2017-03-25 NOTE — Discharge Instructions (Signed)
Apply hydrocortisone lotion twice daily to affected areas.  Take Benadryl every 6 hours as needed for itching.  Talk to her apartment complex about the situation. As you requested, the [place] [facility] phone number is [phone]. The [facility] number is [phone].

## 2017-03-25 NOTE — ED Triage Notes (Signed)
Pt brought in by ems from home with c/o multiple bites on arms, torso, and head  Pt states he recently moved there and since has been being bitten by something

## 2017-03-25 NOTE — ED Provider Notes (Signed)
[facility] DEPT Provider Note   CSN: [id number] Arrival date & time: 03/25/17  1062     History   Chief Complaint Chief Complaint  Patient presents with  . insect bites    HPI Lee Reid is a 62 y.o. male with history of asthma, diabetes who presents with insect bites.  Patient reports he recently moved into an apartment complex that has mites or bedbugs.  He has several bites on his arms, legs, head, torso.  He reports spraying bedbugs spray for his home on his body, which made him feel short of breath.  He is feeling better now that he is out of the house.  His pain he is not taking any medicine or put any creams on his bites.  He reports telling his apartment complex about the issue and they only gave him a bottle of spray.  HPI  Past Medical History:  Diagnosis Date  . Anginal pain ([place])   . Anxiety   . Asthma   . Depression   . Diabetes mellitus without complication ([place])   . Dysrhythmia    irregular heartbeat due to stress  . GERD (gastroesophageal reflux disease)   . Heart murmur    born with heart murmur  . Pneumonia     Patient Active Problem List   Diagnosis Date Noted  . ARF (acute renal failure) ([place]) 09/28/2014  . Abdominal wall cellulitis 09/27/2014  . Cellulitis of abdominal wall 09/27/2014  . S/P cholecystectomy 09/27/2014  . Chronic cholecystitis 09/16/2014  . Preoperative clearance 08/29/2014  . Type 2 diabetes mellitus without complication ([place]) 69/48/5462  . Type 2 diabetes mellitus without complications ([place]) 70/35/0093  . Morbid obesity ([place]) 07/29/2014  . Colon cancer screening 07/29/2014    Past Surgical History:  Procedure Laterality Date  . CHOLECYSTECTOMY N/A 09/16/2014   Procedure: LAPAROSCOPIC CHOLECYSTECTOMY ;  Surgeon: Arta Bruce Kinsinger, MD;  Location: [facility];  Service: General;  Laterality: N/A;  . EYE SURGERY    . TESTICLE SURGERY     growth removed  . TONSILLECTOMY         Home  Medications    Prior to Admission medications   Medication Sig Start Date End Date Taking? Authorizing Provider  canagliflozin (INVOKANA) 100 MG TABS tablet Take 1 tablet (100 mg total) by mouth daily. Patient not taking: Reported on 07/08/2015 08/29/14   Tresa Garter, MD  cephALEXin (KEFLEX) 500 MG capsule Take 1 capsule (500 mg total) by mouth 4 (four) times daily. Patient not taking: Reported on 07/29/2015 07/02/15   Palumbo, April, MD  diphenhydrAMINE (BENADRYL) 25 MG tablet Take 1 tablet (25 mg total) by mouth every 6 (six) hours. 03/25/17   Nyasia Baxley, Bea Graff, PA-C  doxycycline (VIBRAMYCIN) 100 MG capsule Take 1 capsule (100 mg total) by mouth 2 (two) times daily. One po bid x 7 days Patient not taking: Reported on 07/29/2015 07/02/15   Palumbo, April, MD  glipiZIDE (GLUCOTROL XL) 5 MG 24 hr tablet Take 1 tablet (5 mg total) by mouth daily with breakfast. Patient not taking: Reported on 07/08/2015 08/02/14   Tresa Garter, MD  glucose blood (ACCU-CHEK SMARTVIEW) test strip USE AS INSTRUCTED FOR 3 TIMES DAILY TESTING OF BLOOD GLUCOSE. E11.9 01/14/16   Tresa Garter, MD  HYDROcodone-acetaminophen (NORCO/VICODIN) 5-325 MG per tablet Take 1 tablet by mouth every 6 (six) hours as needed for severe pain. Patient not taking: Reported on 07/02/2015 10/07/14   Junius Creamer, NP  hydrocortisone 2.5 %  lotion Apply topically 2 (two) times daily. 03/25/17   Seann Genther, Bea Graff, PA-C  ibuprofen (ADVIL,MOTRIN) 600 MG tablet Take 1 tablet (600 mg total) by mouth every 6 (six) hours as needed for moderate pain. Patient not taking: Reported on 07/02/2015 10/07/14   Junius Creamer, NP  metFORMIN (GLUCOPHAGE) 1000 MG tablet Take 1 tablet (1,000 mg total) by mouth 2 (two) times daily with a meal. Patient not taking: Reported on 07/02/2015 09/30/14   Debbe Odea, MD  methocarbamol (ROBAXIN) 500 MG tablet Take 1 tablet (500 mg total) by mouth 2 (two) times daily. Patient not taking: Reported on 06/09/2016 07/29/15    Pixie Casino, MD  naproxen (NAPROSYN) 375 MG tablet Take 1 tablet (375 mg total) by mouth 2 (two) times daily. Patient not taking: Reported on 07/08/2015 07/02/15   Palumbo, April, MD  oxyCODONE-acetaminophen (PERCOCET/ROXICET) 5-325 MG tablet Take 1 tablet by mouth every 4 (four) hours as needed for severe pain. Patient not taking: Reported on 07/29/2015 07/08/15   Charlann Lange, PA-C    Family History Family History  Problem Relation Age of Onset  . Hypertension Mother   . Heart disease Father   . Cancer Father     Social History Social History   Tobacco Use  . Smoking status: Current Every Day Smoker    Packs/day: 1.00    Types: Cigarettes  . Smokeless tobacco: Never Used  Substance Use Topics  . Alcohol use: No    Frequency: Never    Comment: former   . Drug use: No     Allergies   Bee venom   Review of Systems Review of Systems  Respiratory: Positive for shortness of breath (resolved).   Skin: Positive for wound.     Physical Exam Updated Vital Signs BP 129/81 (BP Location: Right Arm)   Pulse 72   Temp 97.8 F (36.6 C) (Oral)   Resp 16   Ht 6\' 1"  (1.854 m)   Wt 106.6 kg (235 lb)   SpO2 98%   BMI 31.00 kg/m   Physical Exam  Constitutional: He appears well-developed and well-nourished. No distress.  HENT:  Head: Normocephalic and atraumatic.  Mouth/Throat: Oropharynx is clear and moist. No oropharyngeal exudate.  Eyes: Conjunctivae are normal. Pupils are equal, round, and reactive to light. Right eye exhibits no discharge. Left eye exhibits no discharge. No scleral icterus.  Neck: Normal range of motion. Neck supple. No thyromegaly present.  Cardiovascular: Normal rate, regular rhythm, normal heart sounds and intact distal pulses. Exam reveals no gallop and no friction rub.  No murmur heard. Pulmonary/Chest: Effort normal and breath sounds normal. No stridor. No respiratory distress. He has no wheezes. He has no rales.  Abdominal: Soft. Bowel sounds  are normal. He exhibits no distension. There is no tenderness. There is no rebound and no guarding.  Musculoskeletal: He exhibits no edema.  Lymphadenopathy:    He has no cervical adenopathy.  Neurological: He is alert. Coordination normal.  Skin: Skin is warm and dry. No rash noted. He is not diaphoretic. No pallor.  Several small areas (<50mm) of erythema resembling insect bites on bilateral arms, few on the torso  Psychiatric: He has a normal mood and affect.  Nursing note and vitals reviewed.    ED Treatments / Results  Labs (all labs ordered are listed, but only abnormal results are displayed) Labs Reviewed - No data to display  EKG  EKG Interpretation None       Radiology No results found.  Procedures Procedures (including critical care time)  Medications Ordered in ED Medications - No data to display   Initial Impression / Assessment and Plan / ED Course  I have reviewed the triage vital signs and the nursing notes.  Pertinent labs & imaging results that were available during my care of the patient were reviewed by me and considered in my medical decision making (see chart for details).     Patient with several insect bites, presumably from mites or bed bugs. No signs of secondary infection at this time. Will treat with hydrocortisone and Benadryl.  Patient's lungs are clear and he is in no acute distress.  He reports since he is out of the situation and it has been a duration of time sine he used the bug spray, he is breathing fine now.  No chest x-ray or further workup indicated for this at this time.  Patient advised to talk to his apartment complex about the situation or find another living situation.  I advised patient not to use the bed bug spray for the house on his body.  Return precautions discussed.  Patient understands and agrees with plan.  Patient will stable throughout ED course and discharged in satisfactory condition.  Final Clinical Impressions(s) /  ED Diagnoses   Final diagnoses:  Insect bite, initial encounter    ED Discharge Orders        Ordered    diphenhydrAMINE (BENADRYL) 25 MG tablet  Every 6 hours     03/25/17 0735    hydrocortisone 2.5 % lotion  2 times daily     03/25/17 [place], Bea Graff, PA-C 03/25/17 0744    Quintella Reichert, MD 03/30/17 1231

## 2017-04-28 DIAGNOSIS — J45909 Unspecified asthma, uncomplicated: Secondary | ICD-10-CM | POA: Diagnosis not present

## 2017-04-28 DIAGNOSIS — M545 Low back pain: Secondary | ICD-10-CM | POA: Diagnosis not present

## 2017-04-28 DIAGNOSIS — F418 Other specified anxiety disorders: Secondary | ICD-10-CM | POA: Diagnosis not present

## 2017-04-28 DIAGNOSIS — Z136 Encounter for screening for cardiovascular disorders: Secondary | ICD-10-CM | POA: Diagnosis not present

## 2017-04-28 DIAGNOSIS — Z5181 Encounter for therapeutic drug level monitoring: Secondary | ICD-10-CM | POA: Diagnosis not present

## 2017-04-28 DIAGNOSIS — Z01118 Encounter for examination of ears and hearing with other abnormal findings: Secondary | ICD-10-CM | POA: Diagnosis not present

## 2017-04-28 DIAGNOSIS — Z113 Encounter for screening for infections with a predominantly sexual mode of transmission: Secondary | ICD-10-CM | POA: Diagnosis not present

## 2017-04-28 DIAGNOSIS — R1013 Epigastric pain: Secondary | ICD-10-CM | POA: Diagnosis not present

## 2017-04-28 DIAGNOSIS — E1165 Type 2 diabetes mellitus with hyperglycemia: Secondary | ICD-10-CM | POA: Diagnosis not present

## 2017-04-29 DIAGNOSIS — E1165 Type 2 diabetes mellitus with hyperglycemia: Secondary | ICD-10-CM | POA: Diagnosis not present

## 2017-04-29 DIAGNOSIS — R1013 Epigastric pain: Secondary | ICD-10-CM | POA: Diagnosis not present

## 2017-04-29 DIAGNOSIS — Z113 Encounter for screening for infections with a predominantly sexual mode of transmission: Secondary | ICD-10-CM | POA: Diagnosis not present

## 2017-04-29 DIAGNOSIS — F418 Other specified anxiety disorders: Secondary | ICD-10-CM | POA: Diagnosis not present

## 2017-04-29 DIAGNOSIS — Z01118 Encounter for examination of ears and hearing with other abnormal findings: Secondary | ICD-10-CM | POA: Diagnosis not present

## 2017-04-29 DIAGNOSIS — H538 Other visual disturbances: Secondary | ICD-10-CM | POA: Diagnosis not present

## 2017-04-29 DIAGNOSIS — J45909 Unspecified asthma, uncomplicated: Secondary | ICD-10-CM | POA: Diagnosis not present

## 2017-04-29 DIAGNOSIS — Z136 Encounter for screening for cardiovascular disorders: Secondary | ICD-10-CM | POA: Diagnosis not present

## 2017-04-29 DIAGNOSIS — M545 Low back pain: Secondary | ICD-10-CM | POA: Diagnosis not present

## 2017-04-29 DIAGNOSIS — Z5181 Encounter for therapeutic drug level monitoring: Secondary | ICD-10-CM | POA: Diagnosis not present

## 2017-04-29 IMAGING — CT CT HEAD W/O CM
2 series · 16 of 30 positions shown, 20 images · non-contrast
Comparison: CT head 08/28/2013.

CLINICAL DATA: Headache with RIGHT-sided numbness. Auditory changes
for 1 day, now resolved.

EXAM:
CT HEAD WITHOUT CONTRAST
TECHNIQUE: Contiguous axial images were obtained from the base of the skull
through the vertex without intravenous contrast.

[Series 2: head w/o · axial · non-contrast · 0.45mm/px · z∈[-204,-79]mm · 13 of 31 slices shown, 17 images]
[im 3/31  brain]
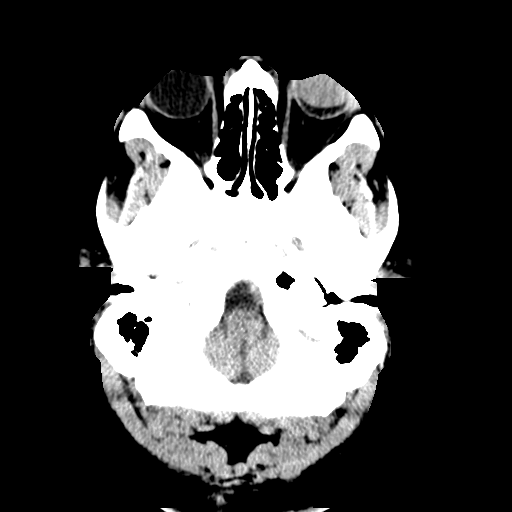
[im 3/31  bone]
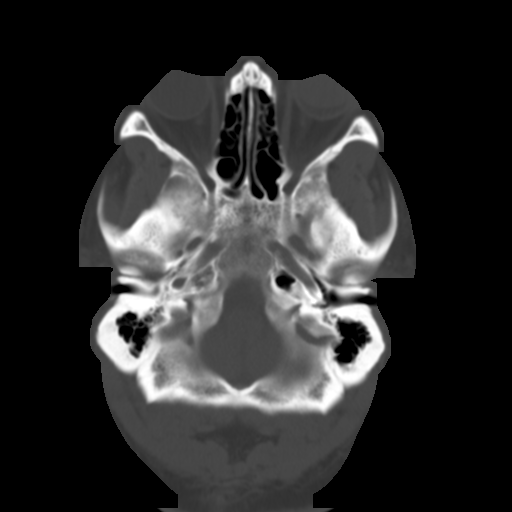
[im 5/31  brain]
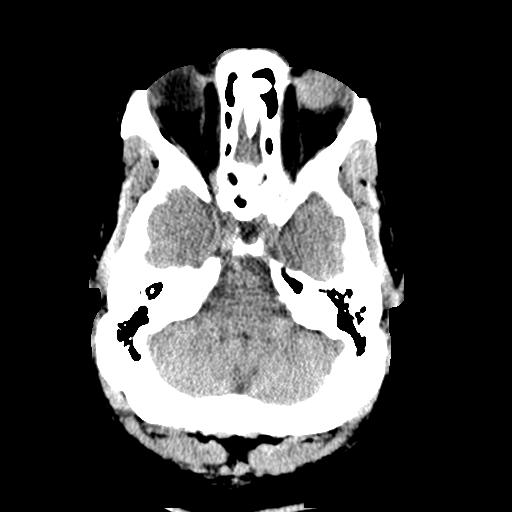
[im 7/31  brain]
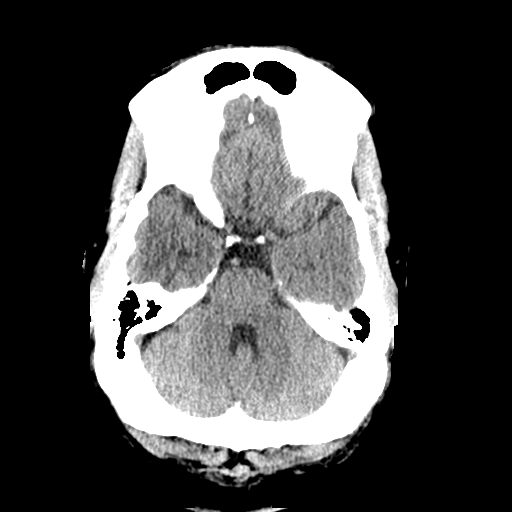
[im 9/31  brain]
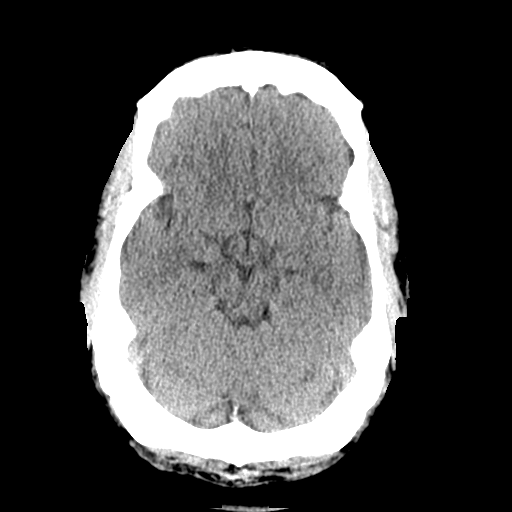
[im 11/31  brain]
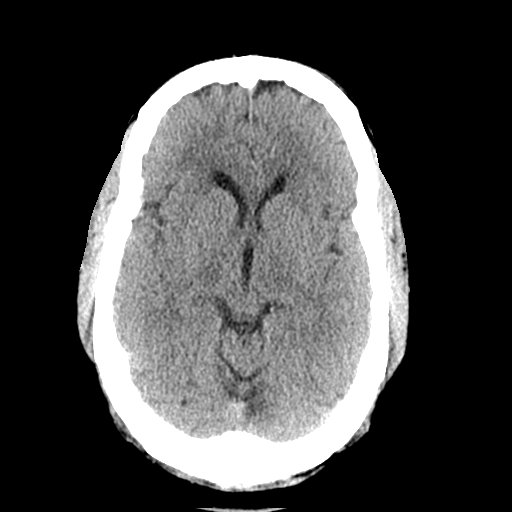
[im 11/31  bone]
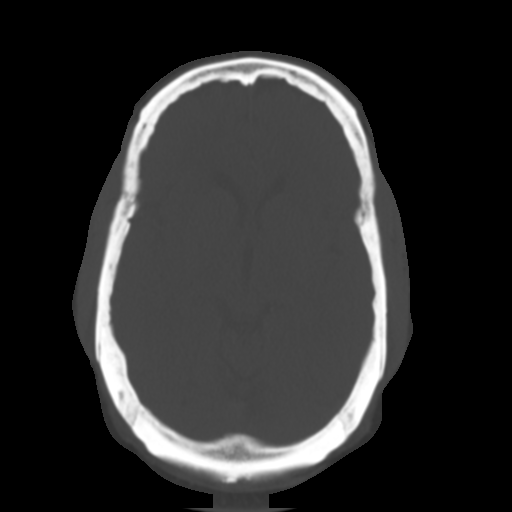
[im 13/31  brain]
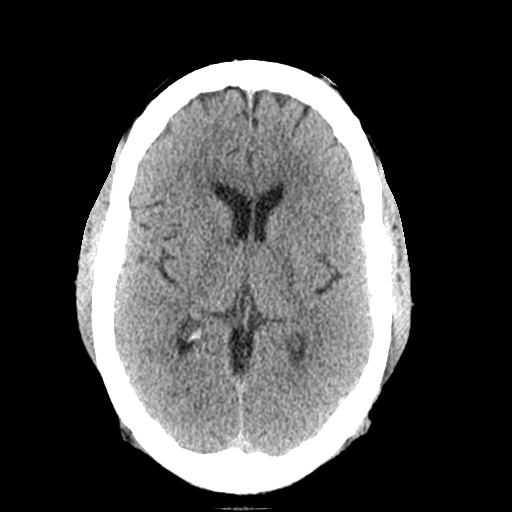
[im 16/31  brain]
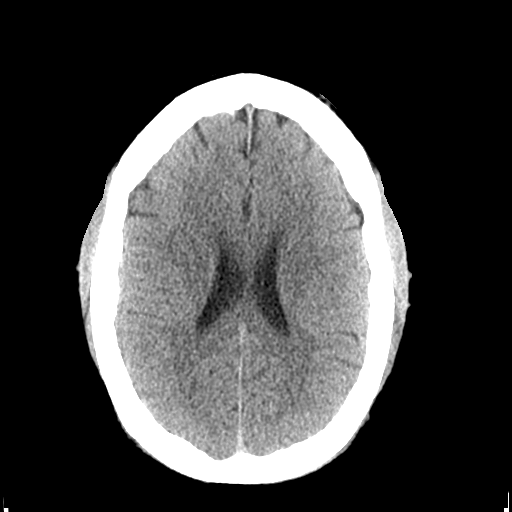
[im 18/31  brain]
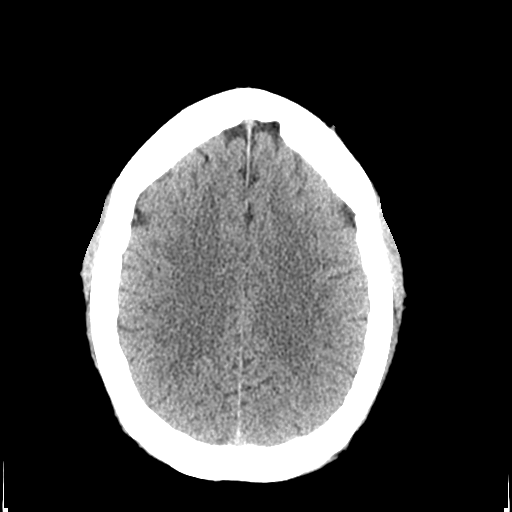
[im 20/31  brain]
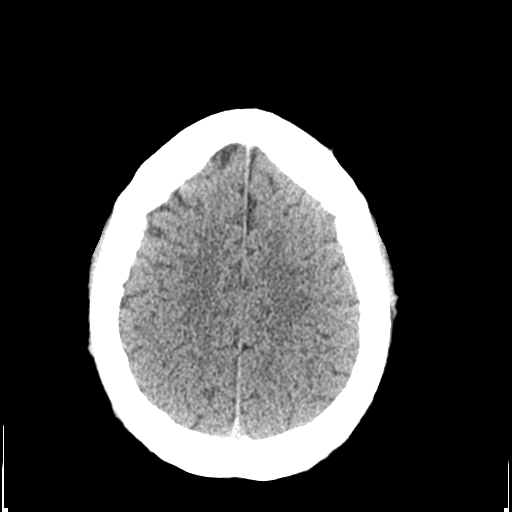
[im 20/31  bone]
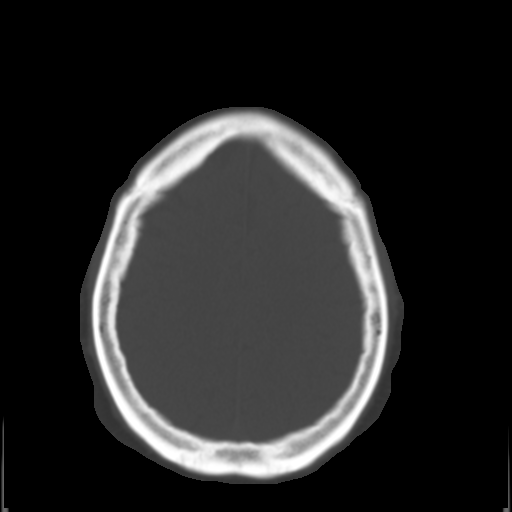
[im 22/31  brain]
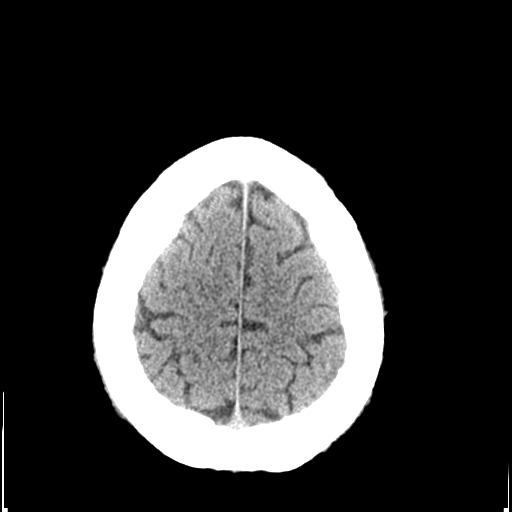
[im 24/31  brain]
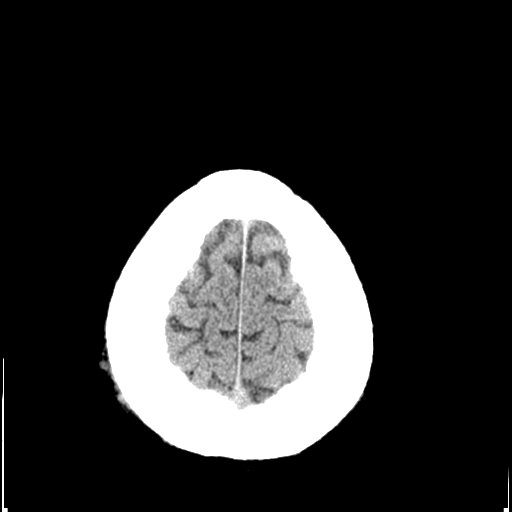
[im 26/31  brain]
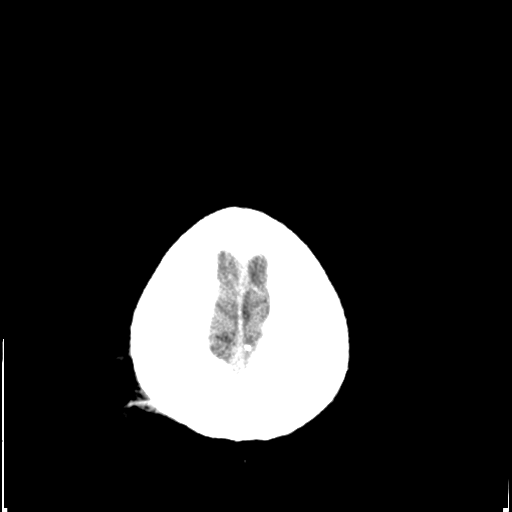
[im 28/31  brain]
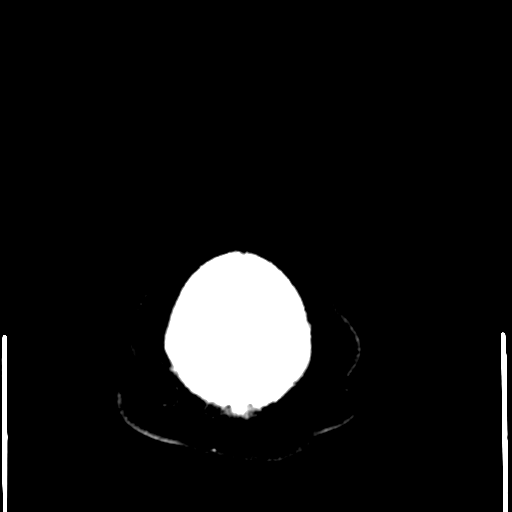
[im 28/31  bone]
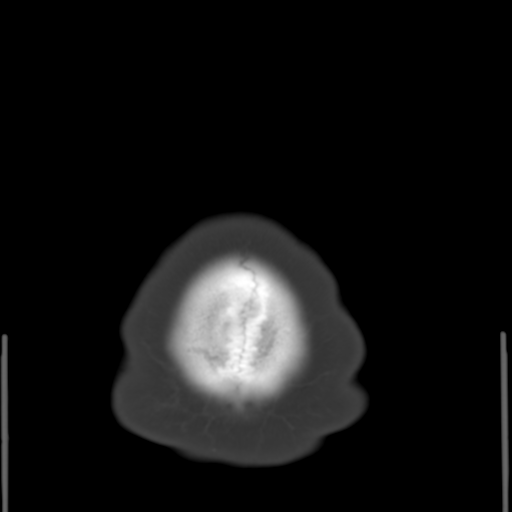

[Series 3: bone windows · axial · 0.45mm/px · z∈[-204,-164]mm · 3 of 31 slices shown]
[im 3/31  bone]
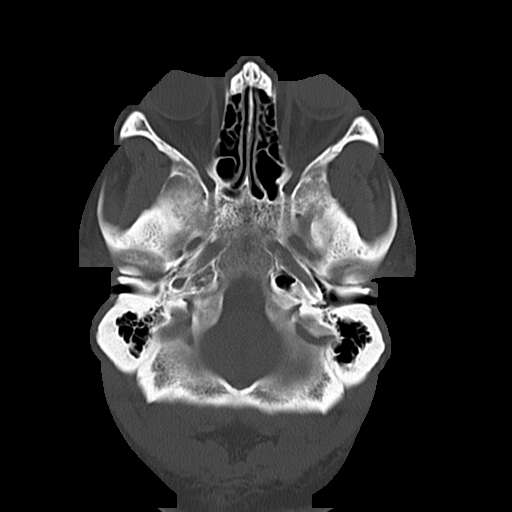
[im 7/31  bone]
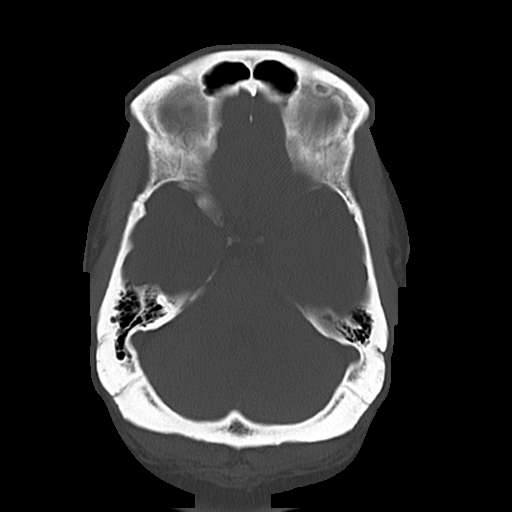
[im 11/31  bone]
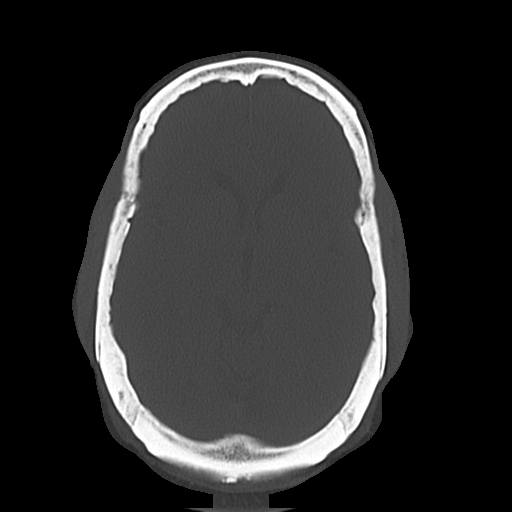

[16 of 30 positions shown; findings below may reference images not displayed]

FINDINGS: No evidence for acute infarction, hemorrhage, mass lesion,
hydrocephalus, or extra-axial fluid. Normal cerebral volume. No
white matter disease. Calvarium intact. No sinus or mastoid disease.
Dense opacification of the LEFT globe, likely chronic vitreous
hemorrhage with tiny posterior globe calcification, stable from
priors.
IMPRESSION: No acute intracranial findings. Chronic LEFT eye vitreous
abnormality likely resulting in blindness.

## 2017-05-05 DIAGNOSIS — F418 Other specified anxiety disorders: Secondary | ICD-10-CM | POA: Diagnosis not present

## 2017-05-05 DIAGNOSIS — E1165 Type 2 diabetes mellitus with hyperglycemia: Secondary | ICD-10-CM | POA: Diagnosis not present

## 2017-05-05 DIAGNOSIS — J45909 Unspecified asthma, uncomplicated: Secondary | ICD-10-CM | POA: Diagnosis not present

## 2017-05-05 DIAGNOSIS — E538 Deficiency of other specified B group vitamins: Secondary | ICD-10-CM | POA: Diagnosis not present

## 2017-05-05 DIAGNOSIS — R1013 Epigastric pain: Secondary | ICD-10-CM | POA: Diagnosis not present

## 2017-05-05 DIAGNOSIS — R21 Rash and other nonspecific skin eruption: Secondary | ICD-10-CM | POA: Diagnosis not present

## 2017-05-05 DIAGNOSIS — M545 Low back pain: Secondary | ICD-10-CM | POA: Diagnosis not present

## 2017-05-09 IMAGING — CR DG FOOT COMPLETE 3+V*R*
3 series · 3 of 3 positions shown · non-contrast
Comparison: 09/30/14

CLINICAL DATA: Fall from shopping [place] yesterday with right foot
pain, initial encounter

EXAM:
RIGHT FOOT COMPLETE - 3+ VIEW

[x foot ap right]
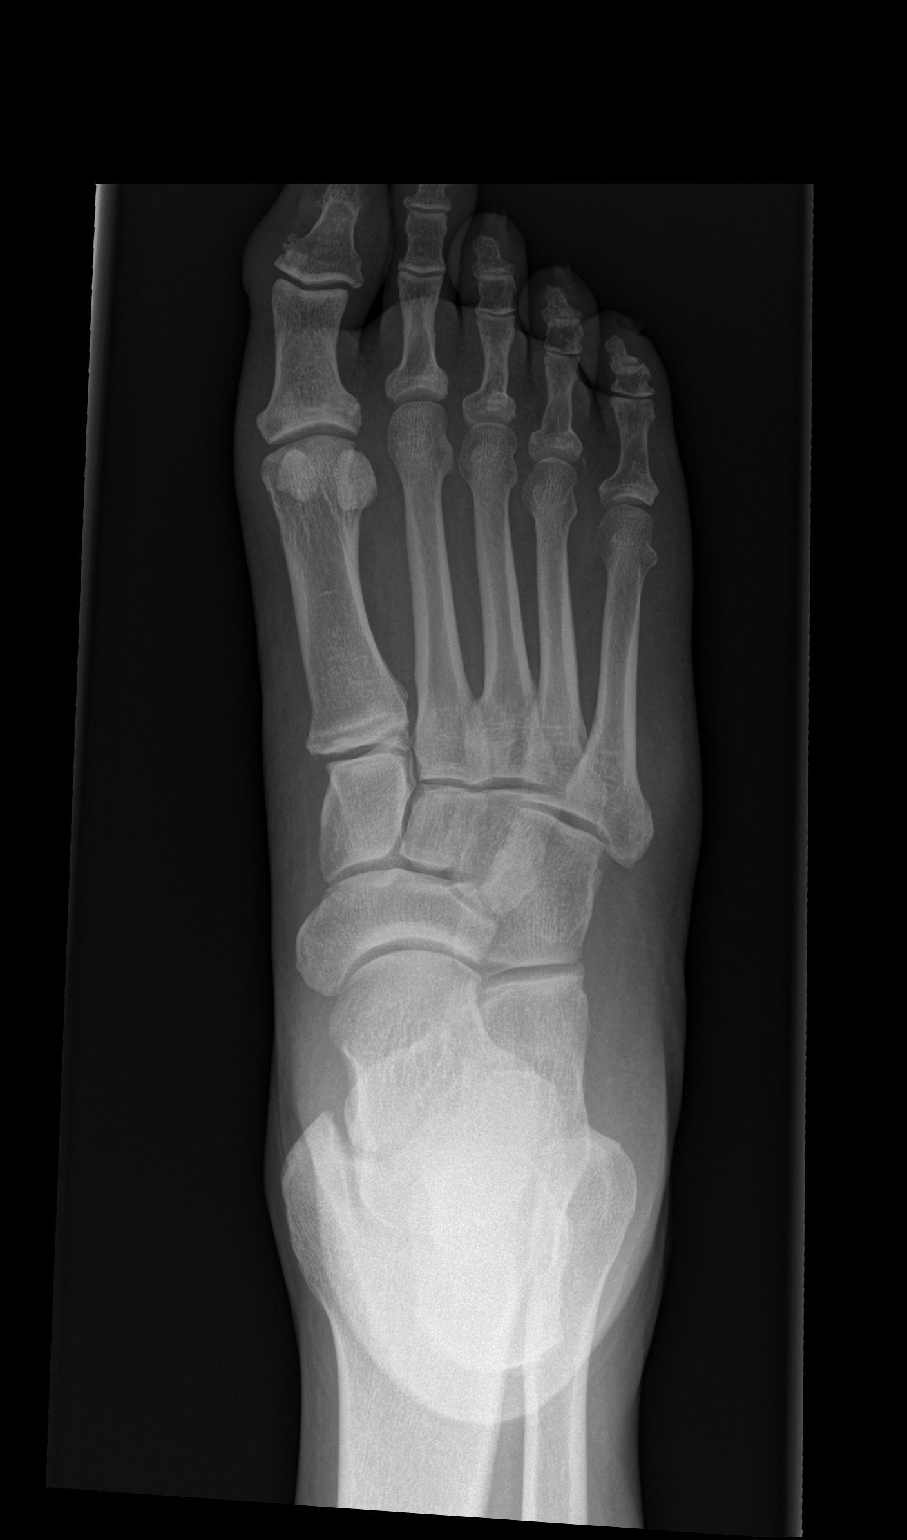

[x foot obl right]
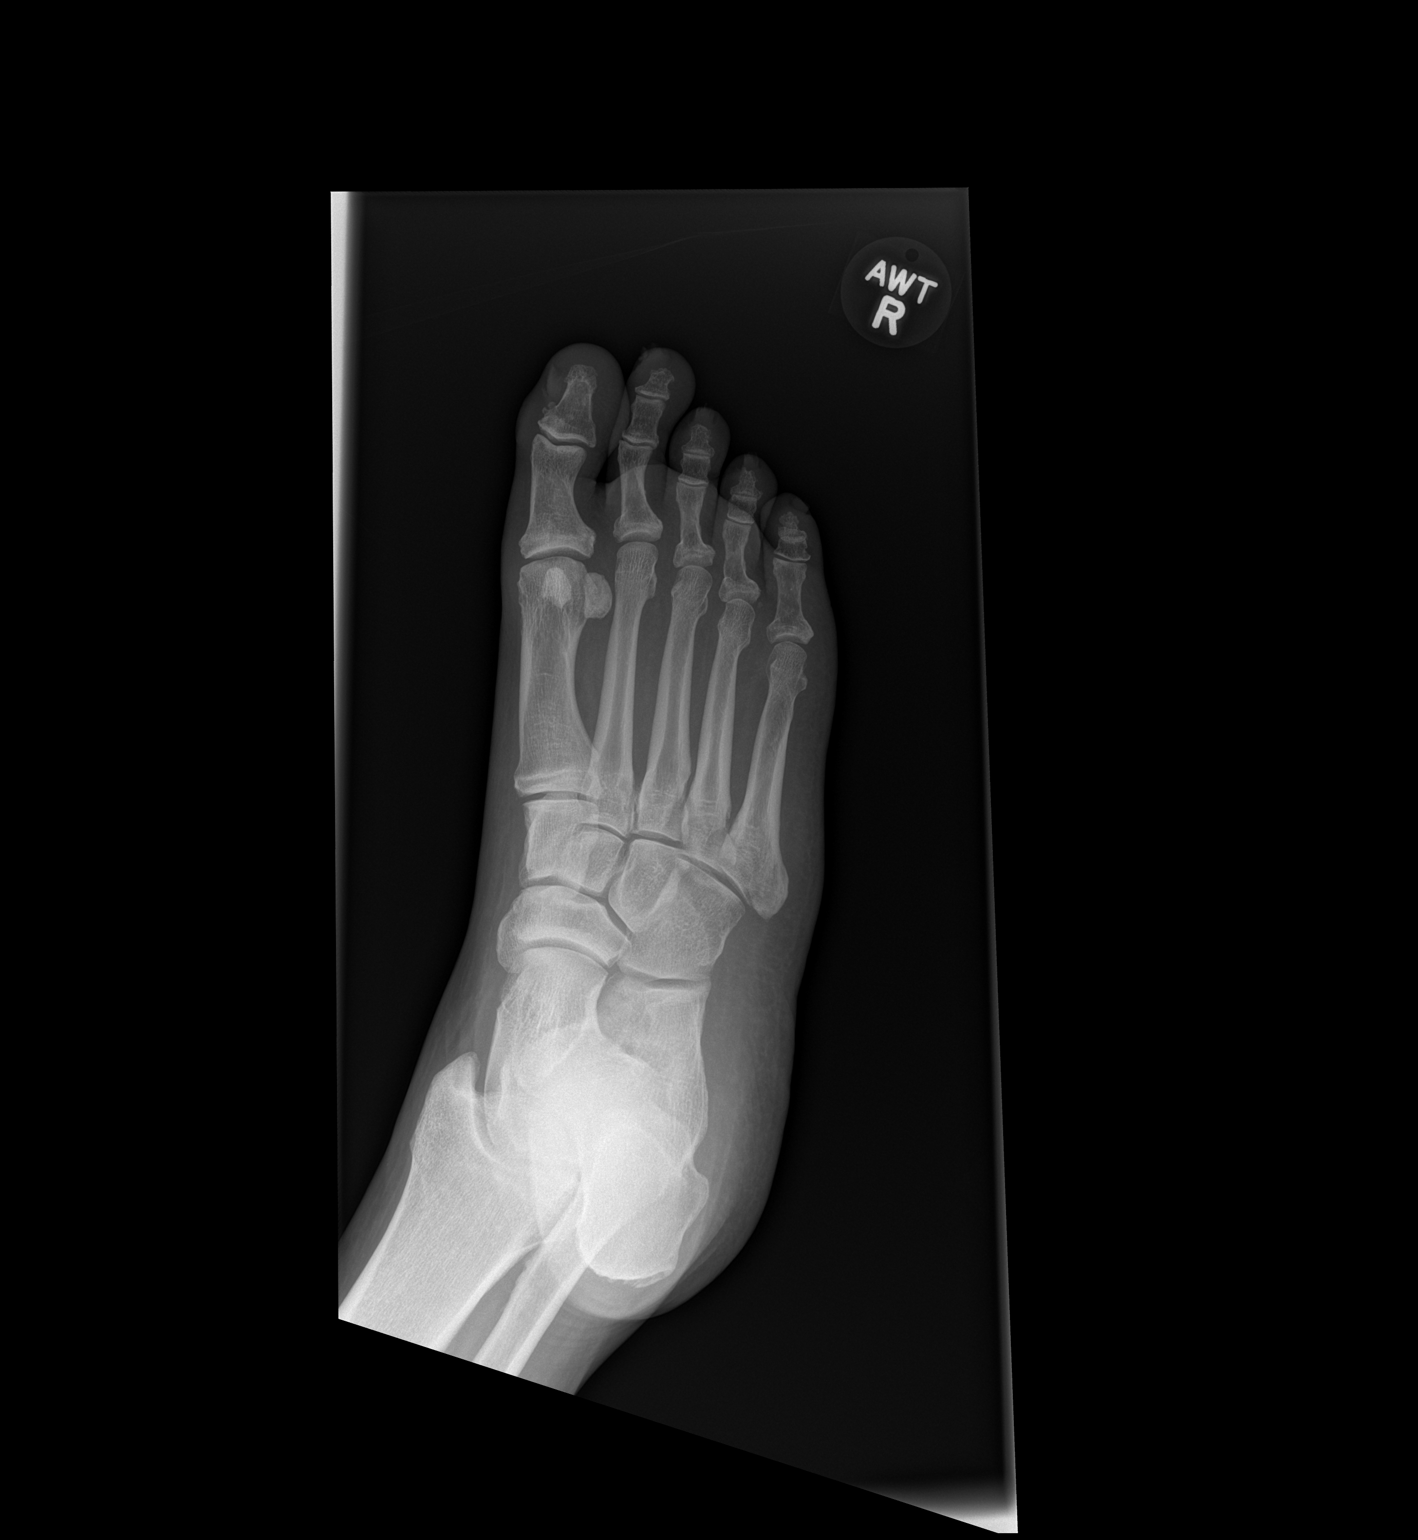

[x foot lat right]
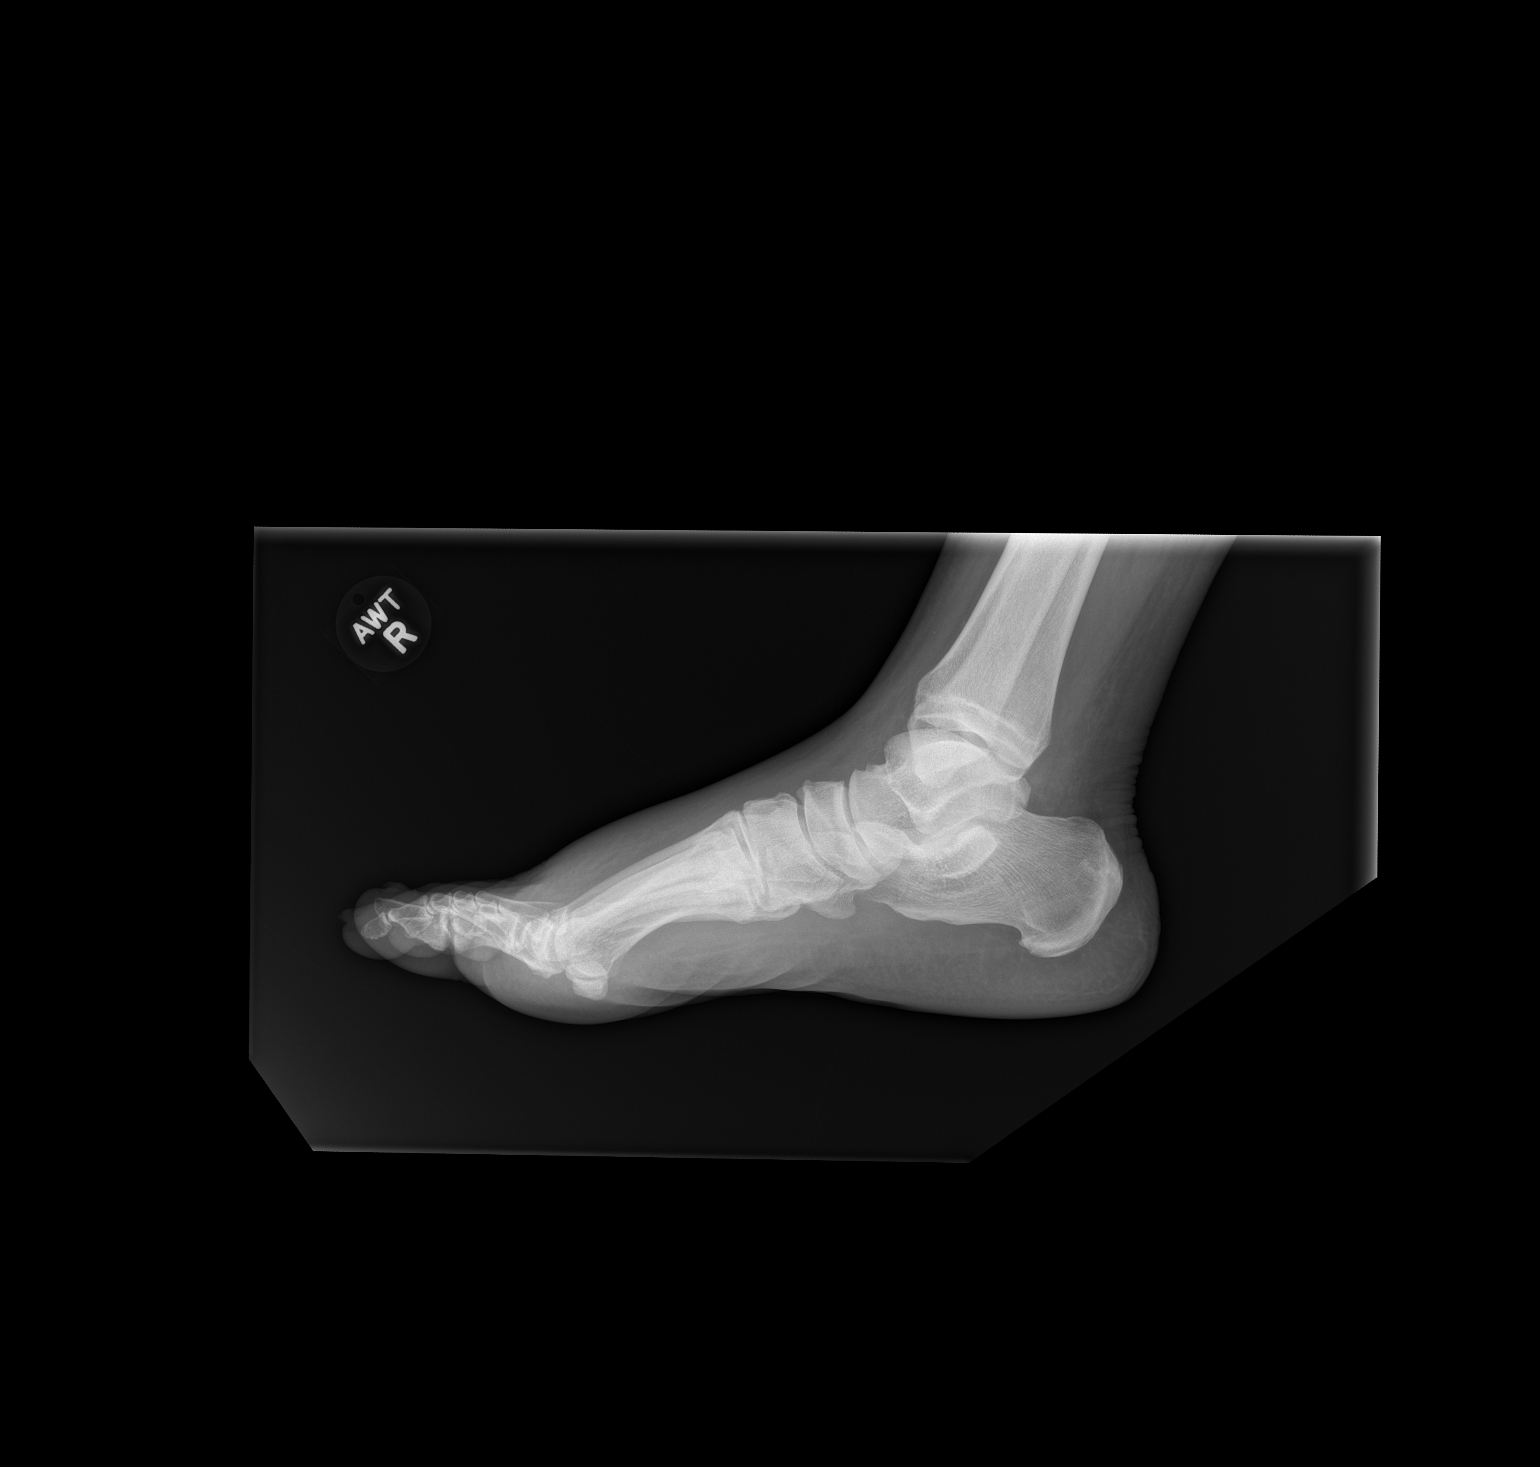

[3 of 3 positions shown; findings below may reference images not displayed]

FINDINGS: There is again mild cortical irregularity at the base of the fifth
metatarsal. No other metatarsal abnormality is seen. No other
fractures are noted. Mild soft tissue swelling is noted along the
metatarsal heads.
IMPRESSION: Stable changes at the base of the fifth metatarsal.

Soft tissue swelling

## 2017-05-16 DIAGNOSIS — M79672 Pain in left foot: Secondary | ICD-10-CM | POA: Diagnosis not present

## 2017-05-16 DIAGNOSIS — M21961 Unspecified acquired deformity of right lower leg: Secondary | ICD-10-CM | POA: Diagnosis not present

## 2017-05-16 DIAGNOSIS — L84 Corns and callosities: Secondary | ICD-10-CM | POA: Diagnosis not present

## 2017-05-16 DIAGNOSIS — M21962 Unspecified acquired deformity of left lower leg: Secondary | ICD-10-CM | POA: Diagnosis not present

## 2017-05-16 DIAGNOSIS — B351 Tinea unguium: Secondary | ICD-10-CM | POA: Diagnosis not present

## 2017-05-16 DIAGNOSIS — E1351 Other specified diabetes mellitus with diabetic peripheral angiopathy without gangrene: Secondary | ICD-10-CM | POA: Diagnosis not present

## 2017-05-16 DIAGNOSIS — M79671 Pain in right foot: Secondary | ICD-10-CM | POA: Diagnosis not present

## 2017-05-20 ENCOUNTER — Encounter (HOSPITAL_COMMUNITY): Payer: Self-pay

## 2017-05-20 ENCOUNTER — Other Ambulatory Visit: Payer: Self-pay

## 2017-05-20 ENCOUNTER — Emergency Department (HOSPITAL_COMMUNITY)
Admission: EM | Admit: 2017-05-20 | Discharge: 2017-05-20 | Disposition: A | Discharge status: Home / Self Care | Payer: Medicare Other | Attending: Emergency Medicine | Admitting: Emergency Medicine

## 2017-05-20 ENCOUNTER — Emergency Department (HOSPITAL_COMMUNITY): Payer: Medicare Other

## 2017-05-20 DIAGNOSIS — Z79899 Other long term (current) drug therapy: Secondary | ICD-10-CM | POA: Insufficient documentation

## 2017-05-20 DIAGNOSIS — R0602 Shortness of breath: Secondary | ICD-10-CM | POA: Diagnosis not present

## 2017-05-20 DIAGNOSIS — J45909 Unspecified asthma, uncomplicated: Secondary | ICD-10-CM | POA: Insufficient documentation

## 2017-05-20 DIAGNOSIS — M545 Low back pain, unspecified: Secondary | ICD-10-CM

## 2017-05-20 DIAGNOSIS — F17210 Nicotine dependence, cigarettes, uncomplicated: Secondary | ICD-10-CM | POA: Diagnosis not present

## 2017-05-20 DIAGNOSIS — R3912 Poor urinary stream: Secondary | ICD-10-CM | POA: Diagnosis not present

## 2017-05-20 DIAGNOSIS — R1013 Epigastric pain: Secondary | ICD-10-CM | POA: Insufficient documentation

## 2017-05-20 DIAGNOSIS — R05 Cough: Secondary | ICD-10-CM | POA: Diagnosis not present

## 2017-05-20 DIAGNOSIS — R0789 Other chest pain: Secondary | ICD-10-CM | POA: Diagnosis not present

## 2017-05-20 DIAGNOSIS — E119 Type 2 diabetes mellitus without complications: Secondary | ICD-10-CM | POA: Diagnosis not present

## 2017-05-20 DIAGNOSIS — R5383 Other fatigue: Secondary | ICD-10-CM | POA: Diagnosis not present

## 2017-05-20 DIAGNOSIS — R079 Chest pain, unspecified: Secondary | ICD-10-CM | POA: Diagnosis not present

## 2017-05-20 DIAGNOSIS — R109 Unspecified abdominal pain: Secondary | ICD-10-CM | POA: Diagnosis not present

## 2017-05-20 LAB — URINALYSIS, ROUTINE W REFLEX MICROSCOPIC
Bacteria, UA: NONE SEEN
Bilirubin Urine: NEGATIVE
Glucose, UA: NEGATIVE mg/dL
Hgb urine dipstick: NEGATIVE
Ketones, ur: 20 mg/dL — AB
Nitrite: NEGATIVE
Protein, ur: NEGATIVE mg/dL
Specific Gravity, Urine: 1.028 (ref 1.005–1.030)
pH: 5 (ref 5.0–8.0)

## 2017-05-20 LAB — CBC
HCT: 43.2 % (ref 39.0–52.0)
Hemoglobin: 15.3 g/dL (ref 13.0–17.0)
MCH: 28.2 pg (ref 26.0–34.0)
MCHC: 35.4 g/dL (ref 30.0–36.0)
MCV: 79.7 fL (ref 78.0–100.0)
Platelets: 148 10*3/uL — ABNORMAL LOW (ref 150–400)
RBC: 5.42 MIL/uL (ref 4.22–5.81)
RDW: 13.8 % (ref 11.5–15.5)
WBC: 5 10*3/uL (ref 4.0–10.5)

## 2017-05-20 LAB — I-STAT TROPONIN, ED
Troponin i, poc: 0.01 ng/mL (ref 0.00–0.08)
Troponin i, poc: 0.01 ng/mL (ref 0.00–0.08)

## 2017-05-20 LAB — BASIC METABOLIC PANEL
Anion gap: 10 (ref 5–15)
BUN: 18 mg/dL (ref 6–20)
CO2: 27 mmol/L (ref 22–32)
Calcium: 9.6 mg/dL (ref 8.9–10.3)
Chloride: 105 mmol/L (ref 101–111)
Creatinine, Ser: 1.11 mg/dL (ref 0.61–1.24)
GFR calc Af Amer: >60 mL/min (ref >60)
GFR calc non Af Amer: >60 mL/min (ref >60)
Glucose, Bld: 87 mg/dL (ref 65–99)
Potassium: 4.5 mmol/L (ref 3.5–5.1)
Sodium: 142 mmol/L (ref 135–145)

## 2017-05-20 MED ORDER — GI COCKTAIL ~~LOC~~
30.0000 mL | Freq: Once | ORAL | Status: AC
  Administered 2017-05-20: 30 mL via ORAL
  Filled 2017-05-20: qty 30

## 2017-05-20 MED ORDER — TRAMADOL HCL 50 MG PO TABS: 50.0000 mg | ORAL_TABLET | Freq: Four times a day (QID) | ORAL | 0 refills | Status: DC | PRN

## 2017-05-20 NOTE — ED Notes (Signed)
Pt cannot use restroom at this time, aware urine specimen is needed.  

## 2017-05-20 NOTE — ED Provider Notes (Signed)
[facility] DEPT Provider Note   CSN: [id number] Arrival date & time: 05/20/17  1309     History   Chief Complaint Chief Complaint  Patient presents with  . Back Pain  . Chest Pain    HPI HOMERO HYSON is a 62 y.o. male.  Patient is a 62 year old male with a history of diabetes, reflux, anxiety and asthma who presents with back pain.  He reports a 2 to 64-month history of pain in his left lower back.  He states it is intermittent.  It presents as a sharp stabbing pain which last a minute or so and then goes away.  He states it is triggered by certain movements but it is not reproducible with movements.  And now is radiating around to his left lower abdomen.  He has some difficulty with urination and that he does not have a full stream.  He states that stops and starts.  He denies any nausea or vomiting.  No fevers.  He denies any radiation down his legs.  No numbness or weakness to his legs.  He also reports a 2 to 3-day history of discomfort in his epigastrium.  He had some radiation to his left arm.  Is not exertional pain.  It is sharp at times but currently feels more like an achy feeling.  It is not related to eating.  He has some generalized fatigue and shortness of breath which does not seem to be related to the epigastric pain.  He has had similar episodes in the past and was told that it was stress related.  He denies any known history of heart disease.     Past Medical History:  Diagnosis Date  . Anginal pain ([place])   . Anxiety   . Asthma   . Depression   . Diabetes mellitus without complication ([place])   . Dysrhythmia    irregular heartbeat due to stress  . GERD (gastroesophageal reflux disease)   . Heart murmur    born with heart murmur  . Pneumonia     Patient Active Problem List   Diagnosis Date Noted  . ARF (acute renal failure) ([place]) 09/28/2014  . Abdominal wall cellulitis 09/27/2014  . Cellulitis of abdominal wall 09/27/2014  .  S/P cholecystectomy 09/27/2014  . Chronic cholecystitis 09/16/2014  . Preoperative clearance 08/29/2014  . Type 2 diabetes mellitus without complication ([place]) 84/69/6295  . Type 2 diabetes mellitus without complications ([place]) 28/41/3244  . Morbid obesity ([place]) 07/29/2014  . Colon cancer screening 07/29/2014    Past Surgical History:  Procedure Laterality Date  . CHOLECYSTECTOMY N/A 09/16/2014   Procedure: LAPAROSCOPIC CHOLECYSTECTOMY ;  Surgeon: Arta Bruce Kinsinger, MD;  Location: [facility] ORS;  Service: General;  Laterality: N/A;  . EYE SURGERY    . TESTICLE SURGERY     growth removed  . TONSILLECTOMY          Home Medications    Prior to Admission medications   Medication Sig Start Date End Date Taking? Authorizing Provider  hydrocortisone 2.5 % lotion Apply topically 2 (two) times daily. 03/25/17  Yes Law, Bea Graff, PA-C  meloxicam (MOBIC) 7.5 MG tablet Take 7.5 mg by mouth daily. 05/03/17  Yes [provider]  omeprazole (PRILOSEC) 40 MG capsule Take 40 mg by mouth daily. 05/03/17  Yes [provider]  canagliflozin (INVOKANA) 100 MG TABS tablet Take 1 tablet (100 mg total) by mouth daily. Patient not taking: Reported on 07/08/2015 08/29/14   Angelica Chessman  E, MD  cephALEXin (KEFLEX) 500 MG capsule Take 1 capsule (500 mg total) by mouth 4 (four) times daily. Patient not taking: Reported on 07/29/2015 07/02/15   Palumbo, April, MD  diphenhydrAMINE (BENADRYL) 25 MG tablet Take 1 tablet (25 mg total) by mouth every 6 (six) hours. Patient not taking: Reported on 05/20/2017 03/25/17   Frederica Kuster, PA-C  doxycycline (VIBRAMYCIN) 100 MG capsule Take 1 capsule (100 mg total) by mouth 2 (two) times daily. One po bid x 7 days Patient not taking: Reported on 07/29/2015 07/02/15   Palumbo, April, MD  glipiZIDE (GLUCOTROL XL) 5 MG 24 hr tablet Take 1 tablet (5 mg total) by mouth daily with breakfast. Patient not taking: Reported on 07/08/2015 08/02/14   Tresa Garter, MD    glucose blood (ACCU-CHEK SMARTVIEW) test strip USE AS INSTRUCTED FOR 3 TIMES DAILY TESTING OF BLOOD GLUCOSE. E11.9 01/14/16   Tresa Garter, MD  HYDROcodone-acetaminophen (NORCO/VICODIN) 5-325 MG per tablet Take 1 tablet by mouth every 6 (six) hours as needed for severe pain. Patient not taking: Reported on 07/02/2015 10/07/14   Junius Creamer, NP  ibuprofen (ADVIL,MOTRIN) 600 MG tablet Take 1 tablet (600 mg total) by mouth every 6 (six) hours as needed for moderate pain. Patient not taking: Reported on 07/02/2015 10/07/14   Junius Creamer, NP  metFORMIN (GLUCOPHAGE) 1000 MG tablet Take 1 tablet (1,000 mg total) by mouth 2 (two) times daily with a meal. Patient not taking: Reported on 07/02/2015 09/30/14   Debbe Odea, MD  methocarbamol (ROBAXIN) 500 MG tablet Take 1 tablet (500 mg total) by mouth 2 (two) times daily. Patient not taking: Reported on 06/09/2016 07/29/15   Pixie Casino, MD  naproxen (NAPROSYN) 375 MG tablet Take 1 tablet (375 mg total) by mouth 2 (two) times daily. Patient not taking: Reported on 07/08/2015 07/02/15   Palumbo, April, MD  oxyCODONE-acetaminophen (PERCOCET/ROXICET) 5-325 MG tablet Take 1 tablet by mouth every 4 (four) hours as needed for severe pain. Patient not taking: Reported on 07/29/2015 07/08/15   Charlann Lange, PA-C  traMADol (ULTRAM) 50 MG tablet Take 1 tablet (50 mg total) by mouth every 6 (six) hours as needed. 05/20/17   Malvin Johns, MD    Family History Family History  Problem Relation Age of Onset  . Hypertension Mother   . Heart disease Father   . Cancer Father     Social History Social History   Tobacco Use  . Smoking status: Current Every Day Smoker    Packs/day: 1.00    Types: Cigarettes  . Smokeless tobacco: Never Used  Substance Use Topics  . Alcohol use: No    Frequency: Never    Comment: former   . Drug use: No     Allergies   Bee venom   Review of Systems Review of Systems  Constitutional: Positive for fatigue. Negative  for chills, diaphoresis and fever.  HENT: Negative for congestion, rhinorrhea and sneezing.   Eyes: Negative.   Respiratory: Positive for shortness of breath. Negative for cough and chest tightness.   Cardiovascular: Negative for chest pain and leg swelling.  Gastrointestinal: Positive for abdominal pain. Negative for blood in stool, diarrhea, nausea and vomiting.  Genitourinary: Negative for difficulty urinating, flank pain, frequency and hematuria.  Musculoskeletal: Positive for back pain. Negative for arthralgias.  Skin: Negative for rash.  Neurological: Negative for dizziness, speech difficulty, weakness, numbness and headaches.     Physical Exam Updated Vital Signs BP (!) 139/93   Pulse Marland Kitchen)  55   Temp 98.1 F (36.7 C) (Oral)   Resp 18   SpO2 99%   Physical Exam  Constitutional: He is oriented to person, place, and time. He appears well-developed and well-nourished.  HENT:  Head: Normocephalic and atraumatic.  Eyes: Pupils are equal, round, and reactive to light.  Neck: Normal range of motion. Neck supple.  Cardiovascular: Normal rate, regular rhythm and normal heart sounds.  Pulmonary/Chest: Effort normal and breath sounds normal. No respiratory distress. He has no wheezes. He has no rales. He exhibits no tenderness.  Abdominal: Soft. Bowel sounds are normal. There is no tenderness. There is no rebound and no guarding.  Mild tenderness in the left lower abdomen  Musculoskeletal: Normal range of motion. He exhibits no edema.  No reproducible tenderness along the spine or the musculature of the lower back.  Lymphadenopathy:    He has no cervical adenopathy.  Neurological: He is alert and oriented to person, place, and time.  Normal motor function and sensation to lower extremities   Skin: Skin is warm and dry. No rash noted.  Psychiatric: He has a normal mood and affect.     ED Treatments / Results  Labs (all labs ordered are listed, but only abnormal results are  displayed) Labs Reviewed  CBC - Abnormal; Notable for the following components:      Result Value   Platelets 148 (*)    All other components within normal limits  URINALYSIS, ROUTINE W REFLEX MICROSCOPIC - Abnormal; Notable for the following components:   APPearance HAZY (*)    Ketones, ur 20 (*)    Leukocytes, UA SMALL (*)    All other components within normal limits  URINE CULTURE  BASIC METABOLIC PANEL  I-STAT TROPONIN, ED  I-STAT TROPONIN, ED    EKG EKG Interpretation  Date/Time:  Friday May 20 2017 16:56:35 EDT Ventricular Rate:  59 PR Interval:    QRS Duration: 144 QT Interval:  468 QTC Calculation: 464 R Axis:   -55 Text Interpretation:  Sinus rhythm Atrial premature complexes RBBB and LAFB Baseline wander in lead(s) V3 since last tracing no significant change Confirmed by Malvin Johns [phone]) on 05/20/2017 5:33:13 PM   Radiology Dg Chest 2 View  Result Date: 05/20/2017 CLINICAL DATA:  Left-sided chest pain with shortness of breath and productive cough. EXAM: CHEST - 2 VIEW COMPARISON:  Chest x-ray dated Jun 09, 2016. FINDINGS: The heart size and mediastinal contours are within normal limits. Normal pulmonary vascularity. No focal consolidation, pleural effusion, or pneumothorax. No acute osseous abnormality. IMPRESSION: No active cardiopulmonary disease. Electronically Signed   By: Titus Dubin M.D.   On: 05/20/2017 15:22   Ct Renal Stone Study  Result Date: 05/20/2017 CLINICAL DATA:  62 year old male with left side back and flank pain radiating to the right side. Fatigue for 2 days. EXAM: CT ABDOMEN AND PELVIS WITHOUT CONTRAST TECHNIQUE: Multidetector CT imaging of the abdomen and pelvis was performed following the standard protocol without IV contrast. COMPARISON:  Chest CTA 09/27/2014. FINDINGS: Lower chest: Mild atelectasis or scarring at the lung bases is stable compared to the 2016 CTA. No pericardial or pleural effusion. Hepatobiliary: Surgically absent  gallbladder. Negative noncontrast liver. Pancreas: Negative. Spleen: Negative. Adrenals/Urinary Tract: Normal adrenal glands aside from mild thickening such as due to adrenal hyperplasia. Negative noncontrast right kidney and proximal right ureter. Negative course of the right ureter. Diminutive and unremarkable urinary bladder. Likewise the noncontrast left kidney appears normal. No nephrolithiasis or hydronephrosis. Normal proximal left  ureter, and negative course of the left ureter. No urologic calculus identified. Stomach/Bowel: Negative rectosigmoid colon. Negative left colon and transverse colon. Mild retained stool from the hepatic flexure through the right colon. No large bowel inflammation. Normal appendix. Negative terminal ileum. No dilated small bowel. Negative stomach and duodenum. No abdominal free air or free fluid. Vascular/Lymphatic: Aortoiliac calcified atherosclerosis. Vascular patency is not evaluated in the absence of IV contrast. No lymphadenopathy. Reproductive: Mild prostate enlargement up to 55 millimeters diameter. Otherwise negative. Other: No pelvic free fluid. Musculoskeletal: Degenerative appearing sclerosis and superior endplate deformity of the L1 vertebral body, probably a large superior endplate Schmorl's node. Widespread endplate osteophytosis. Moderate to severe lower lumbar facet degeneration. No acute osseous abnormality identified. IMPRESSION: 1. No urologic calculus, obstructive uropathy, or acute inflammatory process identified in the noncontrast abdomen or pelvis. 2. Multilevel lumbar spine degeneration. No acute osseous abnormality identified. 3.  Aortic Atherosclerosis (ICD10-I70.0). Electronically Signed   By: Genevie Ann M.D.   On: 05/20/2017 17:02    Procedures Procedures (including critical care time)  Medications Ordered in ED Medications  gi cocktail (Maalox,Lidocaine,Donnatal) (30 mLs Oral Given 05/20/17 1637)     Initial Impression / Assessment and Plan / ED  Course  I have reviewed the triage vital signs and the nursing notes.  Pertinent labs & imaging results that were available during my care of the patient were reviewed by me and considered in my medical decision making (see chart for details).     Patient is a 89-year-old male who presents with left-sided low back pain.  He now has some radiation to his abdomen.  Been going on for several months.  There is no associated sciatica.  No neurologic deficits.  He has minimal abdominal pain associated with it.  I did do a CT scan of his abdomen pelvis which showed no acute abnormalities.  He has not required any medication in the ED.  He did have a second complain of some vague discomfort in his epigastrium.  This did not seem to improved with a GI cocktail but he currently does not have any symptoms.  He has no exertional symptoms or other associated symptoms.  He has had 2- troponins.  He has no ischemic changes on his EKG.  He was instructed to have close follow-up with his PCP.  Return precautions were given.  He was given a prescription for tramadol for pain control.  Final Clinical Impressions(s) / ED Diagnoses   Final diagnoses:  Acute left-sided low back pain without sciatica  Atypical chest pain    ED Discharge Orders        Ordered    traMADol (ULTRAM) 50 MG tablet  Every 6 hours PRN     05/20/17 1917       Malvin Johns, MD 05/20/17 1919

## 2017-05-20 NOTE — ED Triage Notes (Addendum)
L sided back pain x6 months. He is convinced that it is not muscular. He states that it is spreading to his R side. He reports that he has been seen here for it before. He does endorse urinary hesitancy.   Also reports intermittent central chest and epigastric pain and fatigue over the last 2 days.

## 2017-05-20 NOTE — ED Notes (Signed)
Patient given urine specimen cup to obtain sample.

## 2017-05-22 LAB — URINE CULTURE: Culture: <10000 — AB

## 2017-05-23 DIAGNOSIS — B351 Tinea unguium: Secondary | ICD-10-CM | POA: Diagnosis not present

## 2017-05-24 ENCOUNTER — Ambulatory Visit: Payer: Medicare Other | Admitting: Cardiovascular Disease

## 2017-05-25 ENCOUNTER — Encounter: Payer: Self-pay | Admitting: *Deleted

## 2017-05-31 DIAGNOSIS — H35411 Lattice degeneration of retina, right eye: Secondary | ICD-10-CM | POA: Diagnosis not present

## 2017-05-31 DIAGNOSIS — H1712 Central corneal opacity, left eye: Secondary | ICD-10-CM | POA: Diagnosis not present

## 2017-05-31 DIAGNOSIS — H3561 Retinal hemorrhage, right eye: Secondary | ICD-10-CM | POA: Diagnosis not present

## 2017-06-14 ENCOUNTER — Encounter (HOSPITAL_COMMUNITY): Payer: Self-pay

## 2017-06-14 ENCOUNTER — Emergency Department (HOSPITAL_COMMUNITY)
Admission: EM | Admit: 2017-06-14 | Discharge: 2017-06-14 | Disposition: A | Discharge status: Home / Self Care | Payer: Medicare Other | Attending: Emergency Medicine | Admitting: Emergency Medicine

## 2017-06-14 ENCOUNTER — Emergency Department (HOSPITAL_COMMUNITY): Payer: Medicare Other

## 2017-06-14 DIAGNOSIS — Z23 Encounter for immunization: Secondary | ICD-10-CM | POA: Insufficient documentation

## 2017-06-14 DIAGNOSIS — S59912A Unspecified injury of left forearm, initial encounter: Secondary | ICD-10-CM | POA: Diagnosis not present

## 2017-06-14 DIAGNOSIS — J45909 Unspecified asthma, uncomplicated: Secondary | ICD-10-CM | POA: Diagnosis not present

## 2017-06-14 DIAGNOSIS — Y999 Unspecified external cause status: Secondary | ICD-10-CM | POA: Diagnosis not present

## 2017-06-14 DIAGNOSIS — M7989 Other specified soft tissue disorders: Secondary | ICD-10-CM | POA: Diagnosis not present

## 2017-06-14 DIAGNOSIS — E119 Type 2 diabetes mellitus without complications: Secondary | ICD-10-CM | POA: Diagnosis not present

## 2017-06-14 DIAGNOSIS — Z79899 Other long term (current) drug therapy: Secondary | ICD-10-CM | POA: Diagnosis not present

## 2017-06-14 DIAGNOSIS — Y929 Unspecified place or not applicable: Secondary | ICD-10-CM | POA: Diagnosis not present

## 2017-06-14 DIAGNOSIS — T85398D Other mechanical complication of other ocular prosthetic devices, implants and grafts, subsequent encounter: Secondary | ICD-10-CM | POA: Diagnosis not present

## 2017-06-14 DIAGNOSIS — Y939 Activity, unspecified: Secondary | ICD-10-CM | POA: Insufficient documentation

## 2017-06-14 DIAGNOSIS — W231XXA Caught, crushed, jammed, or pinched between stationary objects, initial encounter: Secondary | ICD-10-CM | POA: Diagnosis not present

## 2017-06-14 DIAGNOSIS — F17210 Nicotine dependence, cigarettes, uncomplicated: Secondary | ICD-10-CM | POA: Insufficient documentation

## 2017-06-14 DIAGNOSIS — M79632 Pain in left forearm: Secondary | ICD-10-CM | POA: Diagnosis not present

## 2017-06-14 DIAGNOSIS — H43811 Vitreous degeneration, right eye: Secondary | ICD-10-CM | POA: Diagnosis not present

## 2017-06-14 DIAGNOSIS — H2511 Age-related nuclear cataract, right eye: Secondary | ICD-10-CM | POA: Diagnosis not present

## 2017-06-14 DIAGNOSIS — H31012 Macula scars of posterior pole (postinflammatory) (post-traumatic), left eye: Secondary | ICD-10-CM | POA: Diagnosis not present

## 2017-06-14 MED ORDER — IBUPROFEN 400 MG PO TABS
600.0000 mg | ORAL_TABLET | Freq: Once | ORAL | Status: AC
  Administered 2017-06-14: 600 mg via ORAL
  Filled 2017-06-14: qty 1

## 2017-06-14 MED ORDER — TETANUS-DIPHTH-ACELL PERTUSSIS 5-2.5-18.5 LF-MCG/0.5 IM SUSP
0.5000 mL | Freq: Once | INTRAMUSCULAR | Status: AC
  Administered 2017-06-14: 0.5 mL via INTRAMUSCULAR
  Filled 2017-06-14: qty 0.5

## 2017-06-14 NOTE — ED Triage Notes (Signed)
Pt presents with small abrasions and swelling to L forearm.  Pt reports a door in a facility that he attends daily closed on his arm.

## 2017-06-14 NOTE — Discharge Instructions (Addendum)
Please read and follow all provided instructions.  You have been seen today for an injury to your left forearm.   Tests performed today include: An x-ray of the affected area - does NOT show any broken bones or dislocations.  Vital signs. See below for your results today.   Home care instructions: -- *PRICE in the first 24-48 hours after injury: Protect (with brace, splint, sling), if given by your provider Rest Ice- Do not apply ice pack directly to your skin, place towel or similar between your skin and ice/ice pack. Apply ice for 20 min, then remove for 40 min while awake Compression- Wear brace, elastic bandage, splint as directed by your provider- for comfort.  Elevate affected extremity above the level of your heart when not walking around for the first 24-48 hours   Use Ibuprofen (Motrin/Advil) 600mg  every 6 hours as needed for pain (do not exceed max dose in 24 hours, 2400mg )  Follow-up instructions: Please follow-up with your primary care provider  if you continue to have significant pain in 1 week. In this case you may have a more severe injury that requires further care.   Return instructions:  Please return if your fingers or hand are numb or tingling, appear gray or blue, or you have severe pain (also elevate the hand and loosen splint or wrap if you were given one) Please return to the Emergency Department if you experience worsening symptoms.  Please return if you have any other emergent concerns. Additional Information:  Your vital signs today were: BP 116/80 (BP Location: Right Arm)    Pulse 74    Temp 98.2 F (36.8 C) (Oral)    Resp 18    Ht 6' (1.829 m)    Wt 111.1 kg (245 lb)    SpO2 99%    BMI 33.23 kg/m  If your blood pressure (BP) was elevated above 135/85 this visit, please have this repeated by your doctor within one month. ---------------

## 2017-06-14 NOTE — ED Provider Notes (Signed)
[facility] EMERGENCY DEPARTMENT Provider Note   CSN: [id number] Arrival date & time: 06/14/17  1740     History   Chief Complaint Chief Complaint  Patient presents with  . Arm Pain    HPI Lee Reid is a 62 y.o. male with a hx of anxiety, asthma, DM, and GERD who presents to the ED s/p L arm injury this AM at 11:30. Patient states that his LUE was accidentally struck by a closing door.  The door closed on his arm.  There was a metal piece in the door that caused a cut to the arm. He states he has been having pain and swelling to the left forearm. Rates pain a 10/10 in severity. Pain is worse with movement. Patient has not taken anything for this prior to arrival. Denies numbness, tingling, weakness, or any other areas of injury. Unsure of last tetanus. Patient states he is ambidextrous.   HPI  Past Medical History:  Diagnosis Date  . Anginal pain ([place])   . Anxiety   . Asthma   . Depression   . Diabetes mellitus without complication ([place])   . Dysrhythmia    irregular heartbeat due to stress  . GERD (gastroesophageal reflux disease)   . Heart murmur    born with heart murmur  . Pneumonia     Patient Active Problem List   Diagnosis Date Noted  . ARF (acute renal failure) ([place]) 09/28/2014  . Abdominal wall cellulitis 09/27/2014  . Cellulitis of abdominal wall 09/27/2014  . S/P cholecystectomy 09/27/2014  . Chronic cholecystitis 09/16/2014  . Preoperative clearance 08/29/2014  . Type 2 diabetes mellitus without complication ([place]) 24/40/1027  . Type 2 diabetes mellitus without complications ([place]) 25/36/6440  . Morbid obesity ([place]) 07/29/2014  . Colon cancer screening 07/29/2014    Past Surgical History:  Procedure Laterality Date  . CHOLECYSTECTOMY N/A 09/16/2014   Procedure: LAPAROSCOPIC CHOLECYSTECTOMY ;  Surgeon: Arta Bruce Kinsinger, MD;  Location: [facility];  Service: General;  Laterality: N/A;  . EYE SURGERY    . TESTICLE SURGERY     growth  removed  . TONSILLECTOMY          Home Medications    Prior to Admission medications   Medication Sig Start Date End Date Taking? Authorizing Provider  canagliflozin (INVOKANA) 100 MG TABS tablet Take 1 tablet (100 mg total) by mouth daily. Patient not taking: Reported on 07/08/2015 08/29/14   Tresa Garter, MD  cephALEXin (KEFLEX) 500 MG capsule Take 1 capsule (500 mg total) by mouth 4 (four) times daily. Patient not taking: Reported on 07/29/2015 07/02/15   Palumbo, April, MD  diphenhydrAMINE (BENADRYL) 25 MG tablet Take 1 tablet (25 mg total) by mouth every 6 (six) hours. Patient not taking: Reported on 05/20/2017 03/25/17   Frederica Kuster, PA-C  doxycycline (VIBRAMYCIN) 100 MG capsule Take 1 capsule (100 mg total) by mouth 2 (two) times daily. One po bid x 7 days Patient not taking: Reported on 07/29/2015 07/02/15   Palumbo, April, MD  glipiZIDE (GLUCOTROL XL) 5 MG 24 hr tablet Take 1 tablet (5 mg total) by mouth daily with breakfast. Patient not taking: Reported on 07/08/2015 08/02/14   Tresa Garter, MD  glucose blood (ACCU-CHEK SMARTVIEW) test strip USE AS INSTRUCTED FOR 3 TIMES DAILY TESTING OF BLOOD GLUCOSE. E11.9 01/14/16   Tresa Garter, MD  HYDROcodone-acetaminophen (NORCO/VICODIN) 5-325 MG per tablet Take 1 tablet by mouth every 6 (six) hours as needed for severe  pain. Patient not taking: Reported on 07/02/2015 10/07/14   Junius Creamer, NP  hydrocortisone 2.5 % lotion Apply topically 2 (two) times daily. 03/25/17   Law, Bea Graff, PA-C  ibuprofen (ADVIL,MOTRIN) 600 MG tablet Take 1 tablet (600 mg total) by mouth every 6 (six) hours as needed for moderate pain. Patient not taking: Reported on 07/02/2015 10/07/14   Junius Creamer, NP  meloxicam (MOBIC) 7.5 MG tablet Take 7.5 mg by mouth daily. 05/03/17   [provider]  metFORMIN (GLUCOPHAGE) 1000 MG tablet Take 1 tablet (1,000 mg total) by mouth 2 (two) times daily with a meal. Patient not taking: Reported on  07/02/2015 09/30/14   Debbe Odea, MD  methocarbamol (ROBAXIN) 500 MG tablet Take 1 tablet (500 mg total) by mouth 2 (two) times daily. Patient not taking: Reported on 06/09/2016 07/29/15   Pixie Casino, MD  naproxen (NAPROSYN) 375 MG tablet Take 1 tablet (375 mg total) by mouth 2 (two) times daily. Patient not taking: Reported on 07/08/2015 07/02/15   Palumbo, April, MD  omeprazole (PRILOSEC) 40 MG capsule Take 40 mg by mouth daily. 05/03/17   [provider]  oxyCODONE-acetaminophen (PERCOCET/ROXICET) 5-325 MG tablet Take 1 tablet by mouth every 4 (four) hours as needed for severe pain. Patient not taking: Reported on 07/29/2015 07/08/15   Charlann Lange, PA-C  traMADol (ULTRAM) 50 MG tablet Take 1 tablet (50 mg total) by mouth every 6 (six) hours as needed. 05/20/17   Malvin Johns, MD    Family History Family History  Problem Relation Age of Onset  . Hypertension Mother   . Heart disease Father   . Cancer Father     Social History Social History   Tobacco Use  . Smoking status: Current Every Day Smoker    Packs/day: 1.00    Types: Cigarettes  . Smokeless tobacco: Never Used  Substance Use Topics  . Alcohol use: No    Frequency: Never    Comment: former   . Drug use: No     Allergies   Bee venom   Review of Systems Review of Systems  Constitutional: Negative for chills and fever.  Musculoskeletal:       Positive for LUE swelling/pain  Skin: Positive for wound.  Neurological: Negative for weakness and numbness.     Physical Exam Updated Vital Signs BP 116/80 (BP Location: Right Arm)   Pulse 74   Temp 98.2 F (36.8 C) (Oral)   Resp 18   Ht 6' (1.829 m)   Wt 111.1 kg (245 lb)   SpO2 99%   BMI 33.23 kg/m   Physical Exam  Constitutional: He appears well-developed and well-nourished. No distress.  HENT:  Head: Normocephalic and atraumatic.  Eyes: Conjunctivae are normal. Right eye exhibits no discharge. Left eye exhibits no discharge.    Musculoskeletal:  Upper extremities: There is a small abrasion to the dorsum of the distal left forearm with some underlying soft tissue swelling.  This area is tender to palpation.  Patient has normal range of motion to bilateral shoulders, elbows, wrists, and all digits.  He has some discomfort with left wrist extension.  His upper extremities are otherwise nontender.  He is neurovascularly intact distally.  Neurological: He is alert.  Clear speech.  5 out of 5 symmetric grip strength.  Sensation grossly intact to bilateral upper extremities.  Patient is able to perform okay sign, thumbs up, and cross second and third digits with both hands.  Psychiatric: He has a normal mood  and affect. His behavior is normal. Thought content normal.  Nursing note and vitals reviewed.    ED Treatments / Results  Labs (all labs ordered are listed, but only abnormal results are displayed) Labs Reviewed - No data to display  EKG None  Radiology Dg Forearm Left  Result Date: 06/14/2017 CLINICAL DATA:  Initial evaluation for acute pain status post trauma. EXAM: LEFT FOREARM - 2 VIEW COMPARISON:  None. FINDINGS: There is no evidence of fracture or other focal bone lesions. Soft tissues are unremarkable. IMPRESSION: Negative. Electronically Signed   By: Jeannine Boga M.D.   On: 06/14/2017 19:31    Procedures Procedures (including critical care time)  Medications Ordered in ED Medications  Tdap (BOOSTRIX) injection 0.5 mL (has no administration in time range)  ibuprofen (ADVIL,MOTRIN) tablet 600 mg (has no administration in time range)    Initial Impression / Assessment and Plan / ED Course  I have reviewed the triage vital signs and the nursing notes.  Pertinent labs & imaging results that were available during my care of the patient were reviewed by me and considered in my medical decision making (see chart for details).   Patient presents status post left forearm injury earlier today.   Patient nontoxic-appearing, in no apparent distress, vitals WNL.  Patient does have a superficial abrasion with underlying swelling to the dorsum of the left distal forearm. X-ray obtained per triage negative for fractures/dislocations. No other areas of injury. Patient NVI distally. Tetanus updated. Given ibuprofen and wrist splint for comfort. Recommended PRICE with tylenol/motrin for pain. I discussed results, treatment plan, need for PCP follow-up, and return precautions with the patient. Provided opportunity for questions, patient confirmed understanding and is in agreement with plan.   Final Clinical Impressions(s) / ED Diagnoses   Final diagnoses:  Forearm injury, left, initial encounter    ED Discharge Orders    None       Amaryllis Dyke, PA-C 06/15/17 0200    Tegeler, Gwenyth Allegra, MD 06/15/17 [phone]

## 2017-06-15 DIAGNOSIS — M79602 Pain in left arm: Secondary | ICD-10-CM | POA: Diagnosis not present

## 2017-06-15 DIAGNOSIS — E1165 Type 2 diabetes mellitus with hyperglycemia: Secondary | ICD-10-CM | POA: Diagnosis not present

## 2017-06-15 DIAGNOSIS — M545 Low back pain: Secondary | ICD-10-CM | POA: Diagnosis not present

## 2017-06-15 DIAGNOSIS — E538 Deficiency of other specified B group vitamins: Secondary | ICD-10-CM | POA: Diagnosis not present

## 2017-06-15 DIAGNOSIS — J45909 Unspecified asthma, uncomplicated: Secondary | ICD-10-CM | POA: Diagnosis not present

## 2017-06-15 DIAGNOSIS — R1013 Epigastric pain: Secondary | ICD-10-CM | POA: Diagnosis not present

## 2017-06-15 DIAGNOSIS — F418 Other specified anxiety disorders: Secondary | ICD-10-CM | POA: Diagnosis not present

## 2017-06-16 DIAGNOSIS — M25522 Pain in left elbow: Secondary | ICD-10-CM | POA: Diagnosis not present

## 2017-06-19 ENCOUNTER — Emergency Department (HOSPITAL_COMMUNITY)
Admission: EM | Admit: 2017-06-19 | Discharge: 2017-06-20 | Disposition: A | Discharge status: Home / Self Care | Payer: Medicare Other | Attending: Emergency Medicine | Admitting: Emergency Medicine

## 2017-06-19 ENCOUNTER — Encounter (HOSPITAL_COMMUNITY): Payer: Self-pay | Admitting: *Deleted

## 2017-06-19 ENCOUNTER — Other Ambulatory Visit: Payer: Self-pay

## 2017-06-19 DIAGNOSIS — M545 Low back pain, unspecified: Secondary | ICD-10-CM

## 2017-06-19 DIAGNOSIS — F17210 Nicotine dependence, cigarettes, uncomplicated: Secondary | ICD-10-CM | POA: Insufficient documentation

## 2017-06-19 DIAGNOSIS — J45909 Unspecified asthma, uncomplicated: Secondary | ICD-10-CM | POA: Diagnosis not present

## 2017-06-19 DIAGNOSIS — E119 Type 2 diabetes mellitus without complications: Secondary | ICD-10-CM | POA: Diagnosis not present

## 2017-06-19 DIAGNOSIS — G8929 Other chronic pain: Secondary | ICD-10-CM | POA: Insufficient documentation

## 2017-06-19 DIAGNOSIS — M5489 Other dorsalgia: Secondary | ICD-10-CM | POA: Diagnosis present

## 2017-06-19 DIAGNOSIS — Z79899 Other long term (current) drug therapy: Secondary | ICD-10-CM | POA: Insufficient documentation

## 2017-06-19 NOTE — ED Triage Notes (Signed)
Pt stated "I haven't been to see the doctor for the back pain.  Can you not do a MRI or [place]?  I had a CT last time I was here.  I was taking gabapentin for my arm and was told it should help with the back pain but it's not."

## 2017-06-19 NOTE — ED Notes (Signed)
Bed: WA07 Expected date:  Expected time:  Means of arrival:  Comments: 

## 2017-06-20 ENCOUNTER — Other Ambulatory Visit: Payer: Self-pay

## 2017-06-20 ENCOUNTER — Emergency Department (HOSPITAL_COMMUNITY): Payer: Medicare Other

## 2017-06-20 ENCOUNTER — Encounter (HOSPITAL_COMMUNITY): Payer: Self-pay | Admitting: Emergency Medicine

## 2017-06-20 DIAGNOSIS — M545 Low back pain: Secondary | ICD-10-CM | POA: Diagnosis not present

## 2017-06-20 LAB — URINALYSIS, ROUTINE W REFLEX MICROSCOPIC
Bilirubin Urine: NEGATIVE
Glucose, UA: NEGATIVE mg/dL
Hgb urine dipstick: NEGATIVE
Ketones, ur: NEGATIVE mg/dL
Leukocytes, UA: NEGATIVE
Nitrite: NEGATIVE
Protein, ur: NEGATIVE mg/dL
Specific Gravity, Urine: 1.014 (ref 1.005–1.030)
pH: 5 (ref 5.0–8.0)

## 2017-06-20 MED ORDER — OXYCODONE-ACETAMINOPHEN 5-325 MG PO TABS
1.0000 | ORAL_TABLET | Freq: Once | ORAL | Status: AC
  Administered 2017-06-20: 1 via ORAL
  Filled 2017-06-20: qty 1

## 2017-06-20 MED ORDER — MELOXICAM 7.5 MG PO TABS: 7.5000 mg | ORAL_TABLET | Freq: Every day | ORAL | 0 refills | Status: DC

## 2017-06-20 MED ORDER — KETOROLAC TROMETHAMINE 30 MG/ML IJ SOLN
30.0000 mg | Freq: Once | INTRAMUSCULAR | Status: AC
  Administered 2017-06-20: 30 mg via INTRAMUSCULAR
  Filled 2017-06-20: qty 1

## 2017-06-20 MED ORDER — CYCLOBENZAPRINE HCL 10 MG PO TABS: 10.0000 mg | ORAL_TABLET | Freq: Three times a day (TID) | ORAL | 0 refills | Status: DC | PRN

## 2017-06-20 NOTE — ED Provider Notes (Signed)
[facility] DEPT Provider Note   CSN: [id number] Arrival date & time: 06/19/17  2111     History   Chief Complaint Chief Complaint  Patient presents with  . Back Pain    left lower    HPI Lee Reid is a 62 y.o. male.  Presents to the emergency department for evaluation of back pain.  He reports that this has been going on for at least a month.  Pain comes and goes.  It is mostly on the left side "over my kidney".  He has not had any hematuria or dysuria.  He reports that the pain worsens with certain positions, bending and twisting.  He denies any direct injury.  He has felt to go into his leg "once or twice", but not consistently.     Past Medical History:  Diagnosis Date  . Anginal pain ([place])   . Anxiety   . Asthma   . Depression   . Diabetes mellitus without complication ([place])   . Dysrhythmia    irregular heartbeat due to stress  . GERD (gastroesophageal reflux disease)   . Heart murmur    born with heart murmur  . Pneumonia     Patient Active Problem List   Diagnosis Date Noted  . ARF (acute renal failure) ([place]) 09/28/2014  . Abdominal wall cellulitis 09/27/2014  . Cellulitis of abdominal wall 09/27/2014  . S/P cholecystectomy 09/27/2014  . Chronic cholecystitis 09/16/2014  . Preoperative clearance 08/29/2014  . Type 2 diabetes mellitus without complication ([place]) 07/14/9483  . Type 2 diabetes mellitus without complications ([place]) 46/27/0350  . Morbid obesity ([place]) 07/29/2014  . Colon cancer screening 07/29/2014    Past Surgical History:  Procedure Laterality Date  . CHOLECYSTECTOMY N/A 09/16/2014   Procedure: LAPAROSCOPIC CHOLECYSTECTOMY ;  Surgeon: Arta Bruce Kinsinger, MD;  Location: [facility];  Service: General;  Laterality: N/A;  . EYE SURGERY    . TESTICLE SURGERY     growth removed  . TONSILLECTOMY          Home Medications    Prior to Admission medications   Medication Sig Start Date End Date Taking?  Authorizing Provider  glucose blood (ACCU-CHEK SMARTVIEW) test strip USE AS INSTRUCTED FOR 3 TIMES DAILY TESTING OF BLOOD GLUCOSE. E11.9 01/14/16   Angelica Chessman E, MD  hydrocortisone 2.5 % lotion Apply topically 2 (two) times daily. 03/25/17   Law, Bea Graff, PA-C  meloxicam (MOBIC) 7.5 MG tablet Take 7.5 mg by mouth daily. 05/03/17   [provider]  omeprazole (PRILOSEC) 40 MG capsule Take 40 mg by mouth daily. 05/03/17   [provider]  traMADol (ULTRAM) 50 MG tablet Take 1 tablet (50 mg total) by mouth every 6 (six) hours as needed. 05/20/17   Malvin Johns, MD    Family History Family History  Problem Relation Age of Onset  . Hypertension Mother   . Heart disease Father   . Cancer Father     Social History Social History   Tobacco Use  . Smoking status: Current Every Day Smoker    Packs/day: 1.00    Types: Cigarettes  . Smokeless tobacco: Never Used  Substance Use Topics  . Alcohol use: No    Frequency: Never    Comment: former   . Drug use: No     Allergies   Bee venom   Review of Systems Review of Systems  Musculoskeletal: Positive for back pain.  All other systems reviewed and are negative.  Physical Exam Updated Vital Signs BP 124/89 (BP Location: Right Arm)   Pulse 64   Temp 98.2 F (36.8 C) (Oral)   Resp 18   Ht 6' (1.829 m)   Wt 111.1 kg (245 lb)   SpO2 98%   BMI 33.23 kg/m   Physical Exam  Constitutional: He is oriented to person, place, and time. He appears well-developed and well-nourished. No distress.  HENT:  Head: Normocephalic and atraumatic.  Right Ear: Hearing normal.  Left Ear: Hearing normal.  Nose: Nose normal.  Mouth/Throat: Oropharynx is clear and moist and mucous membranes are normal.  Eyes: Pupils are equal, round, and reactive to light. Conjunctivae and EOM are normal.  Neck: Normal range of motion. Neck supple.  Cardiovascular: Regular rhythm, S1 normal and S2 normal. Exam reveals no gallop and no  friction rub.  No murmur heard. Pulmonary/Chest: Effort normal and breath sounds normal. No respiratory distress. He exhibits no tenderness.  Abdominal: Soft. Normal appearance and bowel sounds are normal. There is no hepatosplenomegaly. There is no tenderness. There is no rebound, no guarding, no tenderness at McBurney's point and negative Murphy's sign. No hernia.  Musculoskeletal: Normal range of motion.       Lumbar back: He exhibits tenderness.       Back:  Neurological: He is alert and oriented to person, place, and time. He has normal strength. No cranial nerve deficit or sensory deficit. Coordination normal. GCS eye subscore is 4. GCS verbal subscore is 5. GCS motor subscore is 6.  Skin: Skin is warm, dry and intact. No rash noted. No cyanosis.  Psychiatric: He has a normal mood and affect. His speech is normal and behavior is normal. Thought content normal.  Nursing note and vitals reviewed.    ED Treatments / Results  Labs (all labs ordered are listed, but only abnormal results are displayed) Labs Reviewed  URINALYSIS, ROUTINE W REFLEX MICROSCOPIC    EKG None  Radiology Dg Lumbar Spine Complete  Result Date: 06/20/2017 CLINICAL DATA:  Lumbosacral back pain.  No known injury. EXAM: LUMBAR SPINE - COMPLETE 4+ VIEW COMPARISON:  Reformats from abdominal CT 05/20/2017 FINDINGS: No acute fracture. Loss of height of L1 superior endplate with sclerosis is unchanged from prior exam, may be remote compression deformity or large Schmorl's node. Remaining vertebral body heights are preserved. Straightening of normal lordosis without listhesis. Disc space narrowing at L2-L3. Multilevel endplate spurring. Facet arthropathy most prominent in the lower lumbar spine. Sacroiliac joints are congruent. IMPRESSION: Degenerative change in the lumbar spine with degenerative disc disease and facet arthropathy, similar to CT last month. No acute osseous abnormalities. Electronically Signed   By: Jeb Levering M.D.   On: 06/20/2017 01:53    Procedures Procedures (including critical care time)  Medications Ordered in ED Medications - No data to display   Initial Impression / Assessment and Plan / ED Course  I have reviewed the triage vital signs and the nursing notes.  Pertinent labs & imaging results that were available during my care of the patient were reviewed by me and considered in my medical decision making (see chart for details).     Patient presents to the emergency department for evaluation of back pain.  He appears to have history of chronic back pain and has had some worsening over the last month or so.  He has not made an attempt to see a primary care provider.  He does not have any neurologic deficits noted on examination today.  No cauda equina syndrome symptoms.  He does not have any weakness, decreased sensation, saddle anesthesia.  X-ray shows degenerative changes, unchanged from previous CTA.  Final Clinical Impressions(s) / ED Diagnoses   Final diagnoses:  Chronic left-sided low back pain without sciatica    ED Discharge Orders    None       Pollina, Gwenyth Allegra, MD 06/20/17 0157

## 2017-07-12 ENCOUNTER — Other Ambulatory Visit: Payer: Self-pay | Admitting: Internal Medicine

## 2017-07-15 ENCOUNTER — Ambulatory Visit (INDEPENDENT_AMBULATORY_CARE_PROVIDER_SITE_OTHER): Payer: Medicare Other | Admitting: Cardiovascular Disease

## 2017-07-15 ENCOUNTER — Encounter: Payer: Self-pay | Admitting: Cardiovascular Disease

## 2017-07-15 VITALS — BP 118/80 | HR 83 | Ht 72.0 in | Wt 249.0 lb

## 2017-07-15 DIAGNOSIS — I739 Peripheral vascular disease, unspecified: Secondary | ICD-10-CM

## 2017-07-15 NOTE — Patient Instructions (Signed)
Medication Instructions:   NO CHANGE  Testing/Procedures:  Your physician has requested that you have an ankle brachial index (ABI). During this test an ultrasound and blood pressure cuff are used to evaluate the arteries that supply the arms and legs with blood. Allow thirty minutes for this exam. There are no restrictions or special instructions.    Follow-Up:  Your physician recommends that you schedule a follow-up appointment in: AS NEEDED PENDING TEST RESULTS

## 2017-07-15 NOTE — Progress Notes (Signed)
07/15/2017 [name]   12-04-1955  [id number]  Primary Physician Patient, No Pcp Per Primary Cardiologist: Lorretta Harp MD Lupe Carney, [place]  HPI:  Lee Reid is a 62 y.o. moderately overweight divorced African-American male father of 2, grandfather one grandchild referred by Dr. Fritzi Mandes , his podiatrist for peripheral vascular evaluation.  He has no cardiac risk factors.  He is never had a heart attack or stroke.  He did play high school and college football.  He is lost over 150 pounds from diet and exercise and as a result is no longer diabetic.  He apparently has a callus on his foot and was evaluated and I suspect peripheral pulses were not able to be palpated.  He denies claudication.   No outpatient medications have been marked as taking for the 07/15/17 encounter (Office Visit) with Lorretta Harp, MD.     Allergies  Allergen Reactions  . Bee Venom     Social History   Socioeconomic History  . Marital status: Divorced    Spouse name: Not on file  . Number of children: Not on file  . Years of education: Not on file  . Highest education level: Not on file  Occupational History  . Not on file  Social Needs  . Financial resource strain: Not on file  . Food insecurity:    Worry: Not on file    Inability: Not on file  . Transportation needs:    Medical: Not on file    Non-medical: Not on file  Tobacco Use  . Smoking status: Current Every Day Smoker    Packs/day: 1.00    Types: Cigarettes  . Smokeless tobacco: Never Used  Substance and Sexual Activity  . Alcohol use: No    Frequency: Never    Comment: former   . Drug use: No  . Sexual activity: Never  Lifestyle  . Physical activity:    Days per week: Not on file    Minutes per session: Not on file  . Stress: Not on file  Relationships  . Social connections:    Talks on phone: Not on file    Gets together: Not on file    Attends religious service: Not on file    Active member of  club or organization: Not on file    Attends meetings of clubs or organizations: Not on file    Relationship status: Not on file  . Intimate partner violence:    Fear of current or ex partner: Not on file    Emotionally abused: Not on file    Physically abused: Not on file    Forced sexual activity: Not on file  Other Topics Concern  . Not on file  Social History Narrative  . Not on file     Review of Systems: General: negative for chills, fever, night sweats or weight changes.  Cardiovascular: negative for chest pain, dyspnea on exertion, edema, orthopnea, palpitations, paroxysmal nocturnal dyspnea or shortness of breath Dermatological: negative for rash Respiratory: negative for cough or wheezing Urologic: negative for hematuria Abdominal: negative for nausea, vomiting, diarrhea, bright red blood per rectum, melena, or hematemesis Neurologic: negative for visual changes, syncope, or dizziness All other systems reviewed and are otherwise negative except as noted above.    Blood pressure 118/80, pulse 83, height 6' (1.829 m), weight 249 lb (112.9 kg).  General appearance: alert and no distress Neck: no adenopathy, no carotid bruit, no JVD, supple, symmetrical, trachea  midline and thyroid not enlarged, symmetric, no tenderness/mass/nodules Lungs: clear to auscultation bilaterally Heart: regular rate and rhythm, S1, S2 normal, no murmur, click, rub or gallop Extremities: extremities normal, atraumatic, no cyanosis or edema Pulses: 2+ and symmetric Skin: Skin color, texture, turgor normal. No rashes or lesions Neurologic: Alert and oriented X 3, normal strength and tone. Normal symmetric reflexes. Normal coordination and gait  EKG not performed today  ASSESSMENT AND PLAN:   Peripheral arterial disease [facility]) Mr. Heinzman was referred to me by Dr. Fritzi Mandes for evaluation of PAD.  He basically has no cardiac risk factors.  He currently has a callus on his foot.  I do not have  documentation but I suspect that he was referred here because peripheral pulses were unable to be palpated.  He denies claudication.  There are no open wounds.  I cannot palpate peripheral pulses on exam today.  I am going to get lower extremity ABIs/segmental pressures to further evaluate and will see him back as needed.      Lorretta Harp MD FACP,FACC,FAHA, [facility] 07/15/2017 11:14 AM

## 2017-07-15 NOTE — Assessment & Plan Note (Signed)
Lee Reid was referred to me by Dr. Fritzi Mandes for evaluation of PAD.  He basically has no cardiac risk factors.  He currently has a callus on his foot.  I do not have documentation but I suspect that he was referred here because peripheral pulses were unable to be palpated.  He denies claudication.  There are no open wounds.  I cannot palpate peripheral pulses on exam today.  I am going to get lower extremity ABIs/segmental pressures to further evaluate and will see him back as needed.

## 2017-07-20 ENCOUNTER — Other Ambulatory Visit: Payer: Self-pay

## 2017-07-20 NOTE — Telephone Encounter (Signed)
Denied RX, pt must re-establish care, hasn't been seen since 2016

## 2017-08-01 DIAGNOSIS — R202 Paresthesia of skin: Secondary | ICD-10-CM | POA: Diagnosis not present

## 2017-08-05 ENCOUNTER — Inpatient Hospital Stay (HOSPITAL_COMMUNITY): Admission: RE | Admit: 2017-08-05 | Payer: Medicare Other | Source: Ambulatory Visit

## 2017-08-29 DIAGNOSIS — E1165 Type 2 diabetes mellitus with hyperglycemia: Secondary | ICD-10-CM | POA: Diagnosis not present

## 2017-08-29 DIAGNOSIS — R300 Dysuria: Secondary | ICD-10-CM | POA: Diagnosis not present

## 2017-08-29 DIAGNOSIS — M545 Low back pain: Secondary | ICD-10-CM | POA: Diagnosis not present

## 2017-08-29 DIAGNOSIS — J45909 Unspecified asthma, uncomplicated: Secondary | ICD-10-CM | POA: Diagnosis not present

## 2017-08-29 DIAGNOSIS — L0293 Carbuncle, unspecified: Secondary | ICD-10-CM | POA: Diagnosis not present

## 2017-08-29 DIAGNOSIS — E538 Deficiency of other specified B group vitamins: Secondary | ICD-10-CM | POA: Diagnosis not present

## 2017-08-29 DIAGNOSIS — R1013 Epigastric pain: Secondary | ICD-10-CM | POA: Diagnosis not present

## 2017-08-29 DIAGNOSIS — F418 Other specified anxiety disorders: Secondary | ICD-10-CM | POA: Diagnosis not present

## 2017-09-07 ENCOUNTER — Encounter (HOSPITAL_COMMUNITY): Payer: Self-pay

## 2017-09-07 ENCOUNTER — Inpatient Hospital Stay (HOSPITAL_COMMUNITY): Admission: RE | Admit: 2017-09-07 | Payer: Medicare Other | Source: Ambulatory Visit

## 2017-09-14 DIAGNOSIS — S32000A Wedge compression fracture of unspecified lumbar vertebra, initial encounter for closed fracture: Secondary | ICD-10-CM | POA: Diagnosis not present

## 2017-09-14 DIAGNOSIS — M5416 Radiculopathy, lumbar region: Secondary | ICD-10-CM | POA: Diagnosis not present

## 2017-09-14 DIAGNOSIS — M545 Low back pain: Secondary | ICD-10-CM | POA: Diagnosis not present

## 2017-09-15 ENCOUNTER — Ambulatory Visit (HOSPITAL_COMMUNITY)
Admission: RE | Admit: 2017-09-15 | Discharge: 2017-09-15 | Disposition: A | Discharge status: Home / Self Care | Payer: Medicare Other | Source: Ambulatory Visit | Attending: Cardiology | Admitting: Cardiology

## 2017-09-15 DIAGNOSIS — I739 Peripheral vascular disease, unspecified: Secondary | ICD-10-CM | POA: Diagnosis not present

## 2017-09-16 ENCOUNTER — Other Ambulatory Visit: Payer: Self-pay | Admitting: Rehabilitation

## 2017-09-16 DIAGNOSIS — M545 Low back pain: Secondary | ICD-10-CM

## 2017-10-10 DIAGNOSIS — R1013 Epigastric pain: Secondary | ICD-10-CM | POA: Diagnosis not present

## 2017-10-10 DIAGNOSIS — E538 Deficiency of other specified B group vitamins: Secondary | ICD-10-CM | POA: Diagnosis not present

## 2017-10-10 DIAGNOSIS — E1165 Type 2 diabetes mellitus with hyperglycemia: Secondary | ICD-10-CM | POA: Diagnosis not present

## 2017-10-10 DIAGNOSIS — L0293 Carbuncle, unspecified: Secondary | ICD-10-CM | POA: Diagnosis not present

## 2017-10-10 DIAGNOSIS — F418 Other specified anxiety disorders: Secondary | ICD-10-CM | POA: Diagnosis not present

## 2017-10-10 DIAGNOSIS — N528 Other male erectile dysfunction: Secondary | ICD-10-CM | POA: Diagnosis not present

## 2017-10-10 DIAGNOSIS — M545 Low back pain: Secondary | ICD-10-CM | POA: Diagnosis not present

## 2017-10-10 DIAGNOSIS — Z125 Encounter for screening for malignant neoplasm of prostate: Secondary | ICD-10-CM | POA: Diagnosis not present

## 2017-10-10 DIAGNOSIS — J45909 Unspecified asthma, uncomplicated: Secondary | ICD-10-CM | POA: Diagnosis not present

## 2017-10-10 DIAGNOSIS — R05 Cough: Secondary | ICD-10-CM | POA: Diagnosis not present

## 2017-11-21 DIAGNOSIS — R1013 Epigastric pain: Secondary | ICD-10-CM | POA: Diagnosis not present

## 2017-12-08 DIAGNOSIS — M545 Low back pain: Secondary | ICD-10-CM | POA: Diagnosis not present

## 2017-12-08 DIAGNOSIS — J45909 Unspecified asthma, uncomplicated: Secondary | ICD-10-CM | POA: Diagnosis not present

## 2017-12-08 DIAGNOSIS — E1165 Type 2 diabetes mellitus with hyperglycemia: Secondary | ICD-10-CM | POA: Diagnosis not present

## 2017-12-08 DIAGNOSIS — Z23 Encounter for immunization: Secondary | ICD-10-CM | POA: Diagnosis not present

## 2017-12-08 DIAGNOSIS — N528 Other male erectile dysfunction: Secondary | ICD-10-CM | POA: Diagnosis not present

## 2017-12-08 DIAGNOSIS — R1013 Epigastric pain: Secondary | ICD-10-CM | POA: Diagnosis not present

## 2017-12-08 DIAGNOSIS — E538 Deficiency of other specified B group vitamins: Secondary | ICD-10-CM | POA: Diagnosis not present

## 2017-12-08 DIAGNOSIS — L0293 Carbuncle, unspecified: Secondary | ICD-10-CM | POA: Diagnosis not present

## 2017-12-08 DIAGNOSIS — F418 Other specified anxiety disorders: Secondary | ICD-10-CM | POA: Diagnosis not present

## 2017-12-27 DIAGNOSIS — N138 Other obstructive and reflux uropathy: Secondary | ICD-10-CM | POA: Diagnosis not present

## 2017-12-27 DIAGNOSIS — R358 Other polyuria: Secondary | ICD-10-CM | POA: Diagnosis not present

## 2017-12-27 DIAGNOSIS — N529 Male erectile dysfunction, unspecified: Secondary | ICD-10-CM | POA: Diagnosis not present

## 2017-12-27 DIAGNOSIS — N401 Enlarged prostate with lower urinary tract symptoms: Secondary | ICD-10-CM | POA: Diagnosis not present

## 2018-02-07 IMAGING — DX DG CHEST 2V
2 series · 2 of 2 positions shown · non-contrast
Comparison: Chest radiograph performed 08/20/2014, and CTA of the
chest performed 09/27/2014

CLINICAL DATA: Acute onset of back pain, radiating to the
extremities. Right leg swelling. Initial encounter.

EXAM:
CHEST  2 VIEW

[chest pa]
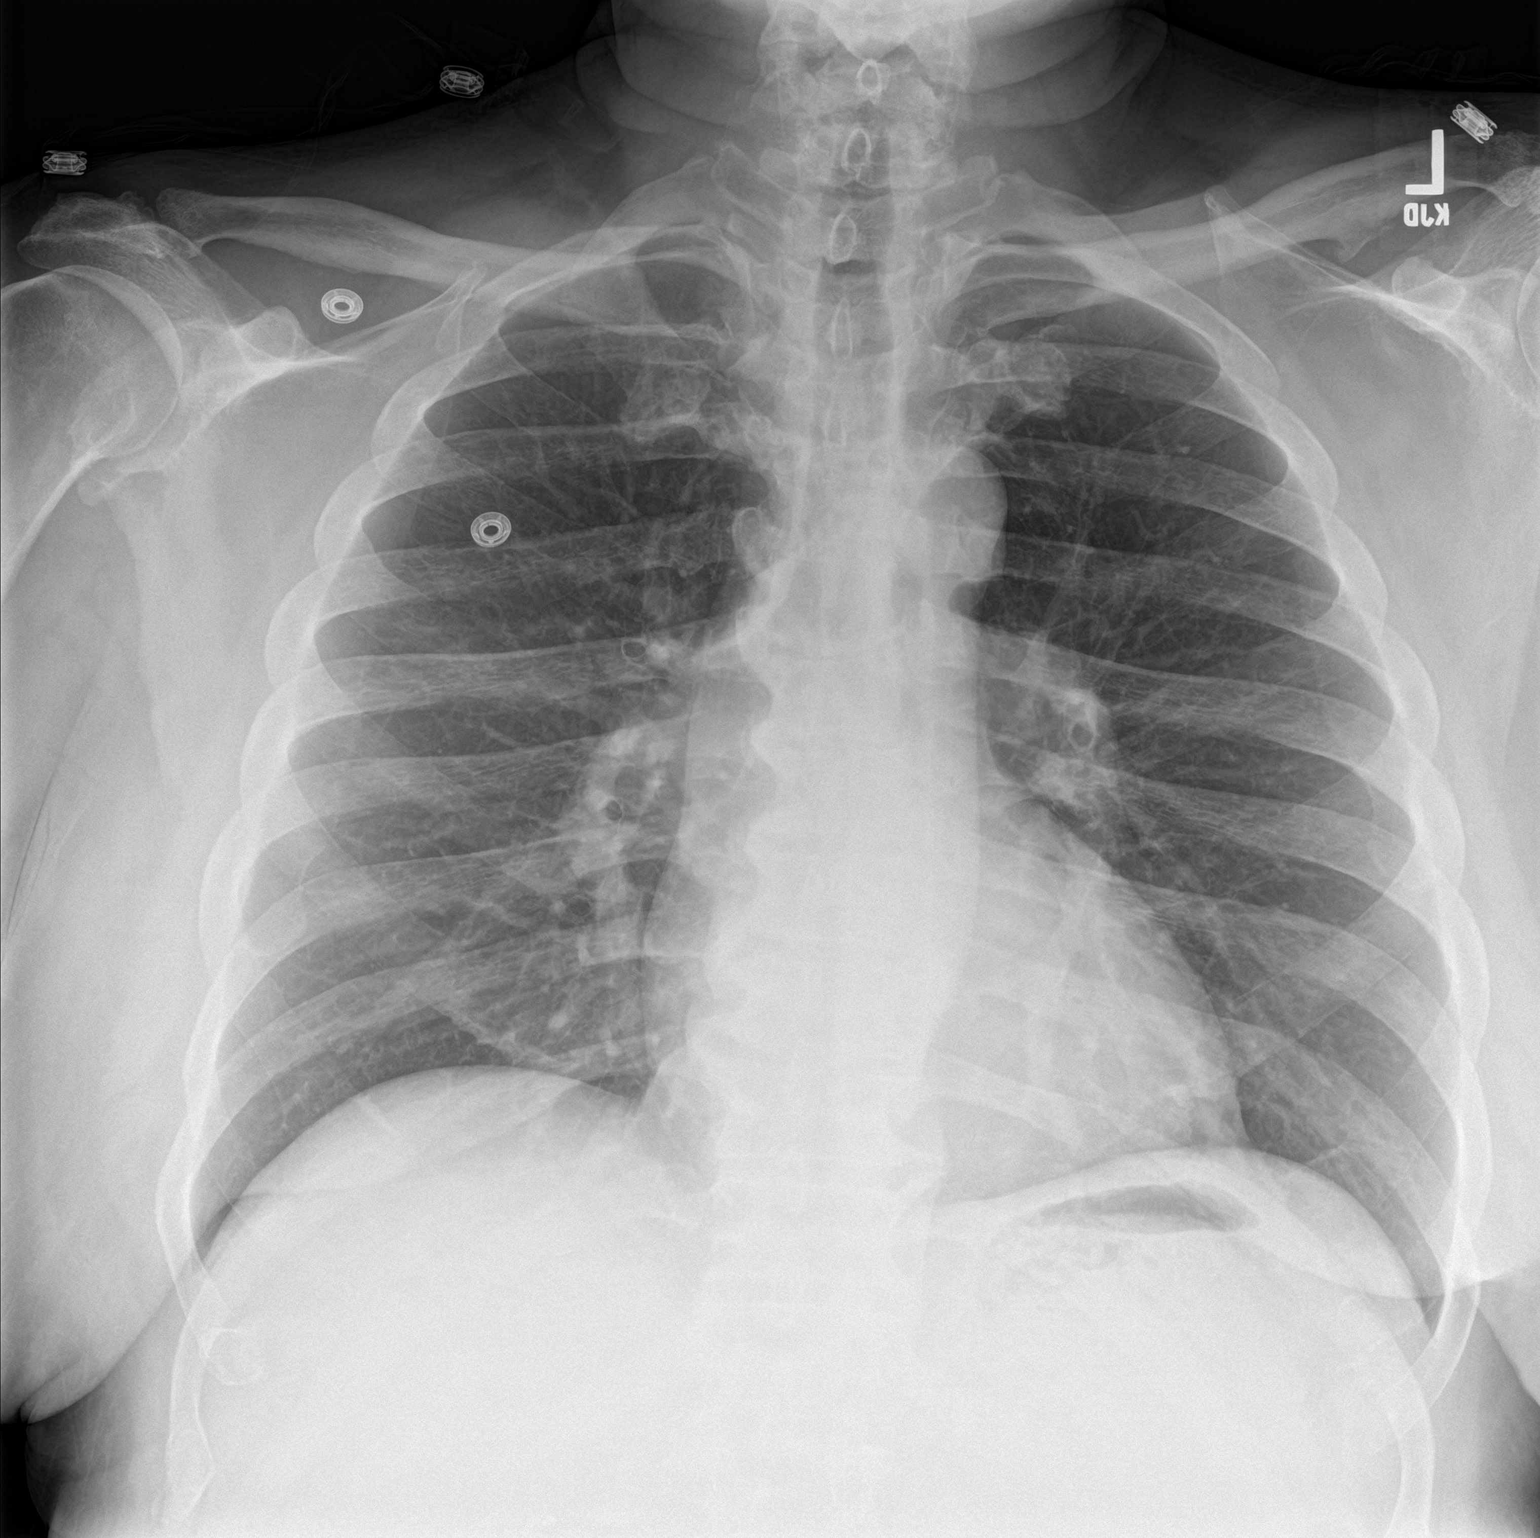

[chest lat]
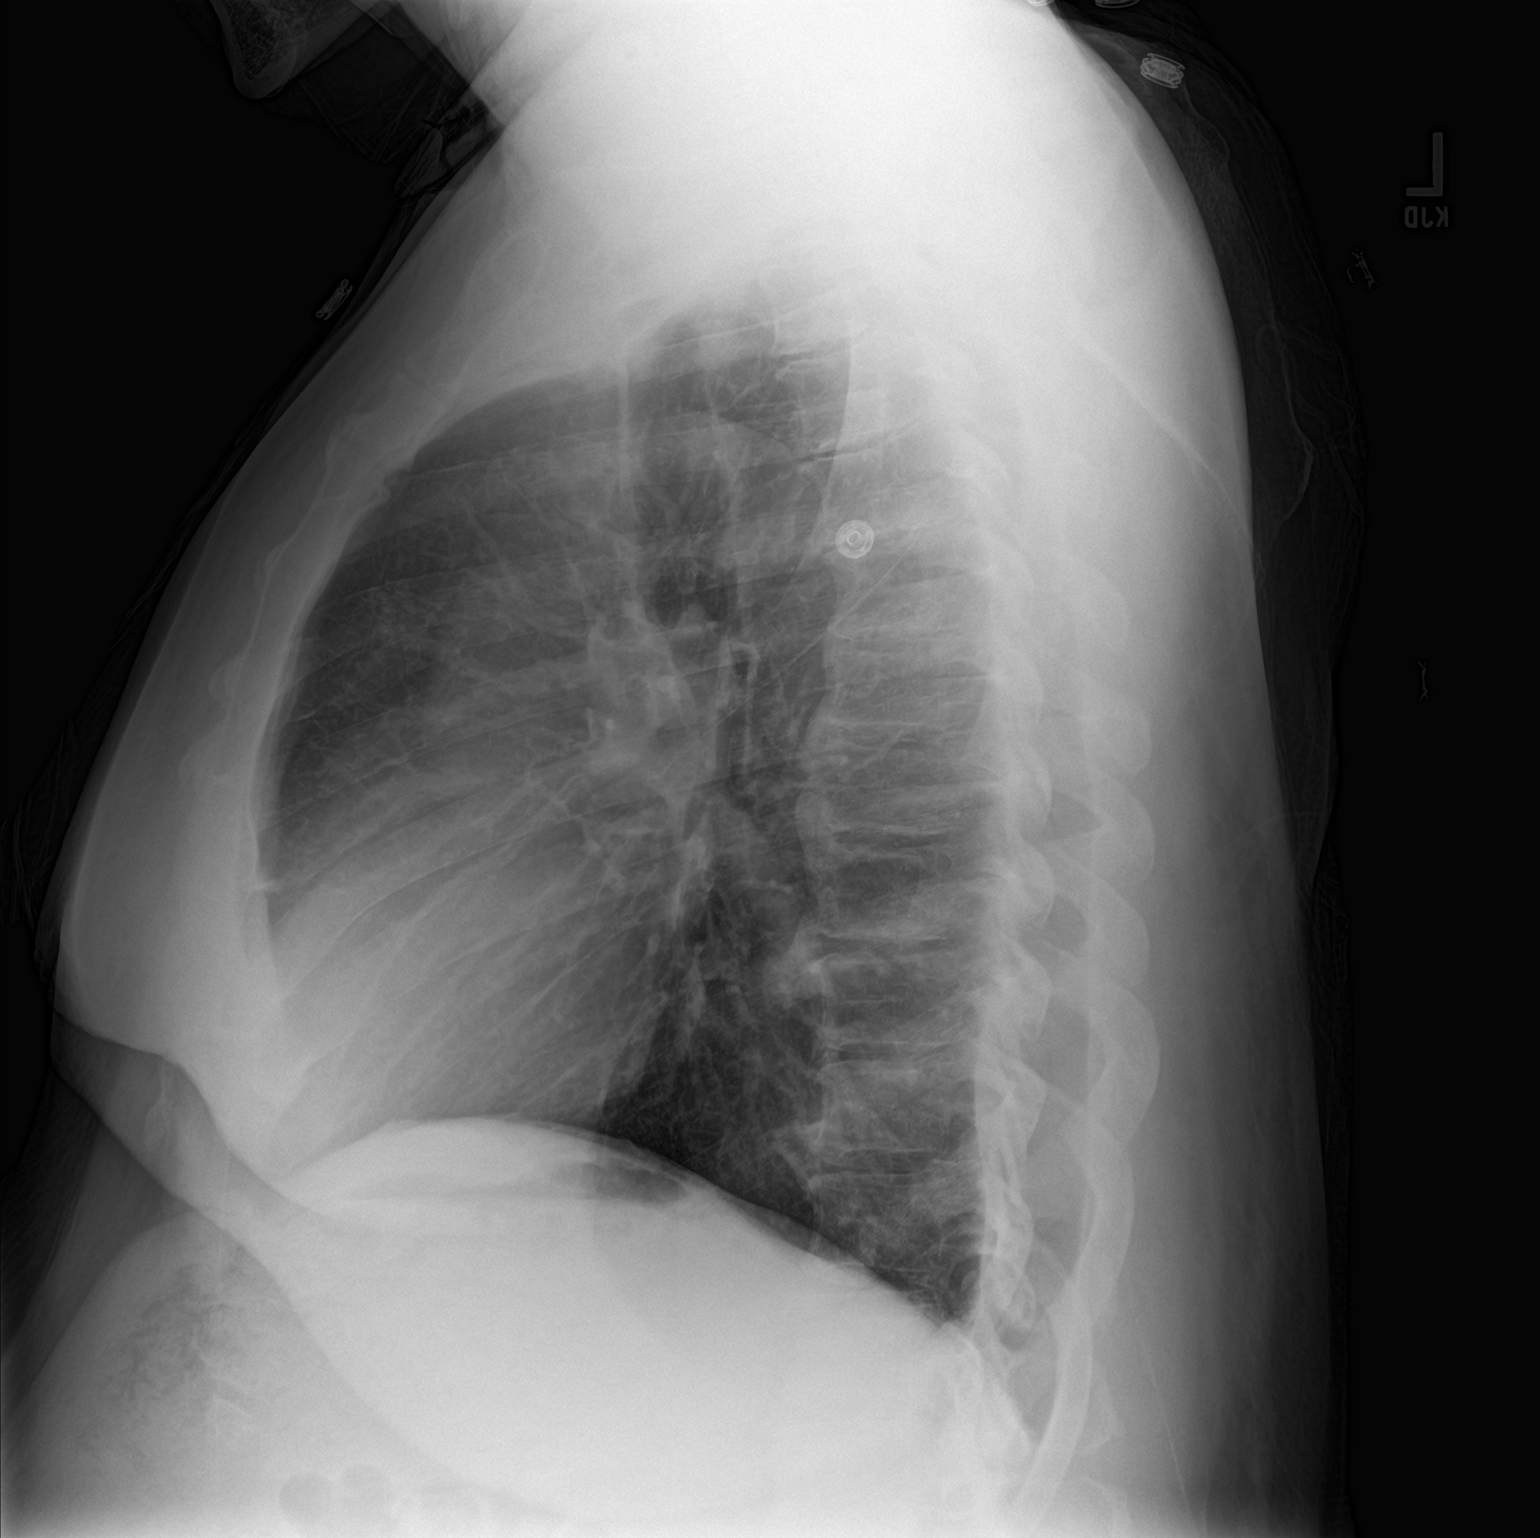

[2 of 2 positions shown; findings below may reference images not displayed]

FINDINGS: The lungs are well-aerated. Mild peribronchial thickening is noted.
There is no evidence of focal opacification, pleural effusion or
pneumothorax.

The heart is normal in size; the mediastinal contour is within
normal limits. No acute osseous abnormalities are seen.
IMPRESSION: Mild peribronchial thickening noted.  Lungs otherwise clear.

## 2018-02-07 IMAGING — DX DG FOOT COMPLETE 3+V*R*
3 series · 3 of 3 positions shown · non-contrast
Comparison: Right foot radiographs performed 10/07/2014

CLINICAL DATA: Acute onset of right leg and foot swelling. Initial
encounter.

EXAM:
RIGHT FOOT COMPLETE - 3+ VIEW

[foot ap]
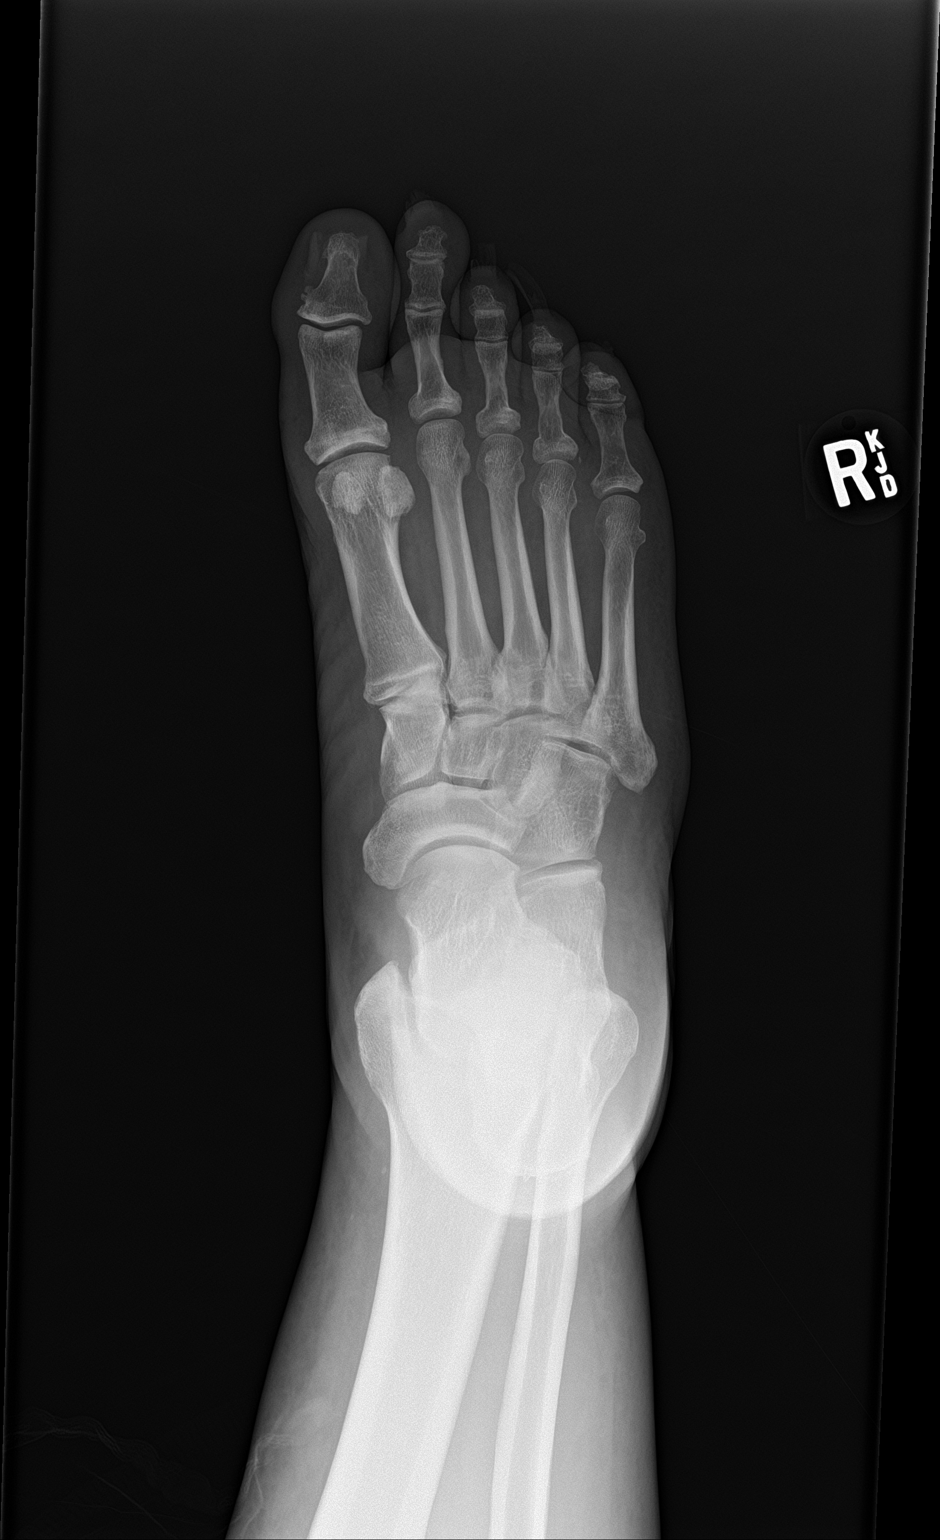

[foot obl]
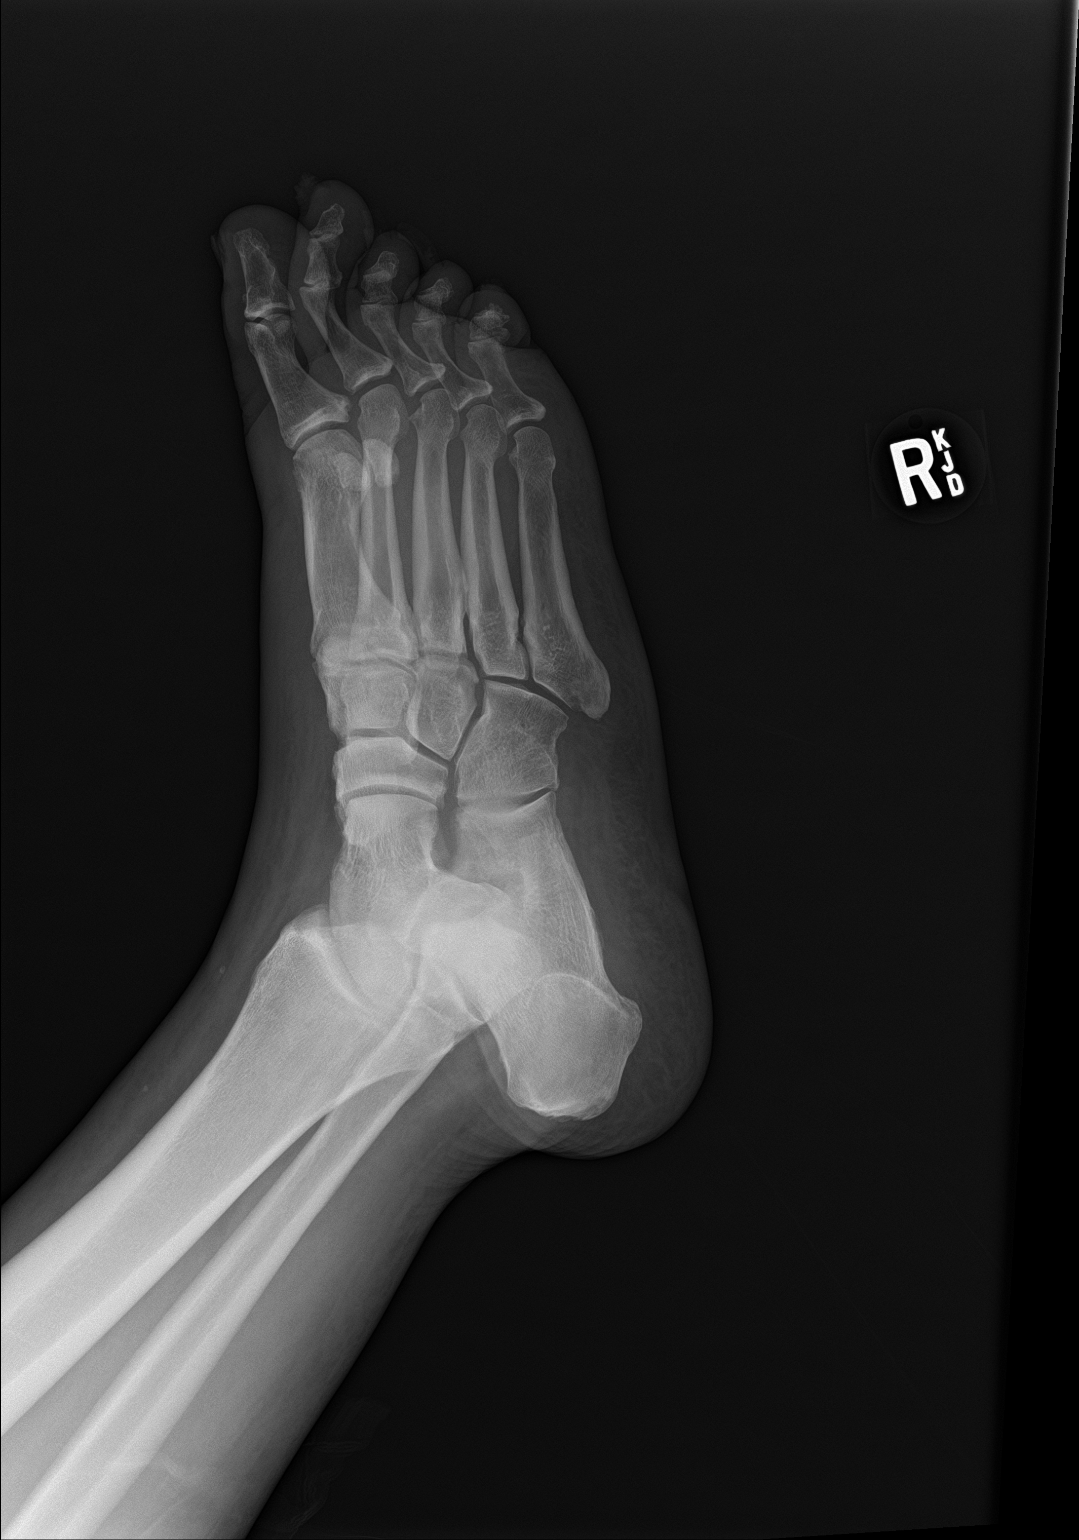

[foot lat]
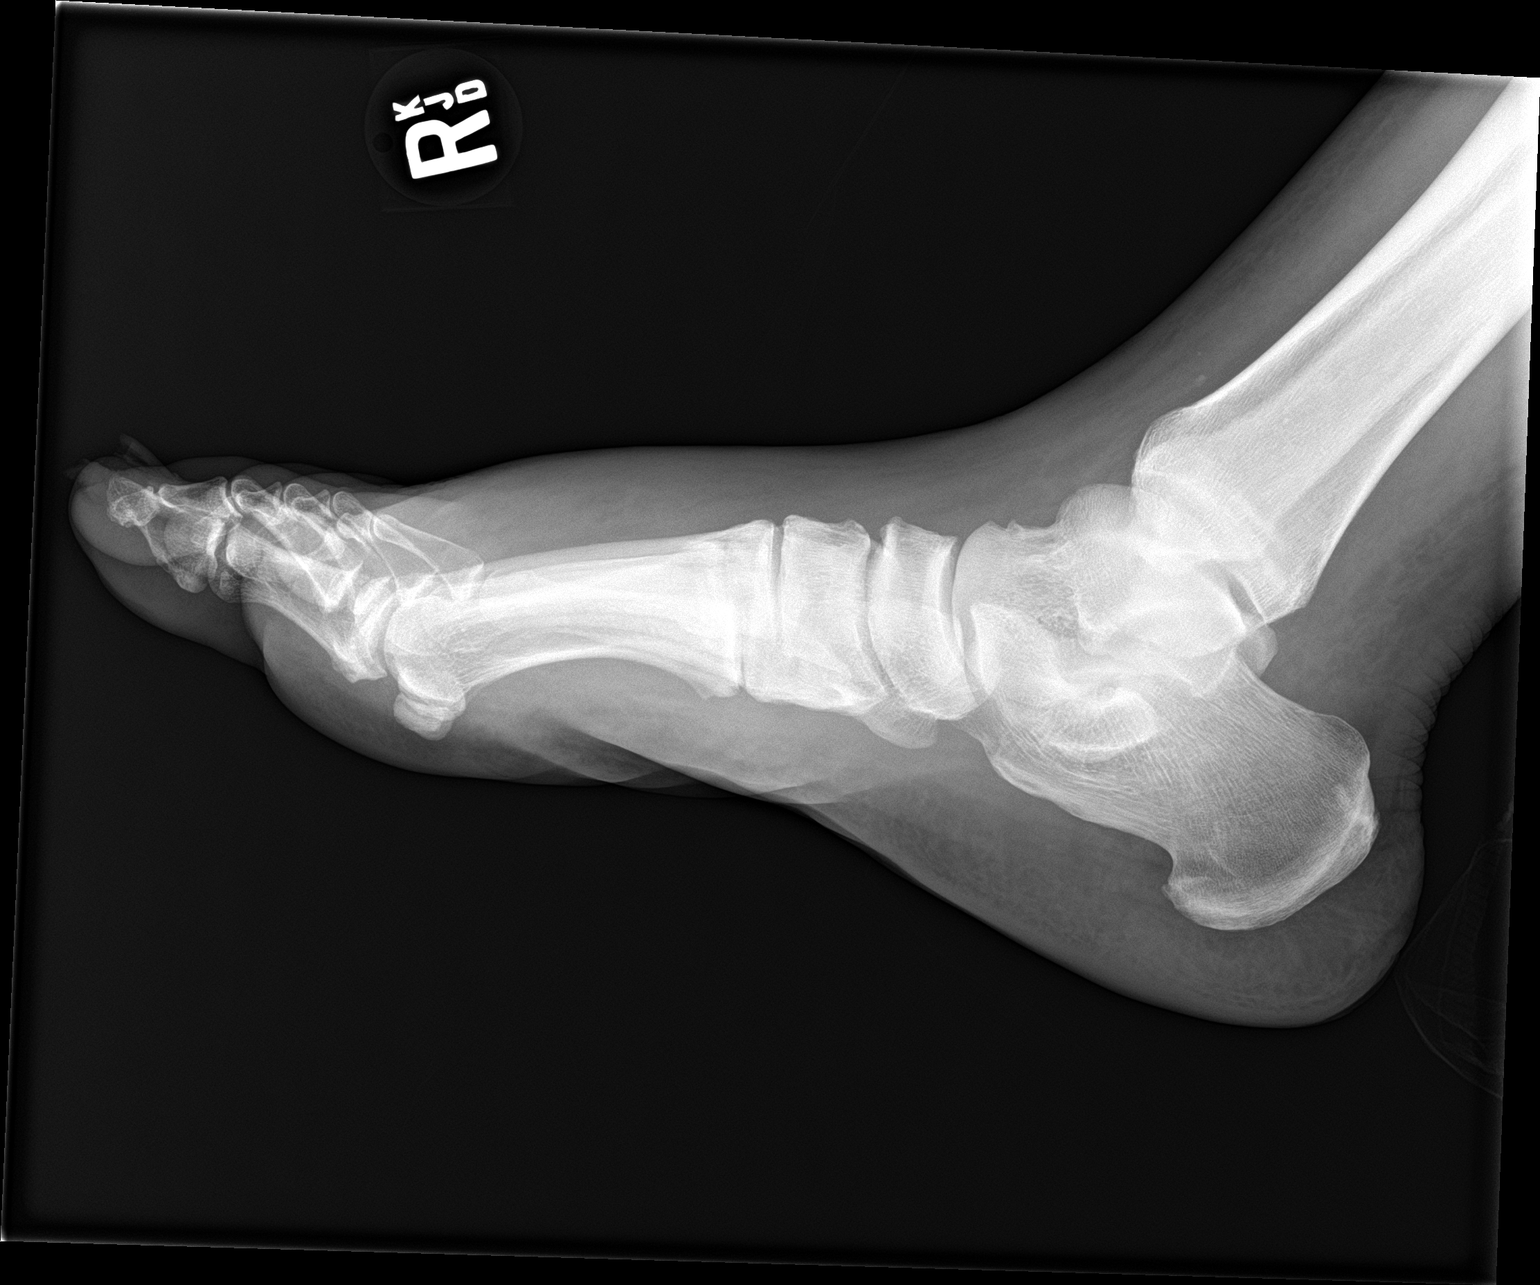

[3 of 3 positions shown; findings below may reference images not displayed]

FINDINGS: There is no evidence of fracture or dislocation. The joint spaces
are preserved. There is no evidence of talar subluxation; the
subtalar joint is unremarkable in appearance.

Diffuse soft tissue swelling is noted about the forefoot, more
prominent dorsally.
IMPRESSION: 1. No evidence of fracture or dislocation.
2. Diffuse soft tissue swelling about the forefoot, more prominent
dorsally.

## 2018-02-13 DIAGNOSIS — M25561 Pain in right knee: Secondary | ICD-10-CM | POA: Diagnosis not present

## 2018-02-13 DIAGNOSIS — N528 Other male erectile dysfunction: Secondary | ICD-10-CM | POA: Diagnosis not present

## 2018-02-13 DIAGNOSIS — J45909 Unspecified asthma, uncomplicated: Secondary | ICD-10-CM | POA: Diagnosis not present

## 2018-02-13 DIAGNOSIS — F418 Other specified anxiety disorders: Secondary | ICD-10-CM | POA: Diagnosis not present

## 2018-02-13 DIAGNOSIS — R05 Cough: Secondary | ICD-10-CM | POA: Diagnosis not present

## 2018-02-13 DIAGNOSIS — Z0001 Encounter for general adult medical examination with abnormal findings: Secondary | ICD-10-CM | POA: Diagnosis not present

## 2018-02-13 DIAGNOSIS — R1013 Epigastric pain: Secondary | ICD-10-CM | POA: Diagnosis not present

## 2018-02-13 DIAGNOSIS — E1165 Type 2 diabetes mellitus with hyperglycemia: Secondary | ICD-10-CM | POA: Diagnosis not present

## 2018-02-13 DIAGNOSIS — Z202 Contact with and (suspected) exposure to infections with a predominantly sexual mode of transmission: Secondary | ICD-10-CM | POA: Diagnosis not present

## 2018-02-13 DIAGNOSIS — E538 Deficiency of other specified B group vitamins: Secondary | ICD-10-CM | POA: Diagnosis not present

## 2018-02-13 DIAGNOSIS — M545 Low back pain: Secondary | ICD-10-CM | POA: Diagnosis not present

## 2018-02-20 DIAGNOSIS — N138 Other obstructive and reflux uropathy: Secondary | ICD-10-CM | POA: Diagnosis not present

## 2018-02-20 DIAGNOSIS — N401 Enlarged prostate with lower urinary tract symptoms: Secondary | ICD-10-CM | POA: Diagnosis not present

## 2018-02-28 IMAGING — DX DG CHEST 2V
2 series · 2 of 2 positions shown · non-contrast
Comparison: 07/08/2015

CLINICAL DATA: Cough, left side chest pain

EXAM:
CHEST  2 VIEW

[chest lat]
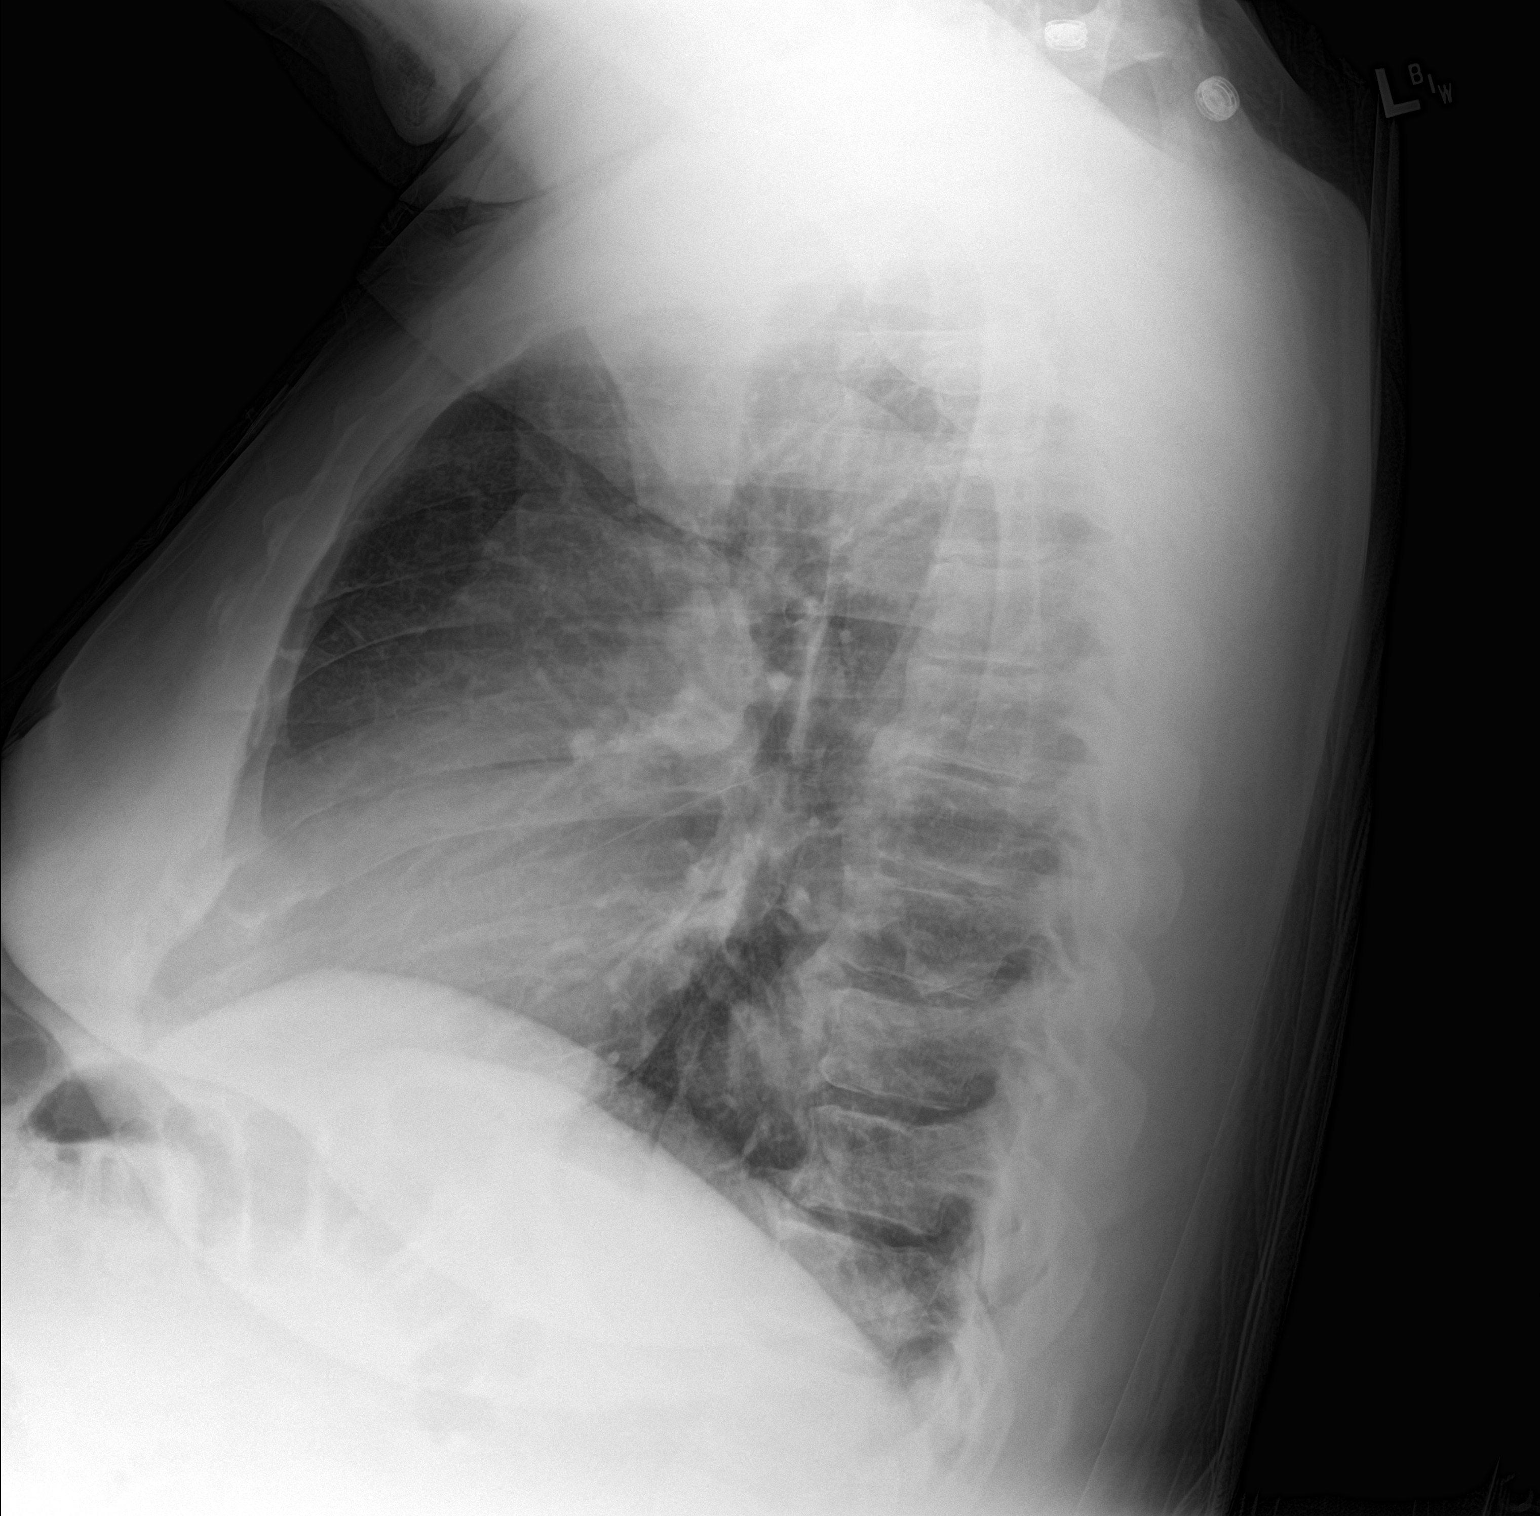

[chest ap]
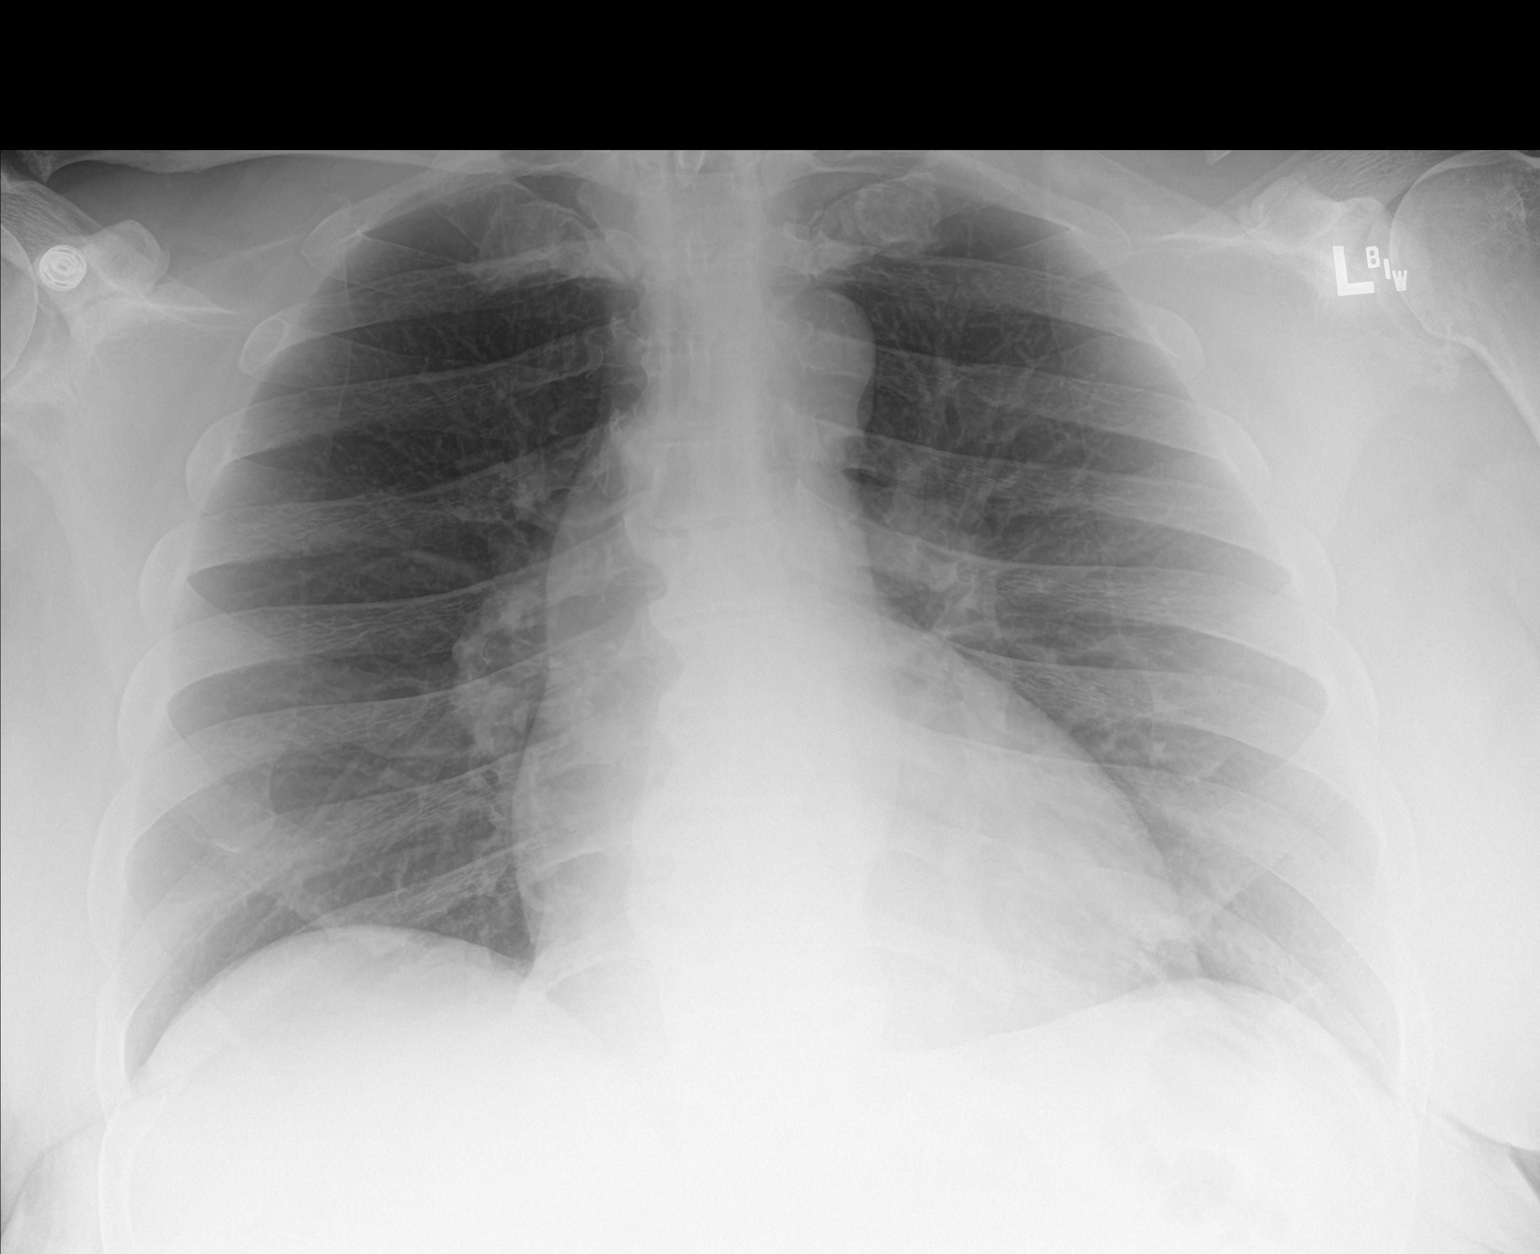

[2 of 2 positions shown; findings below may reference images not displayed]

FINDINGS: Borderline cardiomegaly. No acute infiltrate or pleural effusion. No
pulmonary edema. Degenerative changes mid and lower thoracic spine.
IMPRESSION: No active disease.  Borderline cardiomegaly.

## 2018-02-28 IMAGING — CT CT HEAD W/O CM
3 of 4 series · 16 of 47 positions shown, 19 images · non-contrast
Comparison: September 27, 2014

CLINICAL DATA: Right upper extremity numbness for 1 day

EXAM:
CT HEAD WITHOUT CONTRAST
TECHNIQUE: Contiguous axial images were obtained from the base of the skull
through the vertex without intravenous contrast.

[Series 201: head w/o, idose (1) · axial · non-contrast · 0.45mm/px · z∈[+856,+996]mm · 10 of 34 slices shown, 13 images]
[im 3/34  brain]
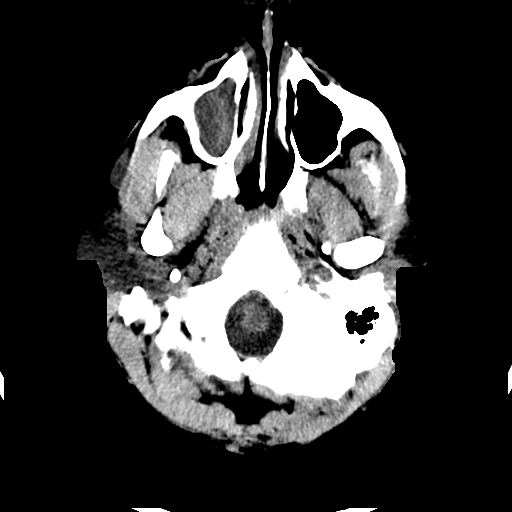
[im 3/34  bone]
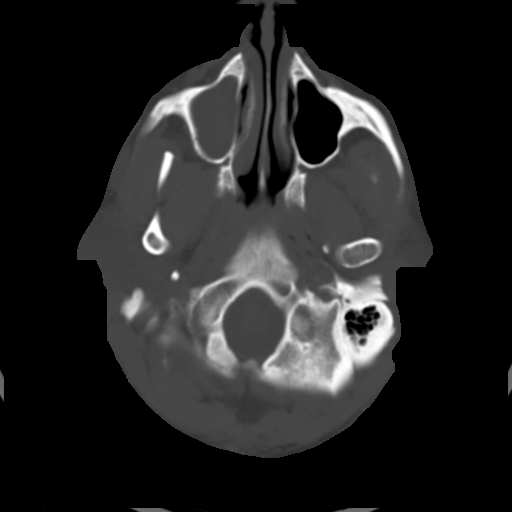
[im 5/34  brain]
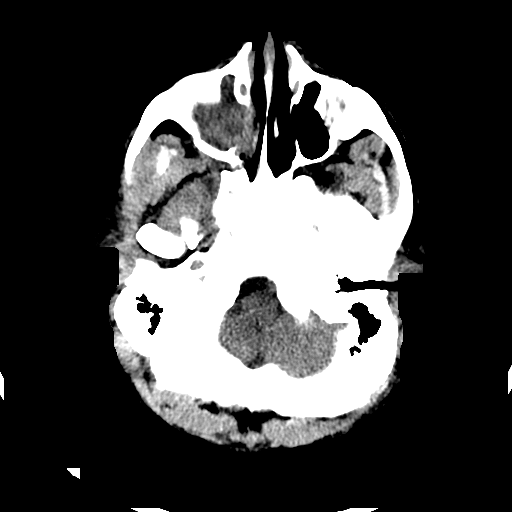
[im 10/34  brain]
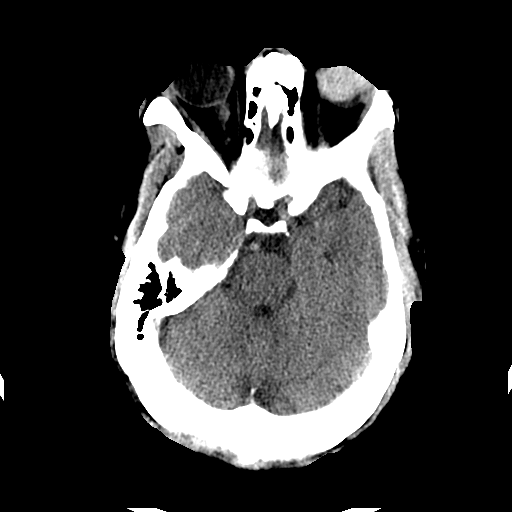
[im 12/34  brain]
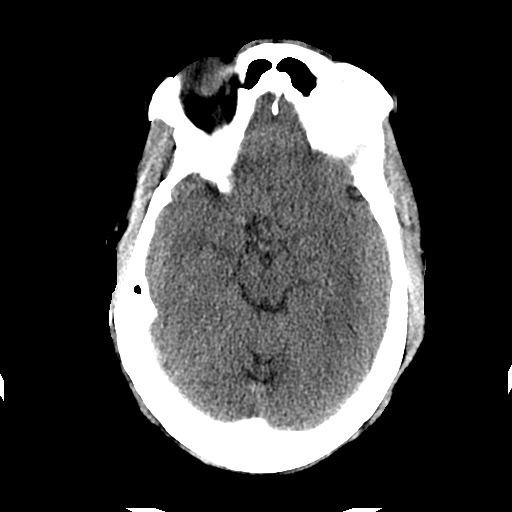
[im 15/34  brain]
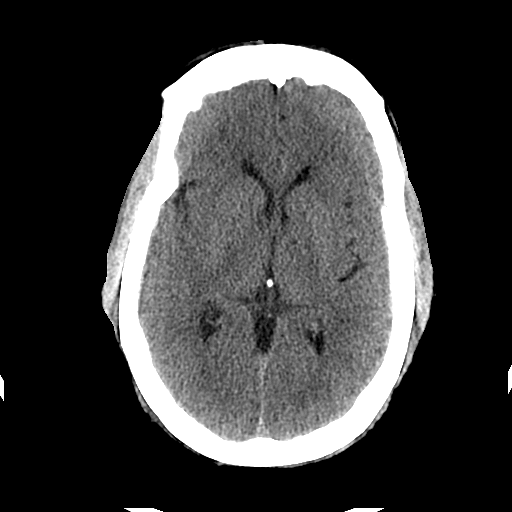
[im 15/34  bone]
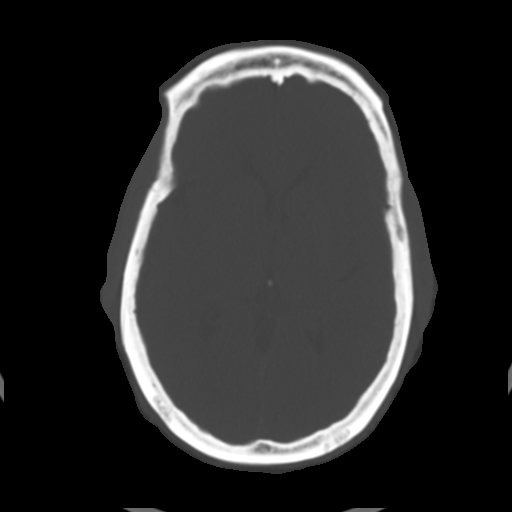
[im 19/34  brain]
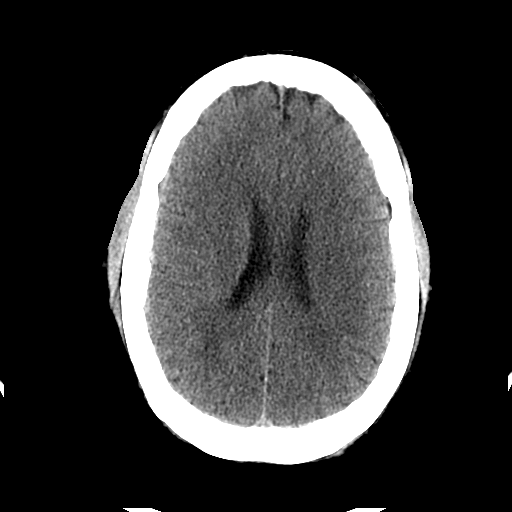
[im 22/34  brain]
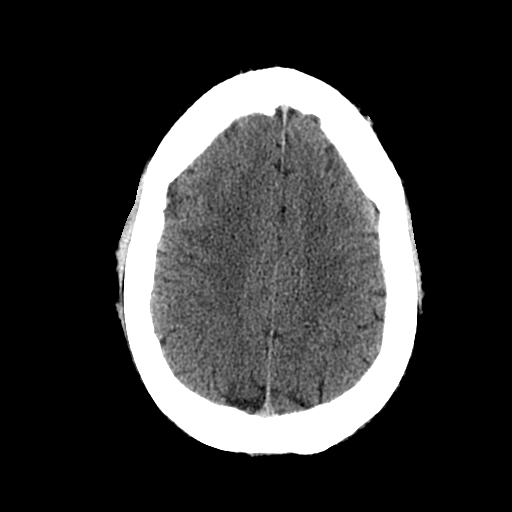
[im 24/34  brain]
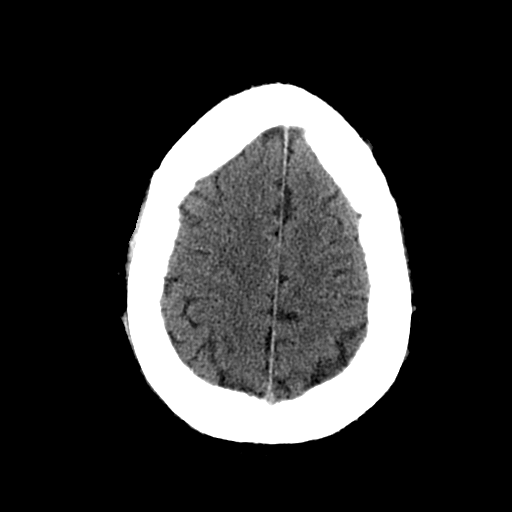
[im 29/34  brain]
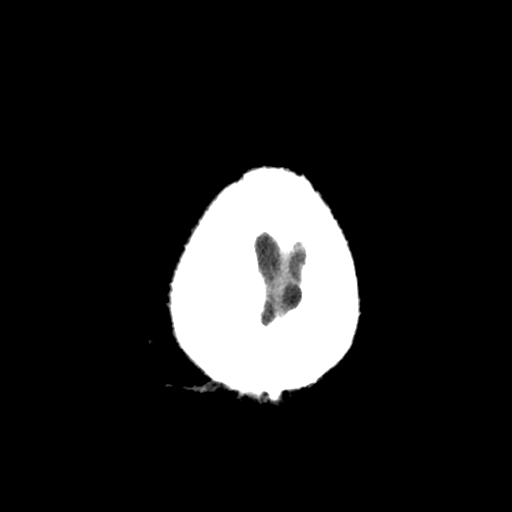
[im 29/34  bone]
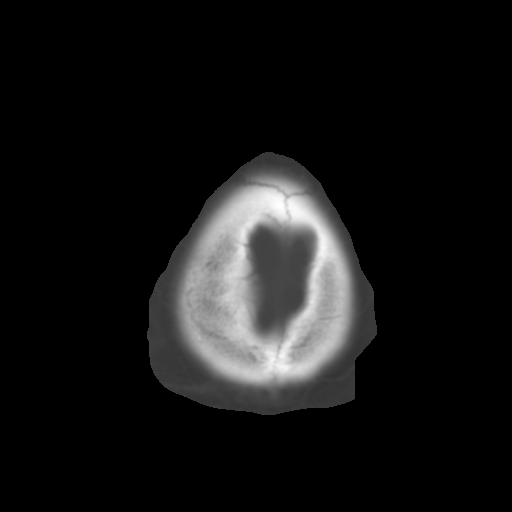
[im 31/34  brain]
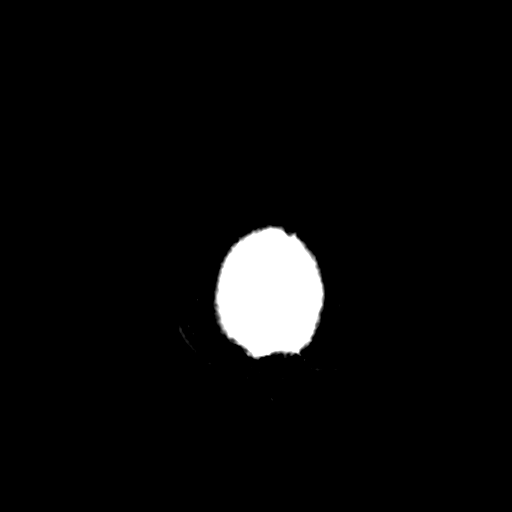

[Series 203: coronal st, idose (1) · coronal · 0.40mm/px · 3 of 77 slices shown]
[im 26/77  brain]
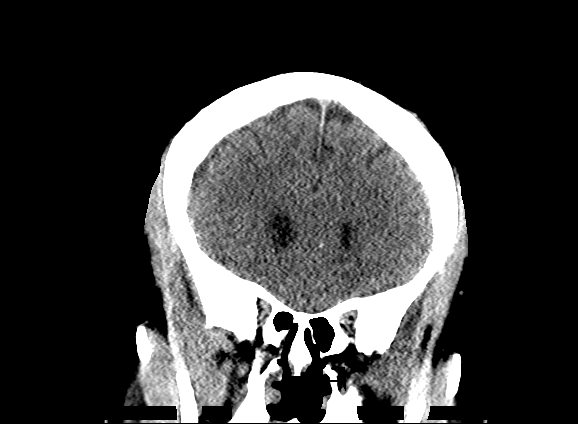
[im 34/77  brain]
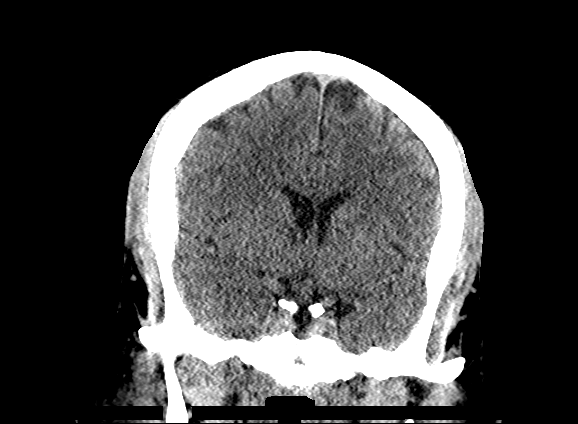
[im 43/77  brain]
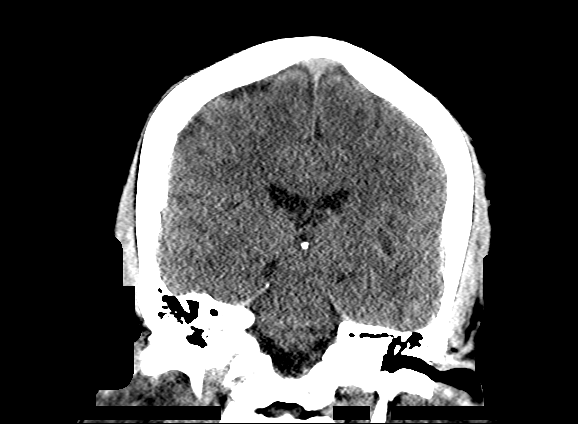

[Series 204: sagittal st, idose (1) · sagittal · 0.40mm/px · 3 of 77 slices shown]
[im 26/77  brain]
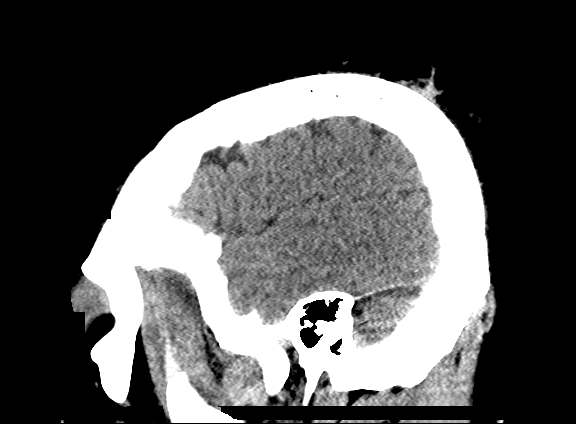
[im 39/77  brain]
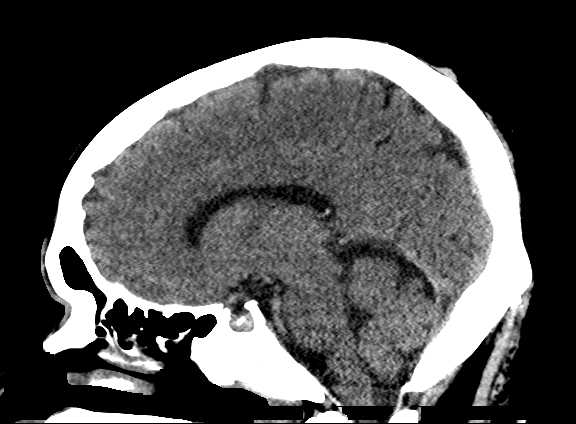
[im 51/77  brain]
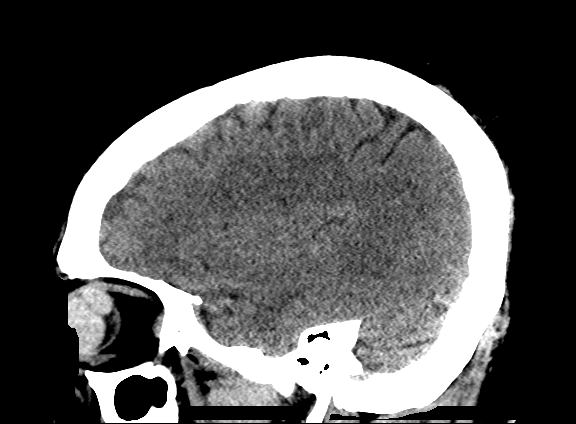

[16 of 47 positions shown; findings below may reference images not displayed]

FINDINGS: Brain: The ventricles are normal in size and configuration. There is
no intracranial mass, hemorrhage, extra-axial fluid collection, or
midline shift. There is minimal small vessel disease in the centra
semiovale bilaterally. Elsewhere, gray-white compartments appear
normal. No acute infarct evident.

Vascular: There is no vascular calcification or hyperdense vessel.

Skull: The bony calvarium appears intact.

Sinuses/Orbits: There is opacification of the visualized right
maxillary antrum with mild expansion medially. There is
opacification of an anterior right ethmoid air cell. Other
visualized paranasal sinuses are clear. There is chronic increased
attenuation in the left globe, likely chronic vitreous hemorrhage.
Small foci of calcification in this area remain as well. The right
orbit appears normal. Left globus smaller than right globe, a stable
finding.

Other:  Mastoid air cells are clear.
IMPRESSION: Minimal periventricular small vessel disease. No intracranial mass,
hemorrhage, or extra-axial fluid collection. No acute infarct
evident.

Question mucocele right maxillary antrum. Mild anterior right
ethmoid sinus disease.

Chronic apparent vitreous hemorrhage left globe, stable.

## 2018-03-01 DIAGNOSIS — M545 Low back pain: Secondary | ICD-10-CM | POA: Diagnosis not present

## 2018-03-01 DIAGNOSIS — F418 Other specified anxiety disorders: Secondary | ICD-10-CM | POA: Diagnosis not present

## 2018-03-01 DIAGNOSIS — R1013 Epigastric pain: Secondary | ICD-10-CM | POA: Diagnosis not present

## 2018-03-01 DIAGNOSIS — J45909 Unspecified asthma, uncomplicated: Secondary | ICD-10-CM | POA: Diagnosis not present

## 2018-03-01 DIAGNOSIS — N528 Other male erectile dysfunction: Secondary | ICD-10-CM | POA: Diagnosis not present

## 2018-03-01 DIAGNOSIS — E538 Deficiency of other specified B group vitamins: Secondary | ICD-10-CM | POA: Diagnosis not present

## 2018-03-01 DIAGNOSIS — E1165 Type 2 diabetes mellitus with hyperglycemia: Secondary | ICD-10-CM | POA: Diagnosis not present

## 2018-03-14 DIAGNOSIS — R202 Paresthesia of skin: Secondary | ICD-10-CM | POA: Diagnosis not present

## 2018-03-28 ENCOUNTER — Encounter (HOSPITAL_BASED_OUTPATIENT_CLINIC_OR_DEPARTMENT_OTHER): Payer: Self-pay

## 2018-03-28 ENCOUNTER — Other Ambulatory Visit: Payer: Self-pay

## 2018-03-28 ENCOUNTER — Emergency Department (HOSPITAL_BASED_OUTPATIENT_CLINIC_OR_DEPARTMENT_OTHER)
Admission: EM | Admit: 2018-03-28 | Discharge: 2018-03-28 | Disposition: A | Discharge status: Home / Self Care | Payer: Medicare Other | Attending: Emergency Medicine | Admitting: Emergency Medicine

## 2018-03-28 DIAGNOSIS — E1165 Type 2 diabetes mellitus with hyperglycemia: Secondary | ICD-10-CM | POA: Diagnosis not present

## 2018-03-28 DIAGNOSIS — M5489 Other dorsalgia: Secondary | ICD-10-CM | POA: Diagnosis present

## 2018-03-28 DIAGNOSIS — F17210 Nicotine dependence, cigarettes, uncomplicated: Secondary | ICD-10-CM | POA: Diagnosis not present

## 2018-03-28 DIAGNOSIS — J45909 Unspecified asthma, uncomplicated: Secondary | ICD-10-CM | POA: Insufficient documentation

## 2018-03-28 DIAGNOSIS — M5416 Radiculopathy, lumbar region: Secondary | ICD-10-CM | POA: Diagnosis not present

## 2018-03-28 DIAGNOSIS — R739 Hyperglycemia, unspecified: Secondary | ICD-10-CM

## 2018-03-28 HISTORY — DX: Dorsalgia, unspecified: M54.9

## 2018-03-28 HISTORY — DX: Other chronic pain: G89.29

## 2018-03-28 LAB — CBC WITH DIFFERENTIAL/PLATELET
Abs Immature Granulocytes: 0.02 10*3/uL (ref 0.00–0.07)
Basophils Absolute: 0 10*3/uL (ref 0.0–0.1)
Basophils Relative: 0 %
Eosinophils Absolute: 0.1 10*3/uL (ref 0.0–0.5)
Eosinophils Relative: 2 %
HCT: 42.8 % (ref 39.0–52.0)
Hemoglobin: 13.9 g/dL (ref 13.0–17.0)
Immature Granulocytes: 0 %
Lymphocytes Relative: 37 %
Lymphs Abs: 1.9 10*3/uL (ref 0.7–4.0)
MCH: 25.4 pg — ABNORMAL LOW (ref 26.0–34.0)
MCHC: 32.5 g/dL (ref 30.0–36.0)
MCV: 78.1 fL — ABNORMAL LOW (ref 80.0–100.0)
Monocytes Absolute: 0.3 10*3/uL (ref 0.1–1.0)
Monocytes Relative: 6 %
Neutro Abs: 2.8 10*3/uL (ref 1.7–7.7)
Neutrophils Relative %: 55 %
Platelets: 158 10*3/uL (ref 150–400)
RBC: 5.48 MIL/uL (ref 4.22–5.81)
RDW: 13.5 % (ref 11.5–15.5)
WBC: 5.1 10*3/uL (ref 4.0–10.5)
nRBC: 0 % (ref 0.0–0.2)

## 2018-03-28 LAB — BASIC METABOLIC PANEL
Anion gap: 9 (ref 5–15)
BUN: 13 mg/dL (ref 8–23)
CO2: 26 mmol/L (ref 22–32)
Calcium: 8.9 mg/dL (ref 8.9–10.3)
Chloride: 97 mmol/L — ABNORMAL LOW (ref 98–111)
Creatinine, Ser: 1.01 mg/dL (ref 0.61–1.24)
GFR calc Af Amer: >60 mL/min (ref >60)
GFR calc non Af Amer: >60 mL/min (ref >60)
Glucose, Bld: 518 mg/dL (ref 70–99)
Potassium: 4.2 mmol/L (ref 3.5–5.1)
Sodium: 132 mmol/L — ABNORMAL LOW (ref 135–145)

## 2018-03-28 LAB — URINALYSIS, ROUTINE W REFLEX MICROSCOPIC
Bilirubin Urine: NEGATIVE
Glucose, UA: >=500 mg/dL — AB
Hgb urine dipstick: NEGATIVE
Ketones, ur: NEGATIVE mg/dL
Leukocytes,Ua: NEGATIVE
Nitrite: NEGATIVE
Protein, ur: NEGATIVE mg/dL
Specific Gravity, Urine: <1.005 — ABNORMAL LOW (ref 1.005–1.030)
pH: 6 (ref 5.0–8.0)

## 2018-03-28 LAB — URINALYSIS, MICROSCOPIC (REFLEX)

## 2018-03-28 MED ORDER — METFORMIN HCL 500 MG PO TABS
1000.0000 mg | ORAL_TABLET | Freq: Once | ORAL | Status: AC
Start: 2018-03-28 — End: 2018-03-28
  Administered 2018-03-28: 1000 mg via ORAL
  Filled 2018-03-28: qty 2

## 2018-03-28 MED ORDER — HYDROCODONE-ACETAMINOPHEN 5-325 MG PO TABS: 1.0000 | ORAL_TABLET | ORAL | 0 refills | Status: DC | PRN

## 2018-03-28 MED ORDER — KETOROLAC TROMETHAMINE 30 MG/ML IJ SOLN
30.0000 mg | Freq: Once | INTRAMUSCULAR | Status: AC
  Administered 2018-03-28: 30 mg via INTRAVENOUS

## 2018-03-28 MED ORDER — DEXAMETHASONE SODIUM PHOSPHATE 10 MG/ML IJ SOLN
10.0000 mg | Freq: Once | INTRAMUSCULAR | Status: AC
  Administered 2018-03-28: 10 mg via INTRAVENOUS

## 2018-03-28 MED ORDER — KETOROLAC TROMETHAMINE 30 MG/ML IJ SOLN
30.0000 mg | Freq: Once | INTRAMUSCULAR | Status: DC
  Filled 2018-03-28: qty 1

## 2018-03-28 MED ORDER — INSULIN REGULAR HUMAN 100 UNIT/ML IJ SOLN
10.0000 [IU] | Freq: Once | INTRAMUSCULAR | Status: DC
  Filled 2018-03-28: qty 1

## 2018-03-28 MED ORDER — DEXAMETHASONE SODIUM PHOSPHATE 10 MG/ML IJ SOLN
10.0000 mg | Freq: Once | INTRAMUSCULAR | Status: DC
  Filled 2018-03-28: qty 1

## 2018-03-28 MED ORDER — CELECOXIB 200 MG PO CAPS: 200.0000 mg | ORAL_CAPSULE | Freq: Two times a day (BID) | ORAL | 0 refills | Status: DC

## 2018-03-28 NOTE — ED Notes (Signed)
PT states understanding of care given, follow up care, and medication prescribed. PT ambulated from ED to car with a steady gait. 

## 2018-03-28 NOTE — ED Provider Notes (Signed)
[facility] EMERGENCY DEPARTMENT Provider Note   CSN: [id number] Arrival date & time: 03/28/18  2029    History   Chief Complaint Chief Complaint  Patient presents with  . Back Pain    HPI Lee Reid is a 63 y.o. male.     Pt presents to the ED today with left low back pain.  Pt said it has been there for over a year, but it comes and goes.  He is afraid his kidneys are failing or that he has cancer.  He denies any radiation of the pain.  He said he's been to the ED and to his pcp.  He had a renal CT in May of 2019 which showed multilevel lumbar degenerative changes and widespread endplate osteophytosis.         Past Medical History:  Diagnosis Date  . Anginal pain ([place])   . Anxiety   . Asthma   . Chronic back pain   . Depression   . Diabetes mellitus without complication ([place])   . Dysrhythmia    irregular heartbeat due to stress  . GERD (gastroesophageal reflux disease)   . Heart murmur    born with heart murmur  . Pneumonia     Patient Active Problem List   Diagnosis Date Noted  . Peripheral arterial disease ([place]) 07/15/2017  . ARF (acute renal failure) ([place]) 09/28/2014  . Abdominal wall cellulitis 09/27/2014  . Cellulitis of abdominal wall 09/27/2014  . S/P cholecystectomy 09/27/2014  . Chronic cholecystitis 09/16/2014  . Preoperative clearance 08/29/2014  . Type 2 diabetes mellitus without complication ([place]) 09/38/1829  . Type 2 diabetes mellitus without complications ([place]) 93/71/6967  . Morbid obesity ([place]) 07/29/2014  . Colon cancer screening 07/29/2014    Past Surgical History:  Procedure Laterality Date  . CHOLECYSTECTOMY N/A 09/16/2014   Procedure: LAPAROSCOPIC CHOLECYSTECTOMY ;  Surgeon: Arta Bruce Kinsinger, MD;  Location: [facility];  Service: General;  Laterality: N/A;  . EYE SURGERY    . TESTICLE SURGERY     growth removed  . TONSILLECTOMY          Home Medications    Prior to Admission medications   Medication Sig  Start Date End Date Taking? Authorizing Provider  celecoxib (CELEBREX) 200 MG capsule Take 1 capsule (200 mg total) by mouth 2 (two) times daily. 03/28/18   Isla Pence, MD  HYDROcodone-acetaminophen (NORCO/VICODIN) 5-325 MG tablet Take 1 tablet by mouth every 4 (four) hours as needed. 03/28/18   Isla Pence, MD    Family History Family History  Problem Relation Age of Onset  . Hypertension Mother   . Heart disease Father   . Cancer Father     Social History Social History   Tobacco Use  . Smoking status: Current Every Day Smoker    Packs/day: 1.00    Types: Cigarettes  . Smokeless tobacco: Never Used  Substance Use Topics  . Alcohol use: No    Frequency: Never    Comment: former   . Drug use: No     Allergies   Bee venom   Review of Systems Review of Systems  Musculoskeletal: Positive for back pain.  All other systems reviewed and are negative.    Physical Exam Updated Vital Signs BP 140/79 (BP Location: Right Arm)   Pulse 89   Temp 97.9 F (36.6 C) (Oral)   Resp 20   Ht 6\' 1"  (1.854 m)   Wt 133.8 kg   SpO2 99%   BMI  38.92 kg/m   Physical Exam Vitals signs and nursing note reviewed.  Constitutional:      Appearance: Normal appearance.  HENT:     Head: Normocephalic and atraumatic.     Right Ear: External ear normal.     Left Ear: External ear normal.     Nose: Nose normal.     Mouth/Throat:     Mouth: Mucous membranes are moist.  Eyes:     Extraocular Movements: Extraocular movements intact.     Conjunctiva/sclera: Conjunctivae normal.     Pupils: Pupils are equal, round, and reactive to light.  Neck:     Musculoskeletal: Normal range of motion and neck supple.  Cardiovascular:     Rate and Rhythm: Normal rate and regular rhythm.     Pulses: Normal pulses.     Heart sounds: Normal heart sounds.  Pulmonary:     Effort: Pulmonary effort is normal.     Breath sounds: Normal breath sounds.  Abdominal:     General: Abdomen is flat. Bowel  sounds are normal.     Palpations: Abdomen is soft.  Musculoskeletal:       Arms:  Skin:    General: Skin is warm and dry.     Capillary Refill: Capillary refill takes less than 2 seconds.  Neurological:     General: No focal deficit present.     Mental Status: He is alert and oriented to person, place, and time.  Psychiatric:        Mood and Affect: Mood normal.        Behavior: Behavior normal.      ED Treatments / Results  Labs (all labs ordered are listed, but only abnormal results are displayed) Labs Reviewed  URINALYSIS, ROUTINE W REFLEX MICROSCOPIC - Abnormal; Notable for the following components:      Result Value   Specific Gravity, Urine <1.005 (*)    Glucose, UA >=500 (*)    All other components within normal limits  BASIC METABOLIC PANEL - Abnormal; Notable for the following components:   Sodium 132 (*)    Chloride 97 (*)    Glucose, Bld 518 (*)    All other components within normal limits  CBC WITH DIFFERENTIAL/PLATELET - Abnormal; Notable for the following components:   MCV 78.1 (*)    MCH 25.4 (*)    All other components within normal limits  URINALYSIS, MICROSCOPIC (REFLEX) - Abnormal; Notable for the following components:   Bacteria, UA RARE (*)    All other components within normal limits    EKG None  Radiology No results found.  Procedures Procedures (including critical care time)  Medications Ordered in ED Medications  insulin regular (NOVOLIN R,HUMULIN R) 100 units/mL injection 10 Units (has no administration in time range)  metFORMIN (GLUCOPHAGE) tablet 1,000 mg (has no administration in time range)  dexamethasone (DECADRON) injection 10 mg (10 mg Intravenous Given 03/28/18 2115)  ketorolac (TORADOL) 30 MG/ML injection 30 mg (30 mg Intravenous Given 03/28/18 2115)     Initial Impression / Assessment and Plan / ED Course  I have reviewed the triage vital signs and the nursing notes.  Pertinent labs & imaging results that were available  during my care of the patient were reviewed by me and considered in my medical decision making (see chart for details).    Pt is feeling much better.  His blood sugar is elevated at 518.  He said he has run out of his meds and has not picked up  his meds in a few days.  He was given 10 units of insulin IV and metformin.  He has significant degenerative changes on his lumbar spine and likely has radicular pain.  I ordered an outpatient MRI.  He is strongly encouraged to f/u with his pcp.  Return if worse.  Final Clinical Impressions(s) / ED Diagnoses   Final diagnoses:  Lumbar radiculopathy  Hyperglycemia    ED Discharge Orders         Ordered    MR LUMBAR SPINE WO CONTRAST     03/28/18 2213    HYDROcodone-acetaminophen (NORCO/VICODIN) 5-325 MG tablet  Every 4 hours PRN     03/28/18 2215    celecoxib (CELEBREX) 200 MG capsule  2 times daily     03/28/18 2215           Isla Pence, MD 03/28/18 2218

## 2018-03-28 NOTE — ED Notes (Signed)
Date and time results received: 03/28/18 2204  Test: glucose Critical Value: 518  Name of Provider Notified: Dr. Gilford Raid  Orders Received? Or Actions Taken?: awaiting additional orders

## 2018-03-28 NOTE — ED Triage Notes (Signed)
Pt c/o left lower back pain for a year and a half, denies urinary symptoms, is "certain it is my kidneys."

## 2018-03-28 NOTE — Discharge Instructions (Addendum)
Pick up your diabetes medications from the pharmacy tomorrow!

## 2018-04-01 ENCOUNTER — Other Ambulatory Visit: Payer: Self-pay

## 2018-04-01 ENCOUNTER — Ambulatory Visit (HOSPITAL_BASED_OUTPATIENT_CLINIC_OR_DEPARTMENT_OTHER)
Admission: RE | Admit: 2018-04-01 | Discharge: 2018-04-01 | Disposition: A | Discharge status: Home / Self Care | Payer: Medicare Other | Source: Ambulatory Visit | Attending: Emergency Medicine | Admitting: Emergency Medicine

## 2018-04-01 DIAGNOSIS — M5136 Other intervertebral disc degeneration, lumbar region: Secondary | ICD-10-CM | POA: Insufficient documentation

## 2018-04-01 DIAGNOSIS — M48061 Spinal stenosis, lumbar region without neurogenic claudication: Secondary | ICD-10-CM | POA: Diagnosis not present

## 2018-04-01 DIAGNOSIS — M4856XA Collapsed vertebra, not elsewhere classified, lumbar region, initial encounter for fracture: Secondary | ICD-10-CM | POA: Diagnosis not present

## 2018-04-01 DIAGNOSIS — M545 Low back pain: Secondary | ICD-10-CM | POA: Diagnosis not present

## 2018-04-07 DIAGNOSIS — F418 Other specified anxiety disorders: Secondary | ICD-10-CM | POA: Diagnosis not present

## 2018-04-07 DIAGNOSIS — E1165 Type 2 diabetes mellitus with hyperglycemia: Secondary | ICD-10-CM | POA: Diagnosis not present

## 2018-04-07 DIAGNOSIS — E538 Deficiency of other specified B group vitamins: Secondary | ICD-10-CM | POA: Diagnosis not present

## 2018-04-07 DIAGNOSIS — R1013 Epigastric pain: Secondary | ICD-10-CM | POA: Diagnosis not present

## 2018-04-07 DIAGNOSIS — J45909 Unspecified asthma, uncomplicated: Secondary | ICD-10-CM | POA: Diagnosis not present

## 2018-04-09 ENCOUNTER — Encounter (HOSPITAL_BASED_OUTPATIENT_CLINIC_OR_DEPARTMENT_OTHER): Payer: Self-pay

## 2018-04-09 ENCOUNTER — Emergency Department (HOSPITAL_BASED_OUTPATIENT_CLINIC_OR_DEPARTMENT_OTHER)
Admission: EM | Admit: 2018-04-09 | Discharge: 2018-04-09 | Disposition: A | Discharge status: Home / Self Care | Payer: Medicare Other | Source: Home / Self Care | Attending: Emergency Medicine | Admitting: Emergency Medicine

## 2018-04-09 ENCOUNTER — Emergency Department (HOSPITAL_BASED_OUTPATIENT_CLINIC_OR_DEPARTMENT_OTHER)
Admission: EM | Admit: 2018-04-09 | Discharge: 2018-04-09 | Disposition: A | Discharge status: Home / Self Care | Payer: Medicare Other | Attending: Emergency Medicine | Admitting: Emergency Medicine

## 2018-04-09 ENCOUNTER — Other Ambulatory Visit: Payer: Self-pay

## 2018-04-09 ENCOUNTER — Encounter (HOSPITAL_BASED_OUTPATIENT_CLINIC_OR_DEPARTMENT_OTHER): Payer: Self-pay | Admitting: *Deleted

## 2018-04-09 DIAGNOSIS — K1379 Other lesions of oral mucosa: Secondary | ICD-10-CM

## 2018-04-09 DIAGNOSIS — J45909 Unspecified asthma, uncomplicated: Secondary | ICD-10-CM

## 2018-04-09 DIAGNOSIS — E119 Type 2 diabetes mellitus without complications: Secondary | ICD-10-CM

## 2018-04-09 DIAGNOSIS — F17210 Nicotine dependence, cigarettes, uncomplicated: Secondary | ICD-10-CM

## 2018-04-09 DIAGNOSIS — B356 Tinea cruris: Secondary | ICD-10-CM | POA: Diagnosis not present

## 2018-04-09 DIAGNOSIS — L662 Folliculitis decalvans: Secondary | ICD-10-CM | POA: Diagnosis not present

## 2018-04-09 DIAGNOSIS — N4889 Other specified disorders of penis: Secondary | ICD-10-CM | POA: Diagnosis present

## 2018-04-09 DIAGNOSIS — L739 Follicular disorder, unspecified: Secondary | ICD-10-CM | POA: Insufficient documentation

## 2018-04-09 LAB — CBG MONITORING, ED: Glucose-Capillary: 251 mg/dL — ABNORMAL HIGH (ref 70–99)

## 2018-04-09 MED ORDER — CLOTRIMAZOLE 1 % EX CREA: TOPICAL_CREAM | CUTANEOUS | 0 refills | Status: DC

## 2018-04-09 MED ORDER — DOXYCYCLINE HYCLATE 100 MG PO CAPS: 100.0000 mg | ORAL_CAPSULE | Freq: Two times a day (BID) | ORAL | 0 refills | Status: DC

## 2018-04-09 MED ORDER — MAGIC MOUTHWASH: 5.0000 mL | Freq: Three times a day (TID) | ORAL | 0 refills | Status: DC | PRN

## 2018-04-09 NOTE — ED Triage Notes (Signed)
Pt states starting 1 year ago began having sores in groin/pelvic area, seeing pmd who has referred him to urologist.  States they come and go on their own usually but today reports more pain associated with each area.

## 2018-04-09 NOTE — ED Provider Notes (Signed)
Emergency Department Provider Note   I have reviewed the triage vital signs and the nursing notes.   HISTORY  Chief Complaint Numbness   HPI HORACIO WERTH is a 63 y.o. male with PMH of DM, GERD, asthma returns to the emergency department with tight sensation to the back of the head.  No specific numbness.  Symptoms are bilateral and in a patchy distribution in the back of his head.  No face, arm, leg symptoms.  Patient has some developing pain in his mouth and continues to have discomfort in his groin region.  He was seen in the emergency department earlier today and diagnosed with folliculitis.  He was started on doxycycline and states he is started taking this medication.  No fevers or chills. No HA or neck pain.   Past Medical History:  Diagnosis Date  . Anginal pain ([place])   . Anxiety   . Asthma   . Chronic back pain   . Depression   . Diabetes mellitus without complication ([place])   . Dysrhythmia    irregular heartbeat due to stress  . GERD (gastroesophageal reflux disease)   . Heart murmur    born with heart murmur  . Pneumonia     Patient Active Problem List   Diagnosis Date Noted  . Peripheral arterial disease ([place]) 07/15/2017  . ARF (acute renal failure) ([place]) 09/28/2014  . Abdominal wall cellulitis 09/27/2014  . Cellulitis of abdominal wall 09/27/2014  . S/P cholecystectomy 09/27/2014  . Chronic cholecystitis 09/16/2014  . Preoperative clearance 08/29/2014  . Type 2 diabetes mellitus without complication ([place]) 40/98/1191  . Type 2 diabetes mellitus without complications ([place]) 47/82/9562  . Morbid obesity ([place]) 07/29/2014  . Colon cancer screening 07/29/2014    Past Surgical History:  Procedure Laterality Date  . CHOLECYSTECTOMY N/A 09/16/2014   Procedure: LAPAROSCOPIC CHOLECYSTECTOMY ;  Surgeon: Arta Bruce Kinsinger, MD;  Location: [facility];  Service: General;  Laterality: N/A;  . EYE SURGERY    . TESTICLE SURGERY     growth removed  . TONSILLECTOMY      Allergies Bee venom  Family History  Problem Relation Age of Onset  . Hypertension Mother   . Heart disease Father   . Cancer Father     Social History Social History   Tobacco Use  . Smoking status: Current Every Day Smoker    Packs/day: 1.00    Types: Cigarettes  . Smokeless tobacco: Never Used  Substance Use Topics  . Alcohol use: Not Currently    Frequency: Never    Comment: former   . Drug use: No    Review of Systems  Constitutional: No fever/chills Eyes: No visual changes. ENT: No sore throat. Pain and bumps in the back.  Cardiovascular: Denies chest pain. Respiratory: Denies shortness of breath. Gastrointestinal: No abdominal pain.  No nausea, no vomiting.  No diarrhea.  No constipation. Genitourinary: Negative for dysuria. Musculoskeletal: Negative for back pain. Skin: Negative for rash. Positive inguinal pain.  Neurological: Negative for headaches, focal weakness. Paresthesias to the back of the head/tightness.   10-point ROS otherwise negative.  ____________________________________________   PHYSICAL EXAM:  VITAL SIGNS: ED Triage Vitals  Enc Vitals Group     BP 04/09/18 2219 126/77     Pulse Rate 04/09/18 2219 91     Resp 04/09/18 2219 20     Temp 04/09/18 2219 98.1 F (36.7 C)     Temp Source 04/09/18 2219 Oral     SpO2 04/09/18 2219  97 %     Weight 04/09/18 2217 293 lb 3.4 oz (133 kg)     Height 04/09/18 2217 6\' 1"  (1.854 m)     Pain Score 04/09/18 2216 10   Constitutional: Alert and oriented. Well appearing and in no acute distress. Eyes: Conjunctivae are normal.  Head: Atraumatic. Nose: No congestion/rhinnorhea. Mouth/Throat: Mucous membranes are moist. No ulcers. No thrush.  Neck: No stridor.  Cardiovascular: Normal rate, regular rhythm. Good peripheral circulation. Grossly normal heart sounds.   Respiratory: Normal respiratory effort.  No retractions. Lungs CTAB. Gastrointestinal: Soft and nontender. No distention.   Musculoskeletal: No lower extremity tenderness nor edema. No gross deformities of extremities. Neurologic:  Normal speech and language. No gross focal neurologic deficits are appreciated. No weakness/numbness.  Skin:  Skin is warm and dry. Folliculitis noted in the bilateral inguinal region. No abscess.   ____________________________________________  RADIOLOGY  None  ____________________________________________   PROCEDURES  Procedure(s) performed:   Procedures  None  ____________________________________________   INITIAL IMPRESSION / ASSESSMENT AND PLAN / ED COURSE  Pertinent labs & imaging results that were available during my care of the patient were reviewed by me and considered in my medical decision making (see chart for details).  Patient describing a tightness type sensation in the back of his head.  No objective neurologic findings.  Symptoms are bilateral and patch-like.  Not consistent with stroke.  No ulcerations in the mouth.  Patient is describing some pain.  He was tested for HIV within the last 3 months and reports it was negative.  Plan for Magic mouthwash.  No lesion in the groin requiring incision and drainage.  Plan for continue doxycycline at home as prescribed earlier and PCP follow-up.   ____________________________________________  FINAL CLINICAL IMPRESSION(S) / ED DIAGNOSES  Final diagnoses:  Folliculitis  Mouth pain    NEW OUTPATIENT MEDICATIONS STARTED DURING THIS VISIT:  New Prescriptions   MAGIC MOUTHWASH SOLN    Take 5 mLs by mouth 3 (three) times daily as needed for mouth pain.    Note:  This document was prepared using Dragon voice recognition software and may include unintentional dictation errors.  Nanda Quinton, MD Emergency Medicine    Long, [facility], MD 04/09/18 [phone]

## 2018-04-09 NOTE — ED Triage Notes (Addendum)
Pt reports numbness to back of head since 3pm, states Sx have been gradually worsening. Pt reports he also had "boils" to his pelvic area that are very painful. Reports his tongue has been painful since 5pm also, "lie bumps". Pt ambulated to room 2 with slow steady gait. Pt was seen here earlier today for the boils but they weren't as painful then

## 2018-04-09 NOTE — ED Notes (Signed)
ED Provider at bedside. 

## 2018-04-09 NOTE — Discharge Instructions (Addendum)
You may need to follow with a dermatologist for further treatment of your infections.

## 2018-04-09 NOTE — Discharge Instructions (Signed)
Continue your antibiotics. Return to the ED with any new or worsening symptoms.

## 2018-04-09 NOTE — ED Provider Notes (Signed)
[facility] EMERGENCY DEPARTMENT Provider Note   CSN: [id number] Arrival date & time: 04/09/18  0800    History   Chief Complaint No chief complaint on file.   HPI Lee Reid is a 63 y.o. male.     HPI Patient presents with painful areas in his groin.  Has had for months.  Reportedly is been seen by his doctor and urology.  States they have not been able to give him a diagnosis.  States he has had creams have not helped.  No fevers.  No dysuria.  States he has had some white discharge under his foreskin.  The lesions have come and gone sometimes there more painful.  These states they feel different.  Have had not had drainage.  They have not been drained by PCP or urology. Past Medical History:  Diagnosis Date  . Anginal pain ([place])   . Anxiety   . Asthma   . Chronic back pain   . Depression   . Diabetes mellitus without complication ([place])   . Dysrhythmia    irregular heartbeat due to stress  . GERD (gastroesophageal reflux disease)   . Heart murmur    born with heart murmur  . Pneumonia     Patient Active Problem List   Diagnosis Date Noted  . Peripheral arterial disease ([place]) 07/15/2017  . ARF (acute renal failure) ([place]) 09/28/2014  . Abdominal wall cellulitis 09/27/2014  . Cellulitis of abdominal wall 09/27/2014  . S/P cholecystectomy 09/27/2014  . Chronic cholecystitis 09/16/2014  . Preoperative clearance 08/29/2014  . Type 2 diabetes mellitus without complication ([place]) 25/95/6387  . Type 2 diabetes mellitus without complications ([place]) 56/43/3295  . Morbid obesity ([place]) 07/29/2014  . Colon cancer screening 07/29/2014    Past Surgical History:  Procedure Laterality Date  . CHOLECYSTECTOMY N/A 09/16/2014   Procedure: LAPAROSCOPIC CHOLECYSTECTOMY ;  Surgeon: Arta Bruce Kinsinger, MD;  Location: [facility];  Service: General;  Laterality: N/A;  . EYE SURGERY    . TESTICLE SURGERY     growth removed  . TONSILLECTOMY          Home Medications     Prior to Admission medications   Medication Sig Start Date End Date Taking? Authorizing Provider  celecoxib (CELEBREX) 200 MG capsule Take 1 capsule (200 mg total) by mouth 2 (two) times daily. 03/28/18   Isla Pence, MD  clotrimazole (LOTRIMIN) 1 % cream Apply to affected area 2 times daily 04/09/18   Davonna Belling, MD  doxycycline (VIBRAMYCIN) 100 MG capsule Take 1 capsule (100 mg total) by mouth 2 (two) times daily. 04/09/18   Davonna Belling, MD  HYDROcodone-acetaminophen (NORCO/VICODIN) 5-325 MG tablet Take 1 tablet by mouth every 4 (four) hours as needed. 03/28/18   Isla Pence, MD    Family History Family History  Problem Relation Age of Onset  . Hypertension Mother   . Heart disease Father   . Cancer Father     Social History Social History   Tobacco Use  . Smoking status: Current Every Day Smoker    Packs/day: 1.00    Types: Cigarettes  . Smokeless tobacco: Never Used  Substance Use Topics  . Alcohol use: No    Frequency: Never    Comment: former   . Drug use: No     Allergies   Bee venom   Review of Systems Review of Systems  Constitutional: Negative for appetite change.  Respiratory: Negative for shortness of breath.   Cardiovascular: Negative for chest  pain.  Gastrointestinal: Negative for abdominal pain.  Genitourinary: Positive for genital sores. Negative for penile pain, penile swelling and urgency.  Skin: Positive for rash and wound.  Neurological: Negative for weakness.     Physical Exam Updated Vital Signs BP (!) 144/94 (BP Location: Right Arm)   Pulse 94   Temp 98.1 F (36.7 C) (Oral)   Resp 18   Ht 6\' 1"  (1.854 m)   Wt 133 kg   SpO2 97%   BMI 38.68 kg/m   Physical Exam Vitals signs and nursing note reviewed.  HENT:     Head: Atraumatic.  Abdominal:     Tenderness: There is no abdominal tenderness.  Genitourinary:    Comments: Some whitish discharge under foreskin and under fold on abdomen.  No urethral drainage.  In  the pubic hair bilaterally there are approximate 1.5 cm tender indurated bumps.  No fluctuance.  Mild erythema. Skin:    General: Skin is warm.  Neurological:     Mental Status: He is alert and oriented to person, place, and time.      ED Treatments / Results  Labs (all labs ordered are listed, but only abnormal results are displayed) Labs Reviewed  CBG MONITORING, ED - Abnormal; Notable for the following components:      Result Value   Glucose-Capillary 251 (*)    All other components within normal limits    EKG None  Radiology No results found.  Procedures Procedures (including critical care time)  Medications Ordered in ED Medications - No data to display   Initial Impression / Assessment and Plan / ED Course  I have reviewed the triage vital signs and the nursing notes.  Pertinent labs & imaging results that were available during my care of the patient were reviewed by me and considered in my medical decision making (see chart for details).        Patient with what appears to be a folliculitis in his pubic hair.  Does not look like he has an abscess right for drainage.  We will give some antibiotics.  Also has what appears to be a tinea under his foreskin.  Will treat with some topical cream.  May need dermatology follow-up.  Discussed with patient about herpes since he states he has had recurrent lesions.  He states this looks different.  This 1 does not appear herpetic but does not mean other lesions are not herpes.  Will discharge home.  Final Clinical Impressions(s) / ED Diagnoses   Final diagnoses:  Folliculitis  Tinea cruris    ED Discharge Orders         Ordered    doxycycline (VIBRAMYCIN) 100 MG capsule  2 times daily     04/09/18 0840    clotrimazole (LOTRIMIN) 1 % cream     04/09/18 0840           Davonna Belling, MD 04/09/18 [phone]

## 2018-05-10 DIAGNOSIS — Z5181 Encounter for therapeutic drug level monitoring: Secondary | ICD-10-CM | POA: Diagnosis not present

## 2018-05-10 DIAGNOSIS — Z131 Encounter for screening for diabetes mellitus: Secondary | ICD-10-CM | POA: Diagnosis not present

## 2018-05-10 DIAGNOSIS — Z1389 Encounter for screening for other disorder: Secondary | ICD-10-CM | POA: Diagnosis not present

## 2018-05-10 DIAGNOSIS — J45909 Unspecified asthma, uncomplicated: Secondary | ICD-10-CM | POA: Diagnosis not present

## 2018-05-10 DIAGNOSIS — R1013 Epigastric pain: Secondary | ICD-10-CM | POA: Diagnosis not present

## 2018-05-10 DIAGNOSIS — Z01021 Encounter for examination of eyes and vision following failed vision screening with abnormal findings: Secondary | ICD-10-CM | POA: Diagnosis not present

## 2018-05-10 DIAGNOSIS — F418 Other specified anxiety disorders: Secondary | ICD-10-CM | POA: Diagnosis not present

## 2018-05-10 DIAGNOSIS — E1165 Type 2 diabetes mellitus with hyperglycemia: Secondary | ICD-10-CM | POA: Diagnosis not present

## 2018-05-10 DIAGNOSIS — Z0001 Encounter for general adult medical examination with abnormal findings: Secondary | ICD-10-CM | POA: Diagnosis not present

## 2018-05-10 DIAGNOSIS — Z1329 Encounter for screening for other suspected endocrine disorder: Secondary | ICD-10-CM | POA: Diagnosis not present

## 2018-05-10 DIAGNOSIS — Z01118 Encounter for examination of ears and hearing with other abnormal findings: Secondary | ICD-10-CM | POA: Diagnosis not present

## 2018-05-10 DIAGNOSIS — E538 Deficiency of other specified B group vitamins: Secondary | ICD-10-CM | POA: Diagnosis not present

## 2018-05-17 DIAGNOSIS — R1013 Epigastric pain: Secondary | ICD-10-CM | POA: Diagnosis not present

## 2018-05-17 DIAGNOSIS — E1165 Type 2 diabetes mellitus with hyperglycemia: Secondary | ICD-10-CM | POA: Diagnosis not present

## 2018-05-17 DIAGNOSIS — E538 Deficiency of other specified B group vitamins: Secondary | ICD-10-CM | POA: Diagnosis not present

## 2018-05-17 DIAGNOSIS — J45909 Unspecified asthma, uncomplicated: Secondary | ICD-10-CM | POA: Diagnosis not present

## 2018-05-30 DIAGNOSIS — R1013 Epigastric pain: Secondary | ICD-10-CM | POA: Diagnosis not present

## 2018-05-30 DIAGNOSIS — J45909 Unspecified asthma, uncomplicated: Secondary | ICD-10-CM | POA: Diagnosis not present

## 2018-05-30 DIAGNOSIS — L0292 Furuncle, unspecified: Secondary | ICD-10-CM | POA: Diagnosis not present

## 2018-05-30 DIAGNOSIS — E538 Deficiency of other specified B group vitamins: Secondary | ICD-10-CM | POA: Diagnosis not present

## 2018-05-30 DIAGNOSIS — R238 Other skin changes: Secondary | ICD-10-CM | POA: Diagnosis not present

## 2018-05-30 DIAGNOSIS — E1165 Type 2 diabetes mellitus with hyperglycemia: Secondary | ICD-10-CM | POA: Diagnosis not present

## 2018-06-08 ENCOUNTER — Ambulatory Visit: Payer: Medicare Other | Admitting: *Deleted

## 2018-06-27 DIAGNOSIS — E538 Deficiency of other specified B group vitamins: Secondary | ICD-10-CM | POA: Diagnosis not present

## 2018-06-27 DIAGNOSIS — E1165 Type 2 diabetes mellitus with hyperglycemia: Secondary | ICD-10-CM | POA: Diagnosis not present

## 2018-06-27 DIAGNOSIS — R1013 Epigastric pain: Secondary | ICD-10-CM | POA: Diagnosis not present

## 2018-06-27 DIAGNOSIS — J45909 Unspecified asthma, uncomplicated: Secondary | ICD-10-CM | POA: Diagnosis not present

## 2018-08-01 ENCOUNTER — Ambulatory Visit: Payer: Medicare Other | Admitting: *Deleted

## 2018-08-21 DIAGNOSIS — G5602 Carpal tunnel syndrome, left upper limb: Secondary | ICD-10-CM | POA: Diagnosis not present

## 2018-08-21 DIAGNOSIS — G629 Polyneuropathy, unspecified: Secondary | ICD-10-CM | POA: Diagnosis not present

## 2018-09-19 DIAGNOSIS — R1013 Epigastric pain: Secondary | ICD-10-CM | POA: Diagnosis not present

## 2018-09-19 DIAGNOSIS — E1165 Type 2 diabetes mellitus with hyperglycemia: Secondary | ICD-10-CM | POA: Diagnosis not present

## 2018-09-19 DIAGNOSIS — J45909 Unspecified asthma, uncomplicated: Secondary | ICD-10-CM | POA: Diagnosis not present

## 2018-09-19 DIAGNOSIS — E538 Deficiency of other specified B group vitamins: Secondary | ICD-10-CM | POA: Diagnosis not present

## 2018-09-19 DIAGNOSIS — L089 Local infection of the skin and subcutaneous tissue, unspecified: Secondary | ICD-10-CM | POA: Diagnosis not present

## 2018-10-31 DIAGNOSIS — E538 Deficiency of other specified B group vitamins: Secondary | ICD-10-CM | POA: Diagnosis not present

## 2018-10-31 DIAGNOSIS — G629 Polyneuropathy, unspecified: Secondary | ICD-10-CM | POA: Diagnosis not present

## 2018-10-31 DIAGNOSIS — R1013 Epigastric pain: Secondary | ICD-10-CM | POA: Diagnosis not present

## 2018-10-31 DIAGNOSIS — Z113 Encounter for screening for infections with a predominantly sexual mode of transmission: Secondary | ICD-10-CM | POA: Diagnosis not present

## 2018-10-31 DIAGNOSIS — E1165 Type 2 diabetes mellitus with hyperglycemia: Secondary | ICD-10-CM | POA: Diagnosis not present

## 2018-10-31 DIAGNOSIS — L089 Local infection of the skin and subcutaneous tissue, unspecified: Secondary | ICD-10-CM | POA: Diagnosis not present

## 2018-10-31 DIAGNOSIS — J45909 Unspecified asthma, uncomplicated: Secondary | ICD-10-CM | POA: Diagnosis not present

## 2018-12-01 DIAGNOSIS — R1013 Epigastric pain: Secondary | ICD-10-CM | POA: Diagnosis not present

## 2018-12-01 DIAGNOSIS — E1165 Type 2 diabetes mellitus with hyperglycemia: Secondary | ICD-10-CM | POA: Diagnosis not present

## 2018-12-01 DIAGNOSIS — E538 Deficiency of other specified B group vitamins: Secondary | ICD-10-CM | POA: Diagnosis not present

## 2018-12-01 DIAGNOSIS — J45909 Unspecified asthma, uncomplicated: Secondary | ICD-10-CM | POA: Diagnosis not present

## 2018-12-01 DIAGNOSIS — L03313 Cellulitis of chest wall: Secondary | ICD-10-CM | POA: Diagnosis not present

## 2018-12-01 DIAGNOSIS — Z2821 Immunization not carried out because of patient refusal: Secondary | ICD-10-CM | POA: Diagnosis not present

## 2018-12-01 DIAGNOSIS — G629 Polyneuropathy, unspecified: Secondary | ICD-10-CM | POA: Diagnosis not present

## 2018-12-03 DIAGNOSIS — Z0000 Encounter for general adult medical examination without abnormal findings: Secondary | ICD-10-CM | POA: Diagnosis not present

## 2018-12-03 DIAGNOSIS — Z711 Person with feared health complaint in whom no diagnosis is made: Secondary | ICD-10-CM | POA: Diagnosis not present

## 2019-01-10 IMAGING — CR DG CHEST 2V
2 series · 2 of 2 positions shown · non-contrast
Comparison: 07/29/2015, CT 09/27/2014

CLINICAL DATA: Syncope with shortness of breath

EXAM:
CHEST  2 VIEW

[w chest lat]
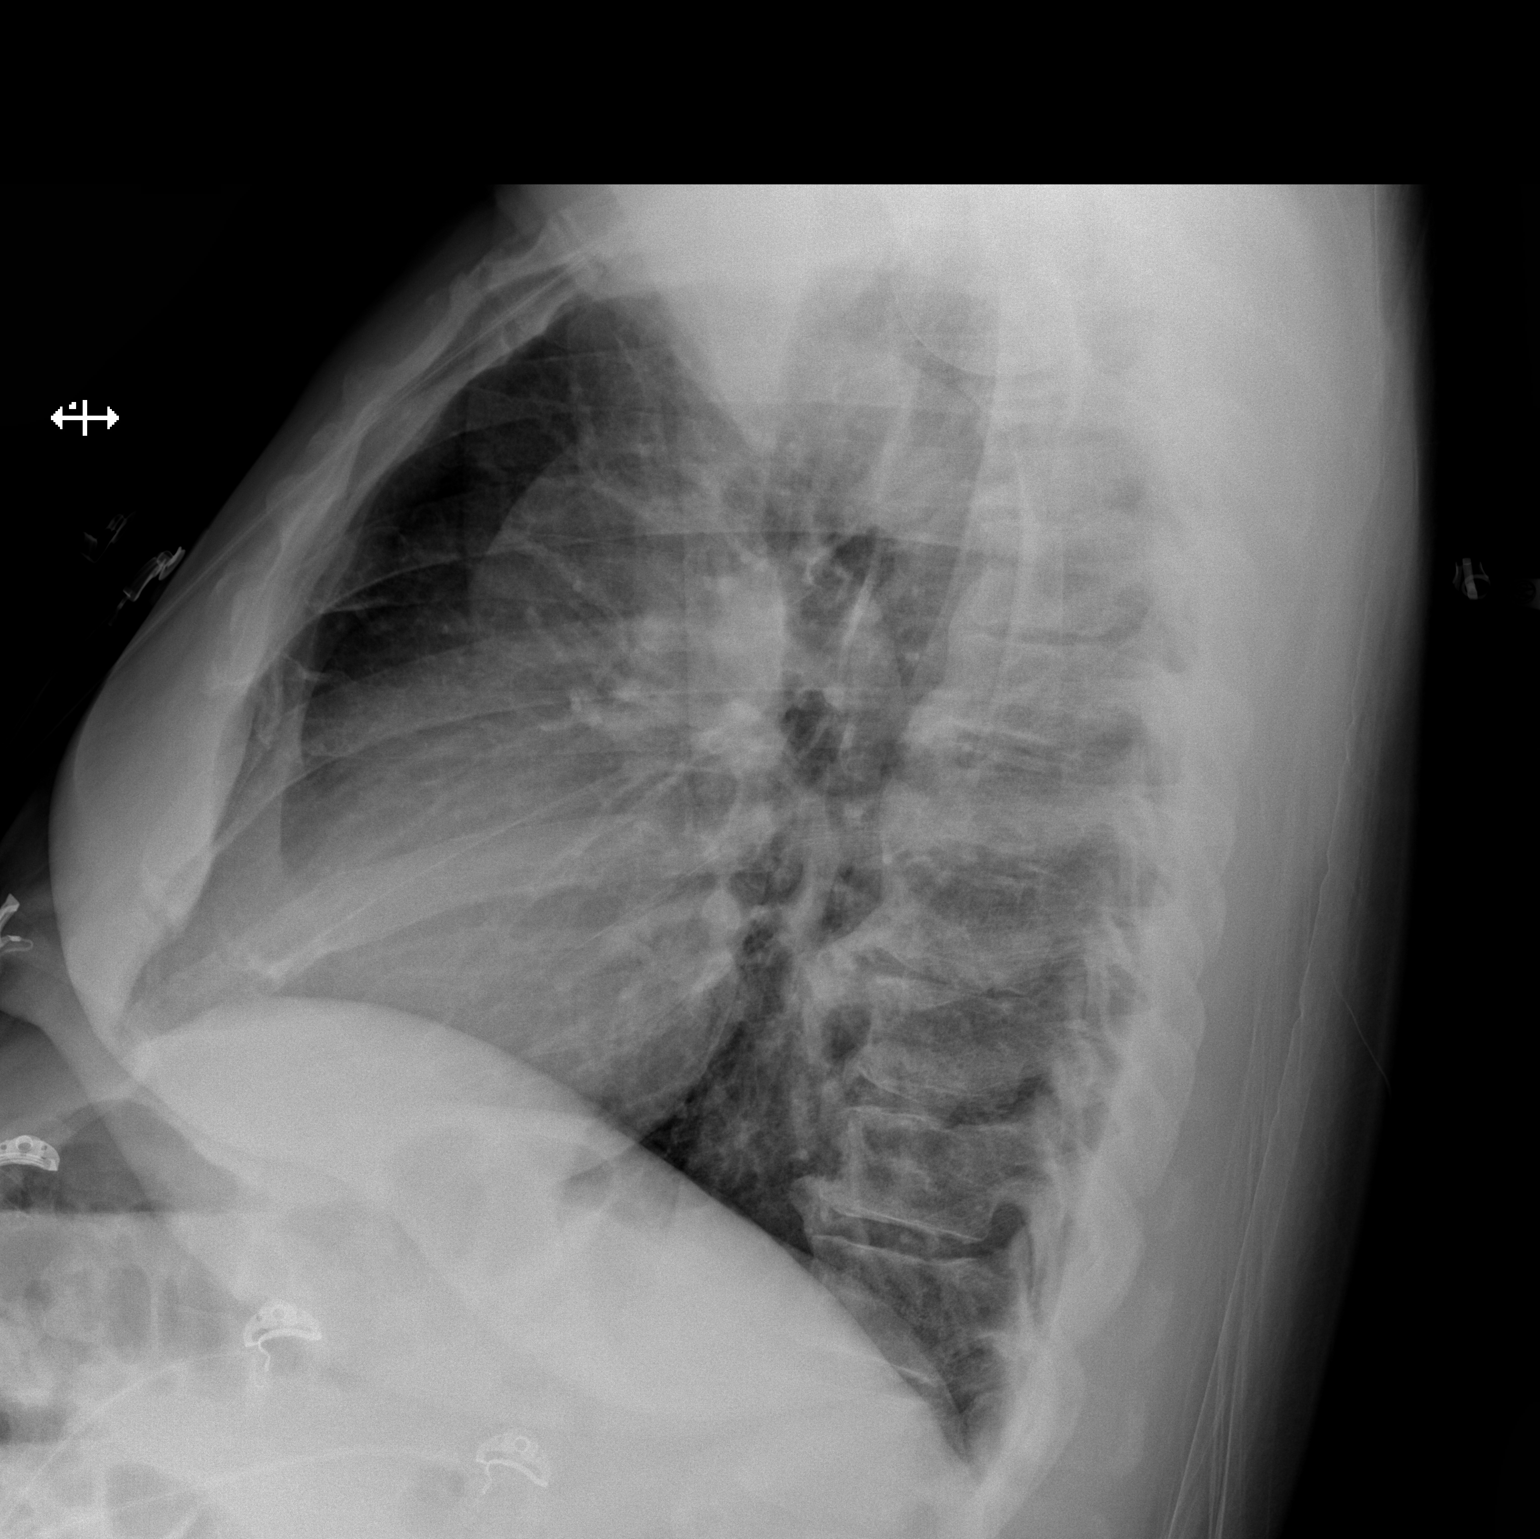

[x chest ap]
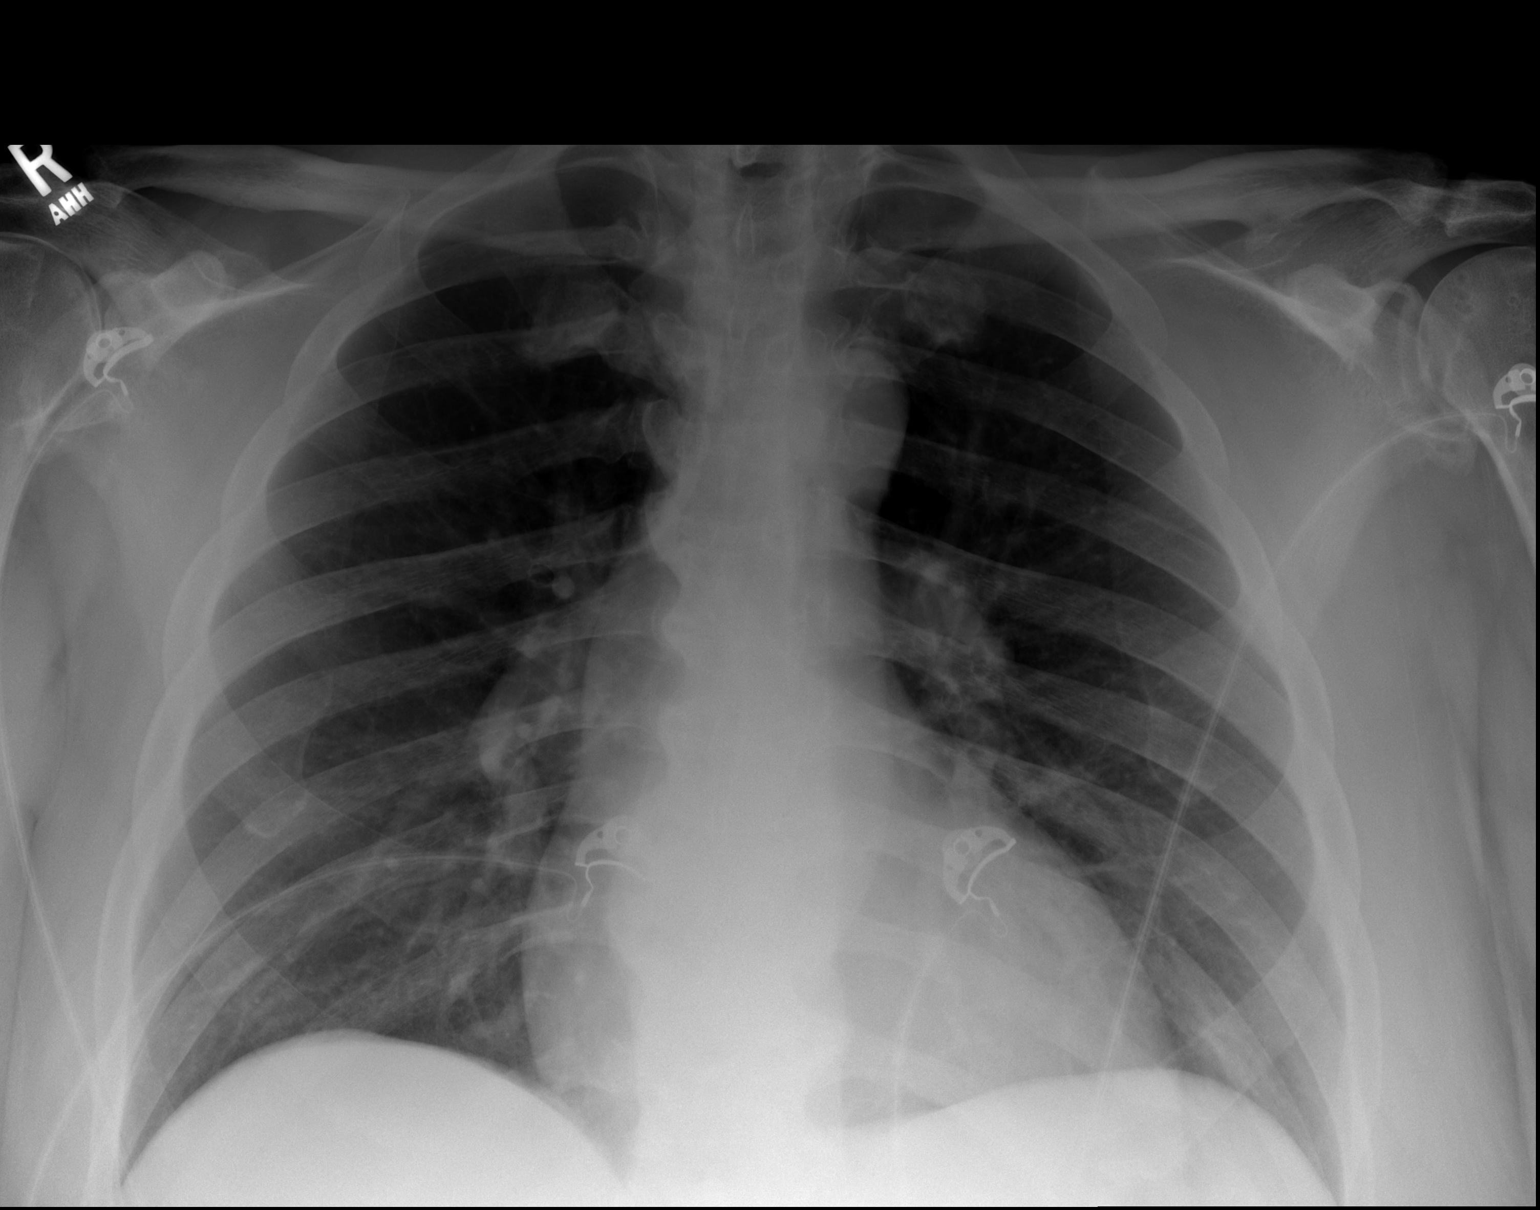

[2 of 2 positions shown; findings below may reference images not displayed]

FINDINGS: Borderline cardiomegaly. No edema, infiltrate or effusion. No
pneumothorax.
IMPRESSION: Borderline to mild cardiomegaly.  No edema or infiltrate.

## 2019-05-12 DIAGNOSIS — Z23 Encounter for immunization: Secondary | ICD-10-CM | POA: Diagnosis not present

## 2019-06-09 DIAGNOSIS — Z23 Encounter for immunization: Secondary | ICD-10-CM | POA: Diagnosis not present

## 2019-10-04 ENCOUNTER — Encounter (HOSPITAL_BASED_OUTPATIENT_CLINIC_OR_DEPARTMENT_OTHER): Payer: Self-pay

## 2019-10-04 ENCOUNTER — Other Ambulatory Visit: Payer: Self-pay

## 2019-10-04 ENCOUNTER — Emergency Department (HOSPITAL_BASED_OUTPATIENT_CLINIC_OR_DEPARTMENT_OTHER): Payer: 59

## 2019-10-04 ENCOUNTER — Emergency Department (HOSPITAL_BASED_OUTPATIENT_CLINIC_OR_DEPARTMENT_OTHER)
Admission: EM | Admit: 2019-10-04 | Discharge: 2019-10-04 | Disposition: A | Discharge status: Home / Self Care | Payer: 59 | Attending: Emergency Medicine | Admitting: Emergency Medicine

## 2019-10-04 DIAGNOSIS — E1165 Type 2 diabetes mellitus with hyperglycemia: Secondary | ICD-10-CM | POA: Diagnosis not present

## 2019-10-04 DIAGNOSIS — F17210 Nicotine dependence, cigarettes, uncomplicated: Secondary | ICD-10-CM | POA: Diagnosis not present

## 2019-10-04 DIAGNOSIS — N481 Balanitis: Secondary | ICD-10-CM | POA: Diagnosis not present

## 2019-10-04 DIAGNOSIS — R42 Dizziness and giddiness: Secondary | ICD-10-CM

## 2019-10-04 DIAGNOSIS — N528 Other male erectile dysfunction: Secondary | ICD-10-CM | POA: Diagnosis not present

## 2019-10-04 DIAGNOSIS — R739 Hyperglycemia, unspecified: Secondary | ICD-10-CM

## 2019-10-04 DIAGNOSIS — Z7984 Long term (current) use of oral hypoglycemic drugs: Secondary | ICD-10-CM | POA: Diagnosis not present

## 2019-10-04 DIAGNOSIS — Z8614 Personal history of Methicillin resistant Staphylococcus aureus infection: Secondary | ICD-10-CM | POA: Diagnosis not present

## 2019-10-04 DIAGNOSIS — J45909 Unspecified asthma, uncomplicated: Secondary | ICD-10-CM | POA: Diagnosis not present

## 2019-10-04 LAB — CBC
HCT: 44.9 % (ref 39.0–52.0)
Hemoglobin: 15.2 g/dL (ref 13.0–17.0)
MCH: 26.1 pg (ref 26.0–34.0)
MCHC: 33.9 g/dL (ref 30.0–36.0)
MCV: 77.1 fL — ABNORMAL LOW (ref 80.0–100.0)
Platelets: 164 10*3/uL (ref 150–400)
RBC: 5.82 MIL/uL — ABNORMAL HIGH (ref 4.22–5.81)
RDW: 13.3 % (ref 11.5–15.5)
WBC: 6.6 10*3/uL (ref 4.0–10.5)
nRBC: 0 % (ref 0.0–0.2)

## 2019-10-04 LAB — COMPREHENSIVE METABOLIC PANEL
ALT: 26 U/L (ref 0–44)
AST: 19 U/L (ref 15–41)
Albumin: 3.5 g/dL (ref 3.5–5.0)
Alkaline Phosphatase: 118 U/L (ref 38–126)
Anion gap: 11 (ref 5–15)
BUN: 23 mg/dL (ref 8–23)
CO2: 24 mmol/L (ref 22–32)
Calcium: 8.9 mg/dL (ref 8.9–10.3)
Chloride: 96 mmol/L — ABNORMAL LOW (ref 98–111)
Creatinine, Ser: 1.28 mg/dL — ABNORMAL HIGH (ref 0.61–1.24)
GFR calc Af Amer: >60 mL/min (ref >60)
GFR calc non Af Amer: 59 mL/min — ABNORMAL LOW (ref >60)
Glucose, Bld: 508 mg/dL (ref 70–99)
Potassium: 4.6 mmol/L (ref 3.5–5.1)
Sodium: 131 mmol/L — ABNORMAL LOW (ref 135–145)
Total Bilirubin: 0.5 mg/dL (ref 0.3–1.2)
Total Protein: 7 g/dL (ref 6.5–8.1)

## 2019-10-04 LAB — DIFFERENTIAL
Abs Immature Granulocytes: 0.02 10*3/uL (ref 0.00–0.07)
Basophils Absolute: 0 10*3/uL (ref 0.0–0.1)
Basophils Relative: 0 %
Eosinophils Absolute: 0 10*3/uL (ref 0.0–0.5)
Eosinophils Relative: 1 %
Immature Granulocytes: 0 %
Lymphocytes Relative: 36 %
Lymphs Abs: 2.4 10*3/uL (ref 0.7–4.0)
Monocytes Absolute: 0.4 10*3/uL (ref 0.1–1.0)
Monocytes Relative: 6 %
Neutro Abs: 3.8 10*3/uL (ref 1.7–7.7)
Neutrophils Relative %: 57 %

## 2019-10-04 LAB — CBG MONITORING, ED
Glucose-Capillary: 509 mg/dL (ref 70–99)
Glucose-Capillary: 414 mg/dL — ABNORMAL HIGH (ref 70–99)
Glucose-Capillary: 321 mg/dL — ABNORMAL HIGH (ref 70–99)

## 2019-10-04 LAB — APTT: aPTT: 30 seconds (ref 24–36)

## 2019-10-04 LAB — PROTIME-INR
INR: 0.9 (ref 0.8–1.2)
Prothrombin Time: 12.2 seconds (ref 11.4–15.2)

## 2019-10-04 MED ORDER — INSULIN ASPART 100 UNIT/ML ~~LOC~~ SOLN
SUBCUTANEOUS | Status: AC
  Administered 2019-10-04: 10 [IU] via SUBCUTANEOUS
  Filled 2019-10-04: qty 10

## 2019-10-04 MED ORDER — INSULIN ASPART 100 UNIT/ML ~~LOC~~ SOLN: 10.0000 [IU] | Freq: Once | SUBCUTANEOUS | Status: AC

## 2019-10-04 MED ORDER — METFORMIN HCL 1000 MG PO TABS: 1000.0000 mg | ORAL_TABLET | Freq: Two times a day (BID) | ORAL | 0 refills | Status: DC

## 2019-10-04 MED ORDER — SODIUM CHLORIDE 0.9 % IV BOLUS
1000.0000 mL | Freq: Once | INTRAVENOUS | Status: AC
  Administered 2019-10-04: 1000 mL via INTRAVENOUS

## 2019-10-04 MED ORDER — INSULIN REGULAR HUMAN 100 UNIT/ML IJ SOLN
10.0000 [IU] | Freq: Once | INTRAMUSCULAR | Status: DC
  Filled 2019-10-04: qty 3

## 2019-10-04 MED ORDER — INSULIN NPH (HUMAN) (ISOPHANE) 100 UNIT/ML ~~LOC~~ SUSP
SUBCUTANEOUS | Status: AC
  Filled 2019-10-04: qty 10

## 2019-10-04 NOTE — ED Provider Notes (Signed)
[facility] EMERGENCY DEPARTMENT Provider Note  CSN: [id number] Arrival date & time: 10/04/19 1917    History Chief Complaint  Patient presents with  . Dizziness    HPI  Lee Reid is a 64 y.o. male with history of DM not taking any medications for several months reports polyuria and polydipsia. During the night last night while getting up to urinate several times he felt off balance and dizzy. Symptoms persisted during the day today. He has had similar symptoms with blood glucose is elevated and so he went to his PCP. He reports blood sugar was not checked while he was there but he was referred to a diabetes specialist. He then decided to come to the ED for evaluation. Denies any falls or head injury. No numbness/weakness in extremities. No N/V/D. No CP, SOB or fever.    Past Medical History:  Diagnosis Date  . Anginal pain ([place])   . Anxiety   . Asthma   . Chronic back pain   . Depression   . Diabetes mellitus without complication ([place])   . Dysrhythmia    irregular heartbeat due to stress  . GERD (gastroesophageal reflux disease)   . Heart murmur    born with heart murmur  . Pneumonia     Past Surgical History:  Procedure Laterality Date  . CHOLECYSTECTOMY N/A 09/16/2014   Procedure: LAPAROSCOPIC CHOLECYSTECTOMY ;  Surgeon: Arta Bruce Kinsinger, MD;  Location: [facility];  Service: General;  Laterality: N/A;  . EYE SURGERY    . TESTICLE SURGERY     growth removed  . TONSILLECTOMY      Family History  Problem Relation Age of Onset  . Hypertension Mother   . Heart disease Father   . Cancer Father     Social History   Tobacco Use  . Smoking status: Current Every Day Smoker    Packs/day: 0.25    Types: Cigarettes  . Smokeless tobacco: Never Used  Vaping Use  . Vaping Use: Never used  Substance Use Topics  . Alcohol use: Yes    Comment: occ  . Drug use: No     Home Medications Prior to Admission medications   Medication Sig Start Date End  Date Taking? Authorizing Provider  celecoxib (CELEBREX) 200 MG capsule Take 1 capsule (200 mg total) by mouth 2 (two) times daily. 03/28/18   Isla Pence, MD  clotrimazole (LOTRIMIN) 1 % cream Apply to affected area 2 times daily 04/09/18   Davonna Belling, MD  doxycycline (VIBRAMYCIN) 100 MG capsule Take 1 capsule (100 mg total) by mouth 2 (two) times daily. 04/09/18   Davonna Belling, MD  HYDROcodone-acetaminophen (NORCO/VICODIN) 5-325 MG tablet Take 1 tablet by mouth every 4 (four) hours as needed. 03/28/18   Isla Pence, MD  magic mouthwash SOLN Take 5 mLs by mouth 3 (three) times daily as needed for mouth pain. 04/09/18   Long, Wonda Olds, MD  metFORMIN (GLUCOPHAGE) 1000 MG tablet Take 1 tablet (1,000 mg total) by mouth 2 (two) times daily. 10/04/19   Truddie Hidden, MD     Allergies    Bee venom   Review of Systems   Review of Systems A comprehensive review of systems was completed and negative except as noted in HPI.    Physical Exam BP 128/77   Pulse 91   Temp 98.1 F (36.7 C) (Oral)   Resp 20   Ht 6\' 1"  (1.854 m)   Wt 122.5 kg   SpO2 93%  BMI 35.62 kg/m   Physical Exam Vitals and nursing note reviewed.  Constitutional:      Appearance: Normal appearance.  HENT:     Head: Normocephalic and atraumatic.     Nose: Nose normal.     Mouth/Throat:     Mouth: Mucous membranes are dry.  Eyes:     Extraocular Movements: Extraocular movements intact.     Conjunctiva/sclera: Conjunctivae normal.  Cardiovascular:     Rate and Rhythm: Normal rate.  Pulmonary:     Effort: Pulmonary effort is normal.     Breath sounds: Normal breath sounds.  Abdominal:     General: Abdomen is flat.     Palpations: Abdomen is soft.     Tenderness: There is no abdominal tenderness.  Musculoskeletal:        General: No swelling. Normal range of motion.     Cervical back: Neck supple.  Skin:    General: Skin is warm and dry.  Neurological:     General: No focal deficit present.      Mental Status: He is alert.     Cranial Nerves: No cranial nerve deficit.     Sensory: No sensory deficit.     Motor: No weakness.     Coordination: Coordination normal.  Psychiatric:        Mood and Affect: Mood normal.      ED Results / Procedures / Treatments   Labs (all labs ordered are listed, but only abnormal results are displayed) Labs Reviewed  CBC - Abnormal; Notable for the following components:      Result Value   RBC 5.82 (*)    MCV 77.1 (*)    All other components within normal limits  COMPREHENSIVE METABOLIC PANEL - Abnormal; Notable for the following components:   Sodium 131 (*)    Chloride 96 (*)    Glucose, Bld 508 (*)    Creatinine, Ser 1.28 (*)    GFR calc non Af Amer 59 (*)    All other components within normal limits  CBG MONITORING, ED - Abnormal; Notable for the following components:   Glucose-Capillary 509 (*)    All other components within normal limits  CBG MONITORING, ED - Abnormal; Notable for the following components:   Glucose-Capillary 414 (*)    All other components within normal limits  CBG MONITORING, ED - Abnormal; Notable for the following components:   Glucose-Capillary 321 (*)    All other components within normal limits  PROTIME-INR  APTT  DIFFERENTIAL    EKG EKG Interpretation  Date/Time:  Thursday October 04 2019 19:28:02 EDT Ventricular Rate:  96 PR Interval:  142 QRS Duration: 130 QT Interval:  376 QTC Calculation: 475 R Axis:   -64 Text Interpretation: Normal sinus rhythm Left axis deviation Right bundle branch block Possible Lateral infarct , age undetermined Abnormal ECG Since last tracing Rate faster Confirmed by Calvert Cantor [phone]) on 10/04/2019 7:55:14 PM    Radiology CT HEAD WO CONTRAST  Result Date: 10/04/2019 CLINICAL DATA:  Dizziness EXAM: CT HEAD WITHOUT CONTRAST TECHNIQUE: Contiguous axial images were obtained from the base of the skull through the vertex without intravenous contrast.  COMPARISON:  CT brain 07/29/2015 FINDINGS: Brain: No acute territorial infarction, hemorrhage or intracranial mass. Mild hypodensity in the white matter consistent with chronic small vessel ischemic change. Stable ventricle size Vascular: No hyperdense vessels. Scattered carotid vascular calcification. Skull: Normal. Negative for fracture or focal lesion. Sinuses/Orbits: Hyperdense left globe as before. Atrophic appearing left optic  nerve. Other: None IMPRESSION: 1. No CT evidence for acute intracranial abnormality. 2. Mild chronic small vessel ischemic changes of the white matter. Electronically Signed   By: Donavan Foil M.D.   On: 10/04/2019 20:14    Procedures Procedures  Medications Ordered in the ED Medications  sodium chloride 0.9 % bolus 1,000 mL (0 mLs Intravenous Stopped 10/04/19 2136)  insulin aspart (novoLOG) injection 10 Units (10 Units Subcutaneous Given 10/04/19 2114)     MDM Rules/Calculators/A&P MDM Patient's CBG is elevated >500 here. Will give IVF, check labs. Dizziness is nonspecific, some features of vertigo but not consistently so. No concern for acute stroke. ED Course  I have reviewed the triage vital signs and the nursing notes.  Pertinent labs & imaging results that were available during my care of the patient were reviewed by me and considered in my medical decision making (see chart for details).  Clinical Course as of Oct 04 2330  Thu Oct 04, 2019  2103 CMP confirms elevated glucose, no signs of DKA. Will give SQ insulin, IVF and reassess.    [CS]  2145 Glucose improving. Will continue to monitor.    [CS]  2330 Patient's CBG improving. Patient reports resolution of dizziness and wants to go home. Will restart Metformin recommend PCP followup for long term management of DM.    [CS]    Clinical Course User Index [CS] Truddie Hidden, MD    Final Clinical Impression(s) / ED Diagnoses Final diagnoses:  Dizziness  Hyperglycemia    Rx / DC Orders ED  Discharge Orders         Ordered    metFORMIN (GLUCOPHAGE) 1000 MG tablet  2 times daily        10/04/19 2331           Truddie Hidden, MD 10/04/19 2332

## 2019-10-04 NOTE — ED Notes (Signed)
Date and time results received: 10/04/19 2045  Test: glucose Critical Value: 508  Name of Provider Notified: Dr. Karle Starch  Orders Received? Or Actions Taken?: no new orders

## 2019-10-04 NOTE — ED Triage Notes (Signed)
Pt states he woke up this AM with a constant dizziness and this afternoon it turned to an intermittent dizziness. Pt reports he had no dizziness when he went to bed at 23:00 last PM. Pt reports associated nausea.

## 2019-10-24 ENCOUNTER — Encounter (HOSPITAL_BASED_OUTPATIENT_CLINIC_OR_DEPARTMENT_OTHER): Payer: Self-pay | Admitting: Emergency Medicine

## 2019-10-24 ENCOUNTER — Emergency Department (HOSPITAL_BASED_OUTPATIENT_CLINIC_OR_DEPARTMENT_OTHER)
Admission: EM | Admit: 2019-10-24 | Discharge: 2019-10-24 | Disposition: A | Discharge status: Home / Self Care | Payer: 59 | Attending: Emergency Medicine | Admitting: Emergency Medicine

## 2019-10-24 ENCOUNTER — Other Ambulatory Visit: Payer: Self-pay

## 2019-10-24 DIAGNOSIS — R519 Headache, unspecified: Secondary | ICD-10-CM | POA: Diagnosis not present

## 2019-10-24 DIAGNOSIS — F17210 Nicotine dependence, cigarettes, uncomplicated: Secondary | ICD-10-CM | POA: Insufficient documentation

## 2019-10-24 DIAGNOSIS — K029 Dental caries, unspecified: Secondary | ICD-10-CM | POA: Insufficient documentation

## 2019-10-24 DIAGNOSIS — J45909 Unspecified asthma, uncomplicated: Secondary | ICD-10-CM | POA: Insufficient documentation

## 2019-10-24 DIAGNOSIS — K0889 Other specified disorders of teeth and supporting structures: Secondary | ICD-10-CM | POA: Diagnosis present

## 2019-10-24 DIAGNOSIS — Z7984 Long term (current) use of oral hypoglycemic drugs: Secondary | ICD-10-CM | POA: Diagnosis not present

## 2019-10-24 DIAGNOSIS — E119 Type 2 diabetes mellitus without complications: Secondary | ICD-10-CM | POA: Diagnosis not present

## 2019-10-24 LAB — CBG MONITORING, ED: Glucose-Capillary: 378 mg/dL — ABNORMAL HIGH (ref 70–99)

## 2019-10-24 MED ORDER — KETOROLAC TROMETHAMINE 60 MG/2ML IM SOLN
30.0000 mg | Freq: Once | INTRAMUSCULAR | Status: AC
  Administered 2019-10-24: 30 mg via INTRAMUSCULAR
  Filled 2019-10-24: qty 2

## 2019-10-24 MED ORDER — MELOXICAM 7.5 MG PO TABS
3.7500 mg | ORAL_TABLET | Freq: Every day | ORAL | 0 refills | Status: AC
Start: 2019-10-24 — End: 2019-10-31

## 2019-10-24 MED ORDER — PENICILLIN V POTASSIUM 500 MG PO TABS: 500.0000 mg | ORAL_TABLET | Freq: Four times a day (QID) | ORAL | 0 refills | Status: AC

## 2019-10-24 MED ORDER — PENICILLIN V POTASSIUM 250 MG PO TABS
500.0000 mg | ORAL_TABLET | Freq: Once | ORAL | Status: AC
  Administered 2019-10-24: 500 mg via ORAL
  Filled 2019-10-24: qty 2

## 2019-10-24 MED ORDER — ALUM & MAG HYDROXIDE-SIMETH 200-200-20 MG/5ML PO SUSP
30.0000 mL | Freq: Once | ORAL | Status: AC
  Administered 2019-10-24: 30 mL via ORAL
  Filled 2019-10-24: qty 30

## 2019-10-24 NOTE — ED Notes (Signed)
Pt request meds for reflux

## 2019-10-24 NOTE — ED Triage Notes (Addendum)
Pt states he has several abscesses on his body  Pt states he had one on the back of his head  Pt states it drained on the inside of his head instead of the outside  Pt states it has made his whole head hurt  Pt states he can taste pus in his mouth  Pt states he feels like something is crawling around on the inside of his head  Pt states he has been using advil at home without any pain relief  Pt adds that he has been having sharp abd pain off and on since Monday night

## 2019-10-24 NOTE — ED Notes (Signed)
Pt states has pus and blood drainage from a tooth since this morning, had a abscess in the back of his neck that "busted on the inside and made it around the top of his head to his face."

## 2019-10-24 NOTE — ED Provider Notes (Signed)
[facility] EMERGENCY DEPARTMENT Provider Note  CSN: [id number] Arrival date & time: 10/24/19 0150  Chief Complaint(s) Headache  HPI Lee Reid is a 64 y.o. male here with dental pain of the right upper premolar, severe throbbing, radiating to the rest of his head.  Worse with palpation.  Believes it is infected.  Reports that he has sensation of things crawling around his head from the pain.  No fevers or chills.  Pain not relieved with 400 mg of ibuprofen.  Patient also reports having had a small abscess on his occipital scalp which he drained.  No other physical complaints.  HPI  Past Medical History Past Medical History:  Diagnosis Date  . Anginal pain ([place])   . Anxiety   . Asthma   . Chronic back pain   . Depression   . Diabetes mellitus without complication ([place])   . Dysrhythmia    irregular heartbeat due to stress  . GERD (gastroesophageal reflux disease)   . Heart murmur    born with heart murmur  . Pneumonia    Patient Active Problem List   Diagnosis Date Noted  . Peripheral arterial disease ([place]) 07/15/2017  . ARF (acute renal failure) ([place]) 09/28/2014  . Abdominal wall cellulitis 09/27/2014  . Cellulitis of abdominal wall 09/27/2014  . S/P cholecystectomy 09/27/2014  . Chronic cholecystitis 09/16/2014  . Preoperative clearance 08/29/2014  . Type 2 diabetes mellitus without complication ([place]) 76/28/3151  . Type 2 diabetes mellitus without complications ([place]) 76/16/0737  . Morbid obesity ([place]) 07/29/2014  . Colon cancer screening 07/29/2014   Home Medication(s) Prior to Admission medications   Medication Sig Start Date End Date Taking? Authorizing Provider  celecoxib (CELEBREX) 200 MG capsule Take 1 capsule (200 mg total) by mouth 2 (two) times daily. 03/28/18   Isla Pence, MD  clotrimazole (LOTRIMIN) 1 % cream Apply to affected area 2 times daily 04/09/18   Davonna Belling, MD  doxycycline (VIBRAMYCIN) 100 MG capsule Take 1 capsule (100  mg total) by mouth 2 (two) times daily. 04/09/18   Davonna Belling, MD  HYDROcodone-acetaminophen (NORCO/VICODIN) 5-325 MG tablet Take 1 tablet by mouth every 4 (four) hours as needed. 03/28/18   Isla Pence, MD  magic mouthwash SOLN Take 5 mLs by mouth 3 (three) times daily as needed for mouth pain. 04/09/18   Long, Wonda Olds, MD  meloxicam (MOBIC) 7.5 MG tablet Take 0.5-1 tablets (3.75-7.5 mg total) by mouth daily for 7 days. 10/24/19 10/31/19  Fatima Blank, MD  metFORMIN (GLUCOPHAGE) 1000 MG tablet Take 1 tablet (1,000 mg total) by mouth 2 (two) times daily. 10/04/19   Truddie Hidden, MD  penicillin v potassium (VEETID) 500 MG tablet Take 1 tablet (500 mg total) by mouth 4 (four) times daily for 7 days. 10/24/19 10/31/19  Fatima Blank, MD  Past Surgical History Past Surgical History:  Procedure Laterality Date  . CHOLECYSTECTOMY N/A 09/16/2014   Procedure: LAPAROSCOPIC CHOLECYSTECTOMY ;  Surgeon: Arta Bruce Kinsinger, MD;  Location: [facility];  Service: General;  Laterality: N/A;  . EYE SURGERY    . TESTICLE SURGERY     growth removed  . TONSILLECTOMY     Family History Family History  Problem Relation Age of Onset  . Hypertension Mother   . Heart disease Father   . Cancer Father     Social History Social History   Tobacco Use  . Smoking status: Current Every Day Smoker    Packs/day: 0.25    Types: Cigarettes  . Smokeless tobacco: Never Used  Vaping Use  . Vaping Use: Never used  Substance Use Topics  . Alcohol use: Yes    Comment: occ  . Drug use: No   Allergies Bee venom  Review of Systems Review of Systems All other systems are reviewed and are negative for acute change except as noted in the HPI  Physical Exam Vital Signs  I have reviewed the triage vital signs BP (!) 170/110 (BP Location: Right Arm)   Pulse (!)  103   Temp 98.5 F (36.9 C) (Oral)   Resp 18   Ht 6\' 1"  (1.854 m)   Wt 124.7 kg   SpO2 97%   BMI 36.28 kg/m   Physical Exam Vitals reviewed.  Constitutional:      General: He is not in acute distress.    Appearance: He is well-developed. He is not diaphoretic.  HENT:     Head: Normocephalic and atraumatic.     Jaw: No trismus.      Right Ear: External ear normal.     Left Ear: External ear normal.     Nose: Nose normal.     Mouth/Throat:     Lips: No lesions.     Mouth: No oral lesions.     Dentition: Abnormal dentition. Dental caries present.      Comments: Numerous missing teeth. Eyes:     General: No scleral icterus.    Conjunctiva/sclera: Conjunctivae normal.  Neck:     Trachea: Phonation normal.  Cardiovascular:     Rate and Rhythm: Normal rate and regular rhythm.  Pulmonary:     Effort: Pulmonary effort is normal. No respiratory distress.     Breath sounds: No stridor.  Abdominal:     General: There is no distension.  Musculoskeletal:        General: Normal range of motion.     Cervical back: Normal range of motion.  Neurological:     Mental Status: He is alert and oriented to person, place, and time.  Psychiatric:        Behavior: Behavior normal.     ED Results and Treatments Labs (all labs ordered are listed, but only abnormal results are displayed) Labs Reviewed  CBG MONITORING, ED - Abnormal; Notable for the following components:      Result Value   Glucose-Capillary 378 (*)    All other components within normal limits  EKG  EKG Interpretation  Date/Time:    Ventricular Rate:    PR Interval:    QRS Duration:   QT Interval:    QTC Calculation:   R Axis:     Text Interpretation:        Radiology No results found.  Pertinent labs & imaging results that were available during my care of the patient were reviewed by me  and considered in my medical decision making (see chart for details).  Medications Ordered in ED Medications  alum & mag hydroxide-simeth (MAALOX/MYLANTA) 200-200-20 MG/5ML suspension 30 mL (30 mLs Oral Given 10/24/19 0418)  ketorolac (TORADOL) injection 30 mg (30 mg Intramuscular Given 10/24/19 0612)  penicillin v potassium (VEETID) tablet 500 mg (500 mg Oral Given 10/24/19 0615)                                                                                                                                    Procedures Procedures  (including critical care time)  Medical Decision Making / ED Course I have reviewed the nursing notes for this encounter and the patient's prior records (if available in EHR or on provided paperwork).   Lee Reid was evaluated in Emergency Department on 10/24/2019 for the symptoms described in the history of present illness. He was evaluated in the context of the global COVID-19 pandemic, which necessitated consideration that the patient might be at risk for infection with the SARS-CoV-2 virus that causes COVID-19. Institutional protocols and algorithms that pertain to the evaluation of patients at risk for COVID-19 are in a state of rapid change based on information released by regulatory bodies including the CDC and federal and state organizations. These policies and algorithms were followed during the patient's care in the ED.  Dental pain. No obvious drainanble abscess. toradol for pain. Pen VK.      Final Clinical Impression(s) / ED Diagnoses Final diagnoses:  Pain, dental    The patient appears reasonably screened and/or stabilized for discharge and I doubt any other medical condition or other [facility] requiring further screening, evaluation, or treatment in the ED at this time prior to discharge. Safe for discharge with strict return precautions.  Disposition: Discharge  Condition: Good  I have discussed the results, Dx and Tx plan with the  patient/family who expressed understanding and agree(s) with the plan. Discharge instructions discussed at length. The patient/family was given strict return precautions who verbalized understanding of the instructions. No further questions at time of discharge.    ED Discharge Orders         Ordered    meloxicam (MOBIC) 7.5 MG tablet  Daily        10/24/19 0644    penicillin v potassium (VEETID) 500 MG tablet  4 times daily        10/24/19 6270            Follow Up: Benito Mccreedy, [address] [place] [id number] [phone]     Dentist  Go to       This chart was dictated using voice recognition software.  Despite best efforts to proofread,  errors can occur which can change the documentation meaning.   Fatima Blank, MD 10/24/19 2124

## 2019-12-21 IMAGING — CR DG CHEST 2V
2 series · 2 of 2 positions shown · non-contrast
Comparison: Chest x-ray dated June 09, 2016.

CLINICAL DATA: Left-sided chest pain with shortness of breath and
productive cough.

EXAM:
CHEST - 2 VIEW

[w chest pa]
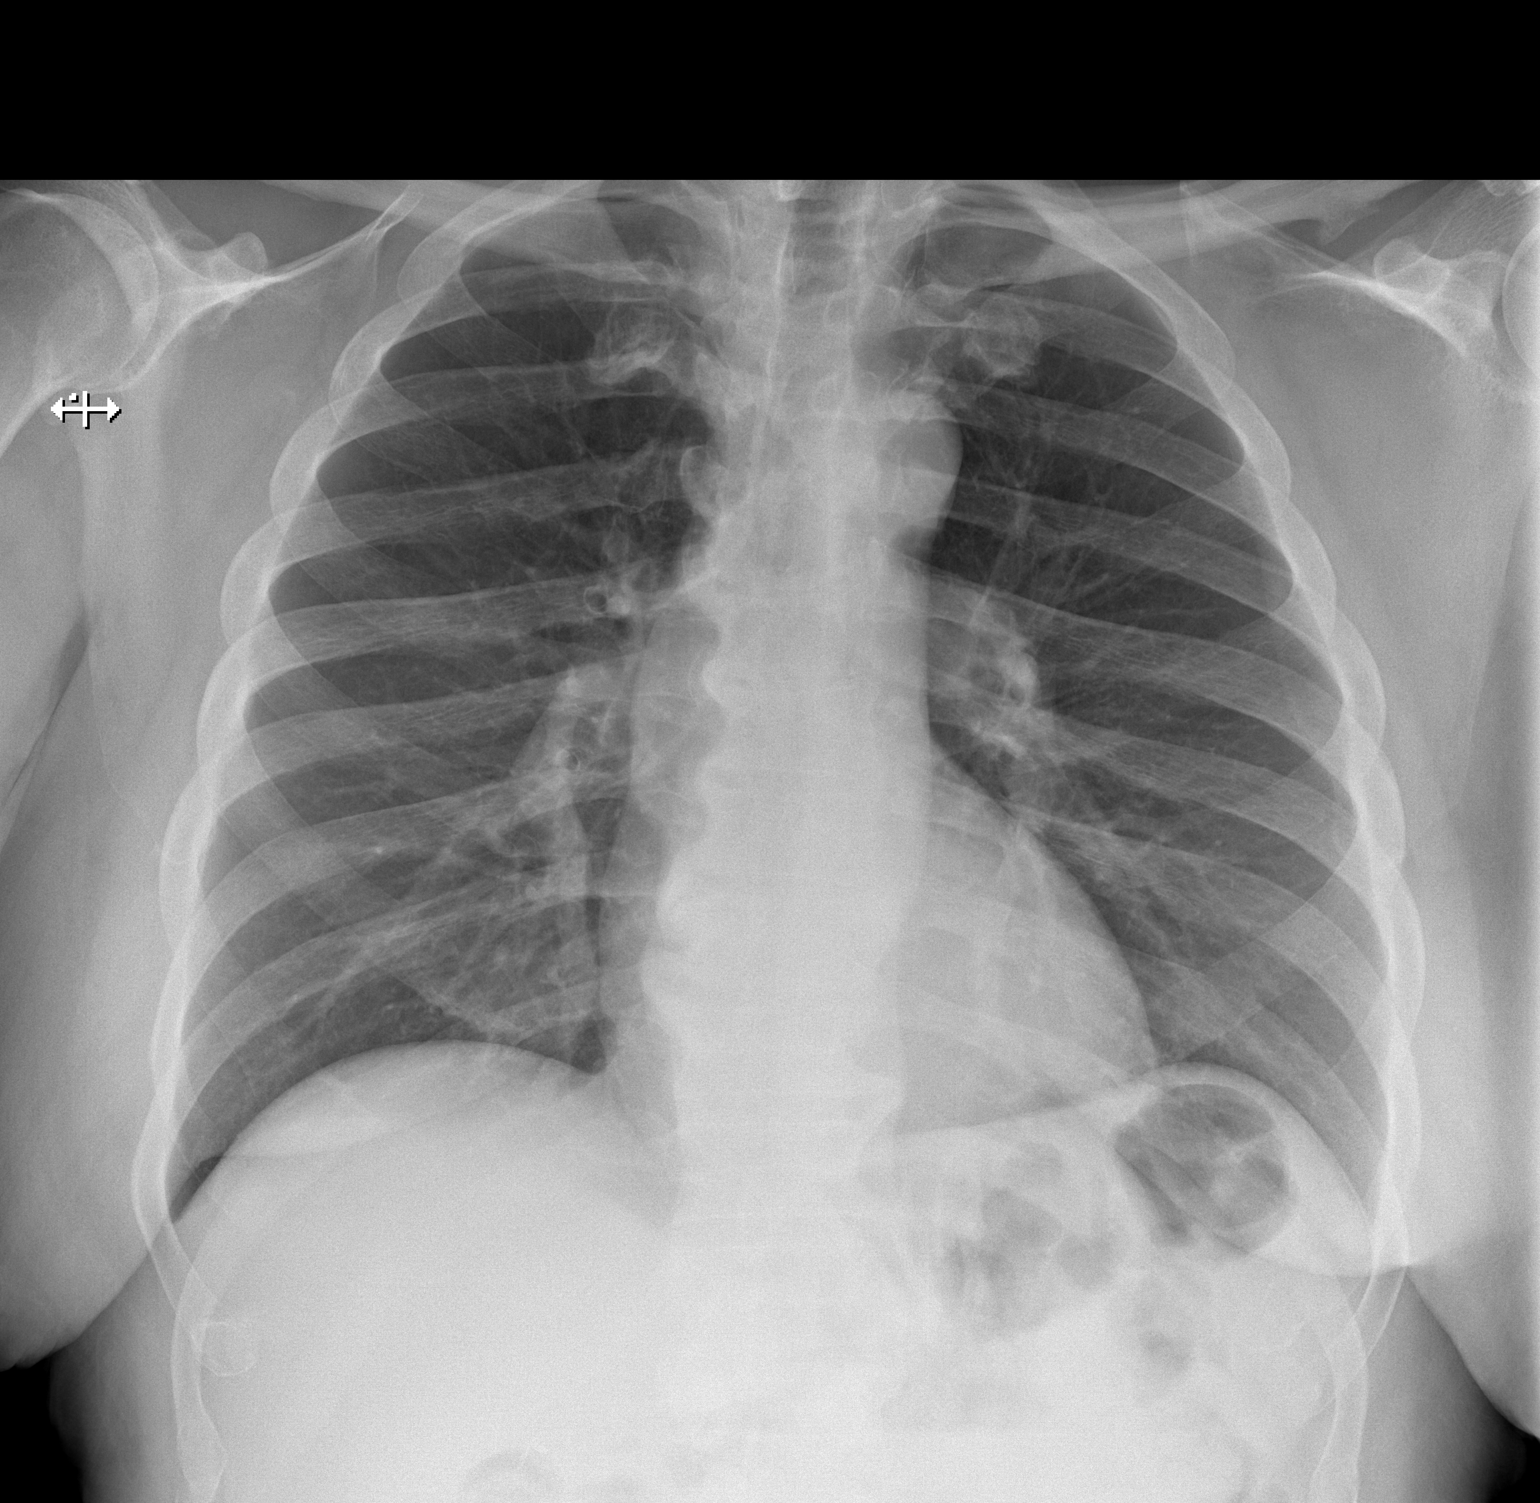

[w chest lat]
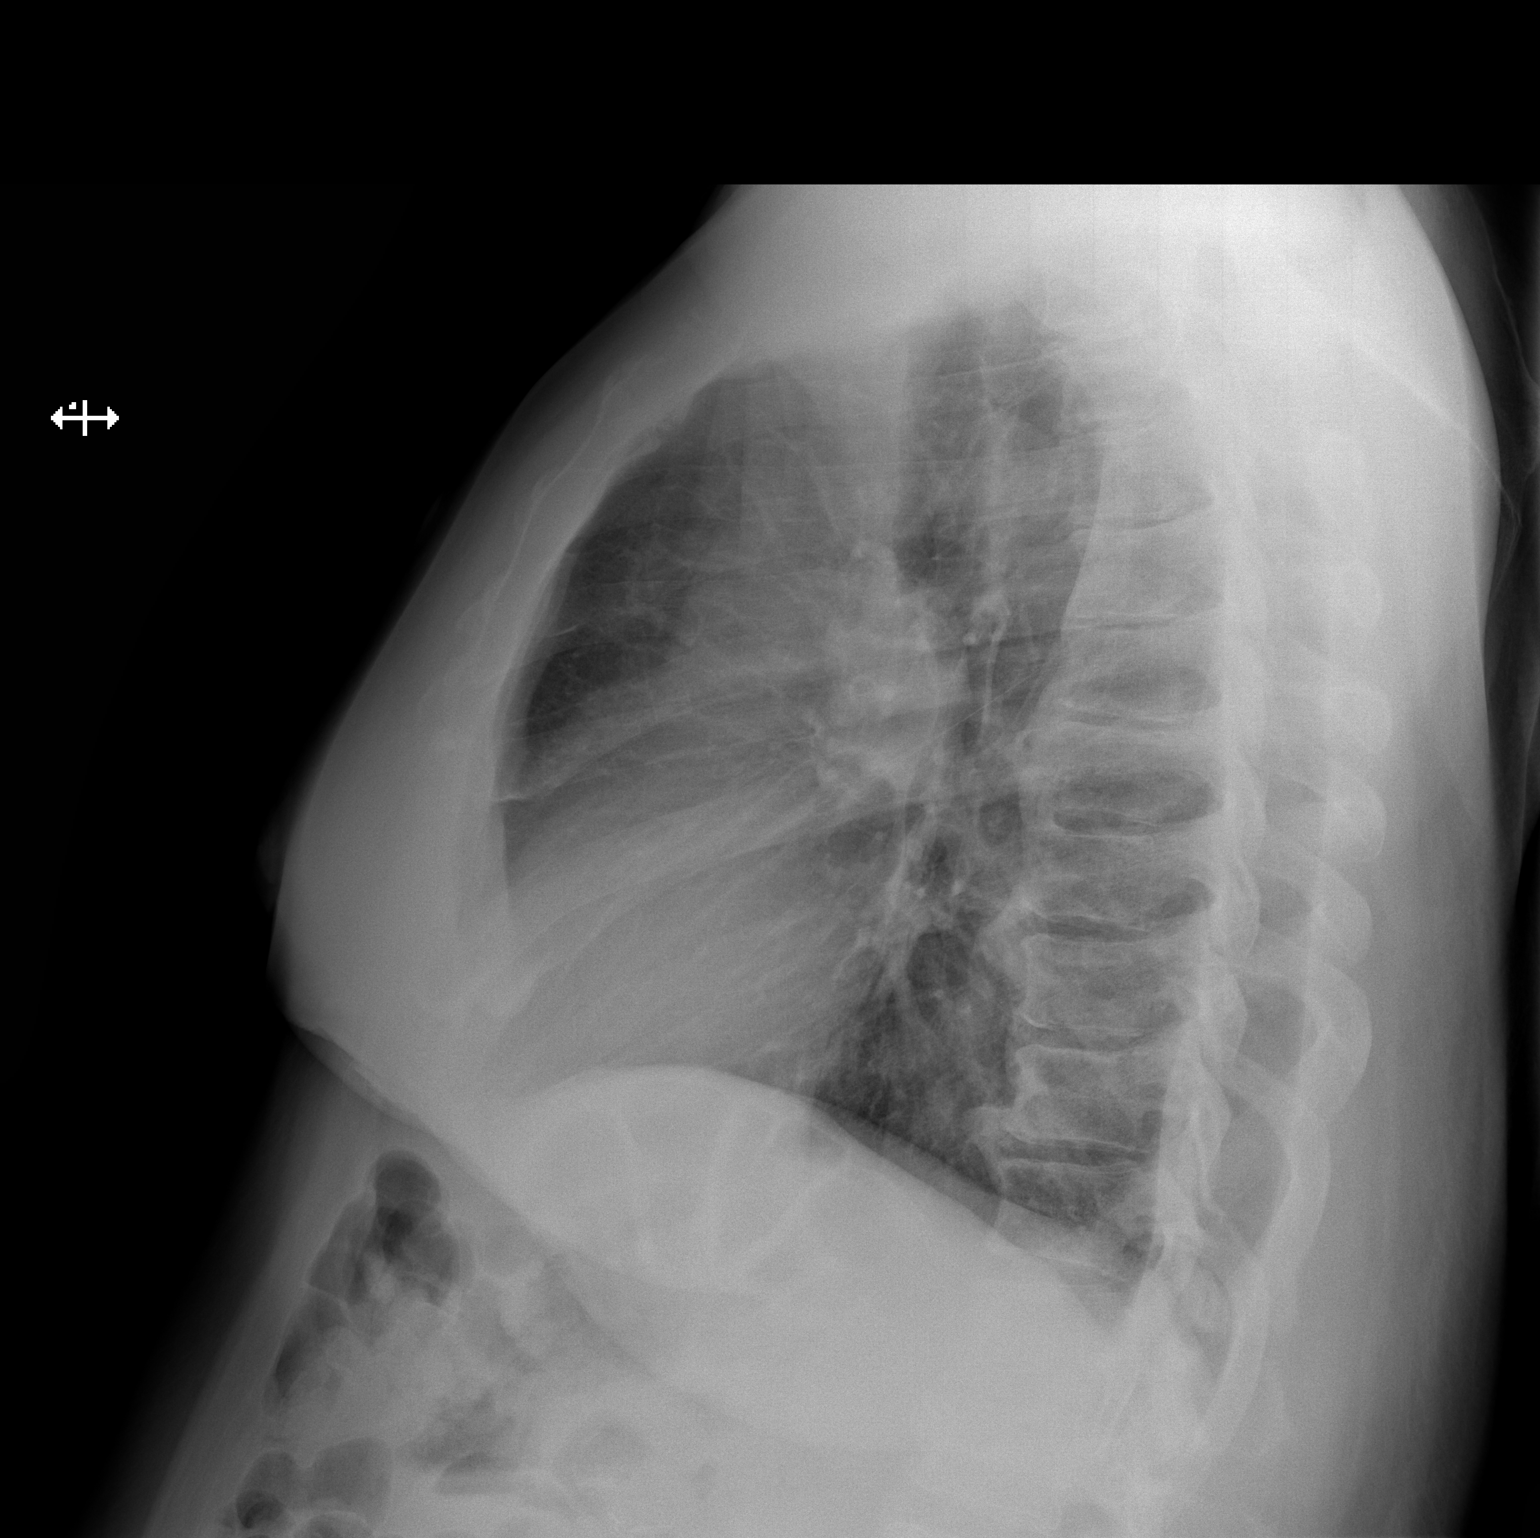

[2 of 2 positions shown; findings below may reference images not displayed]

FINDINGS: The heart size and mediastinal contours are within normal limits.
Normal pulmonary vascularity. No focal consolidation, pleural
effusion, or pneumothorax. No acute osseous abnormality.
IMPRESSION: No active cardiopulmonary disease.

## 2019-12-21 IMAGING — CT CT RENAL STONE PROTOCOL
2 of 4 series · 16 of 46 positions shown, 18 images · non-contrast
Comparison: Chest CTA 09/27/2014.

CLINICAL DATA: 61-year-old male with left side back and flank pain
radiating to the right side. Fatigue for 2 days.

EXAM:
CT ABDOMEN AND PELVIS WITHOUT CONTRAST
TECHNIQUE: Multidetector CT imaging of the abdomen and pelvis was performed
following the standard protocol without IV contrast.

[Series 2: axial st · axial · 0.81mm/px · z∈[-691,-271]mm · 13 of 94 slices shown, 15 images]
[im 5/94  soft-tissue]
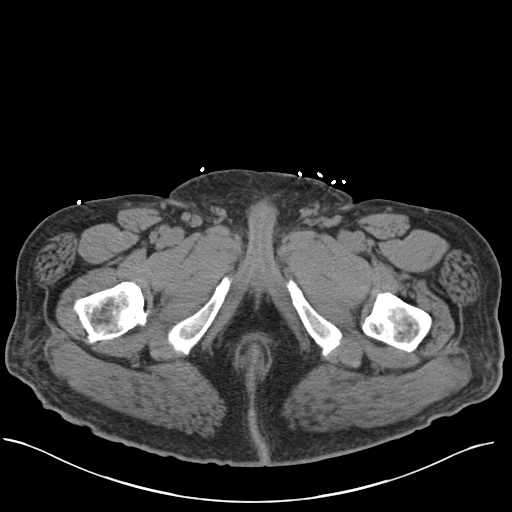
[im 5/94  bone]
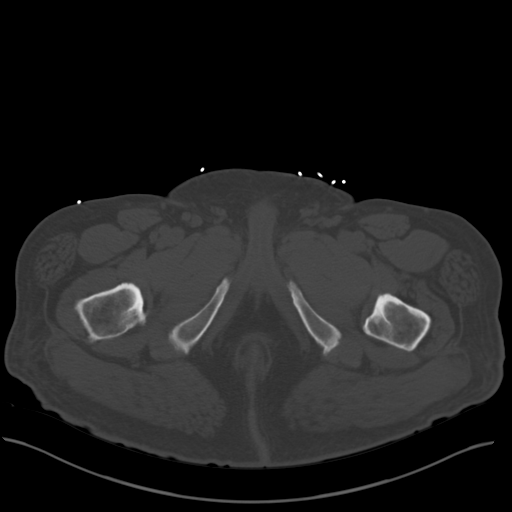
[im 15/94  soft-tissue]
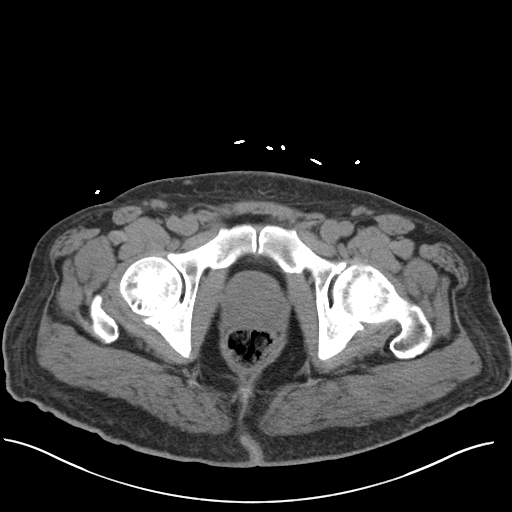
[im 20/94  soft-tissue]
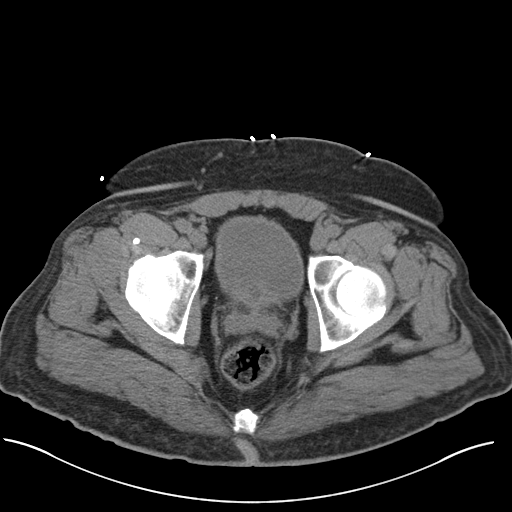
[im 25/94  soft-tissue]
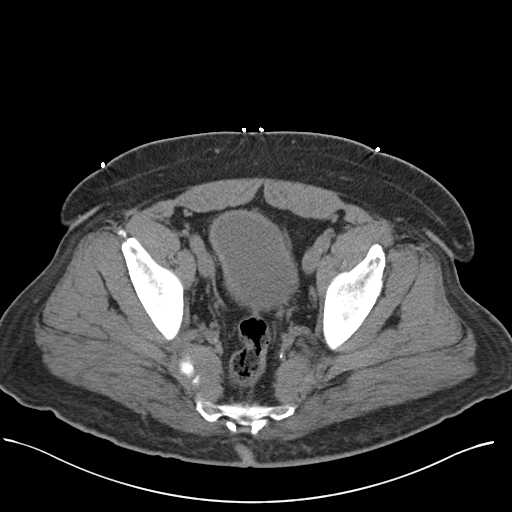
[im 35/94  soft-tissue]
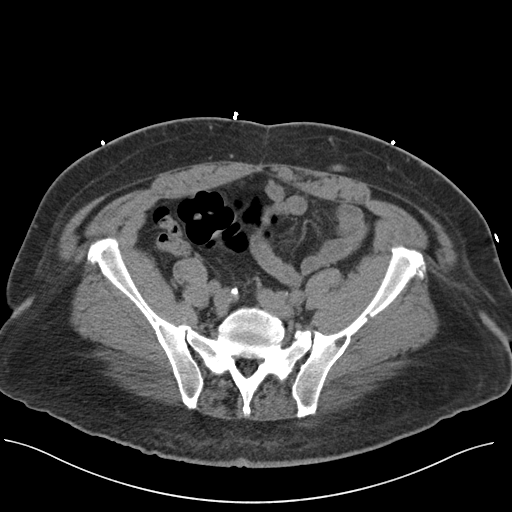
[im 40/94  soft-tissue]
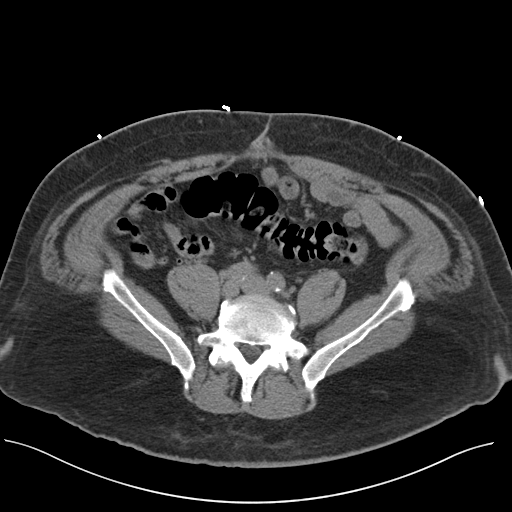
[im 49/94  soft-tissue]
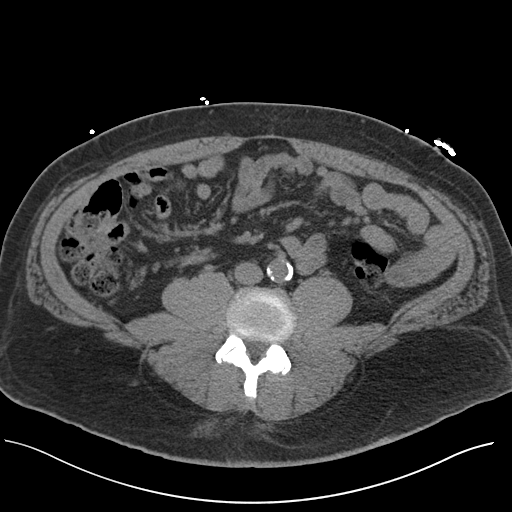
[im 54/94  soft-tissue]
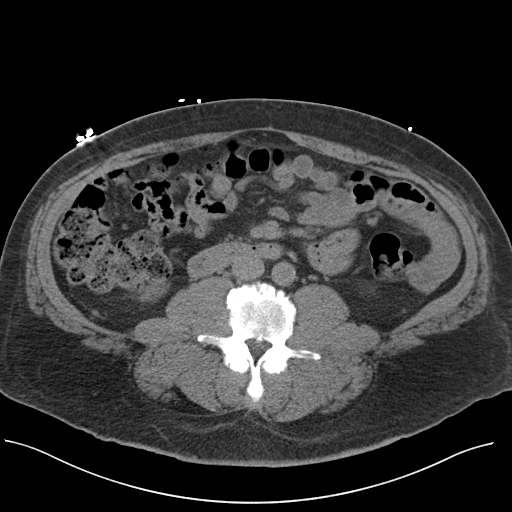
[im 59/94  soft-tissue]
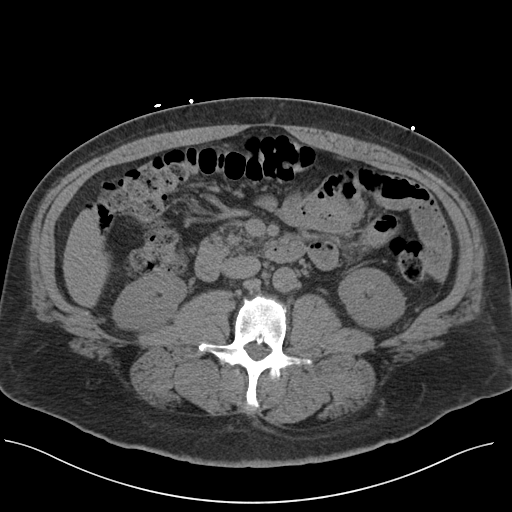
[im 59/94  bone]
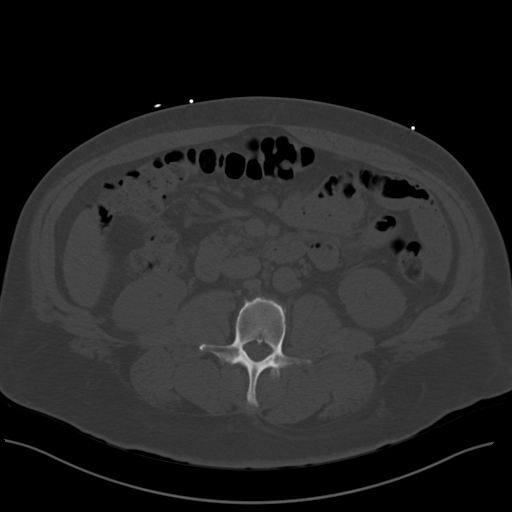
[im 69/94  soft-tissue]
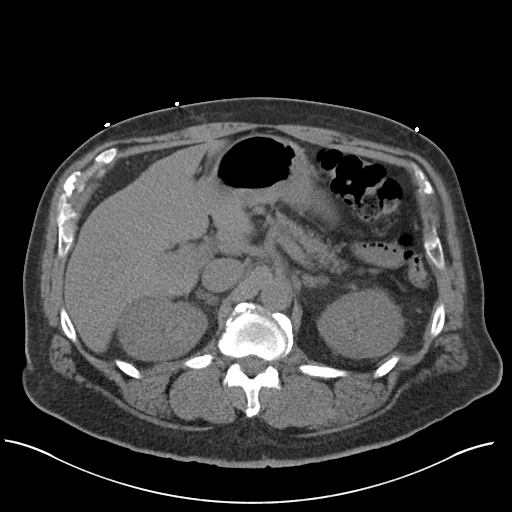
[im 74/94  soft-tissue]
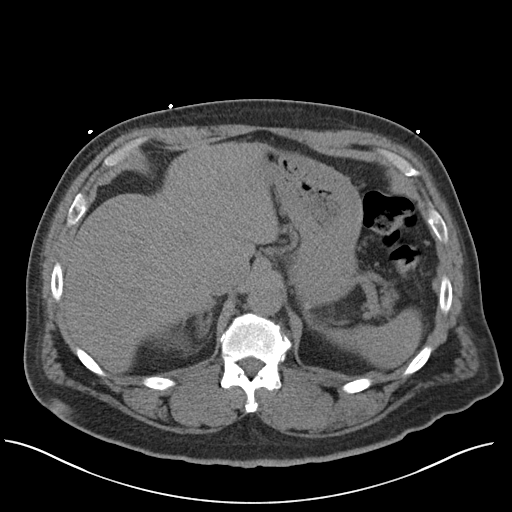
[im 79/94  soft-tissue]
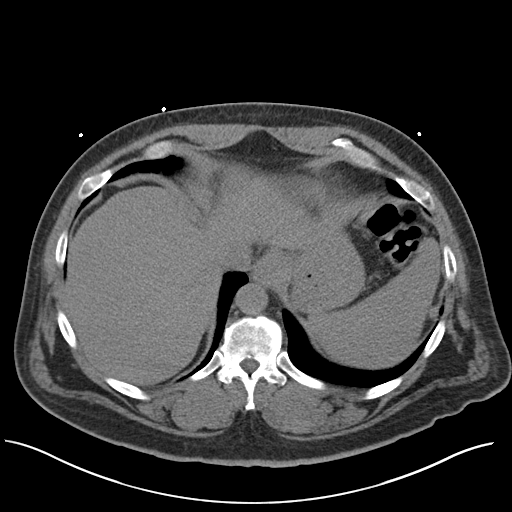
[im 89/94  soft-tissue]
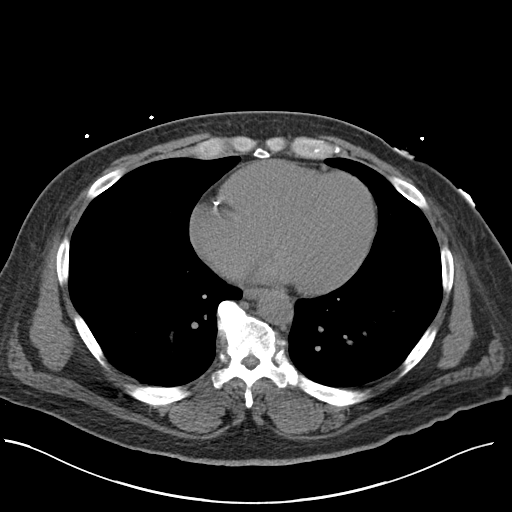

[Series 5: coronal · coronal · 0.88mm/px · 3 of 156 slices shown]
[im 52/156  soft-tissue]
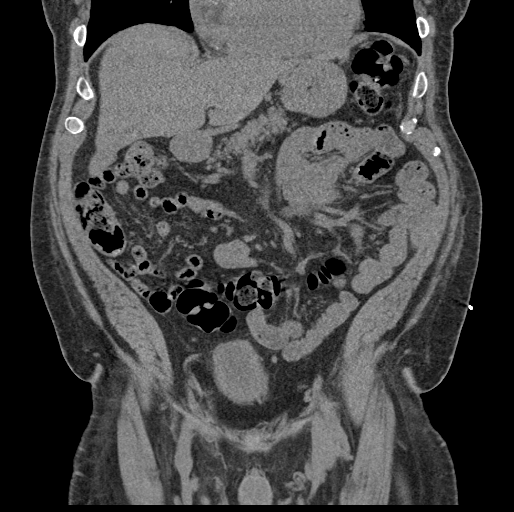
[im 69/156  soft-tissue]
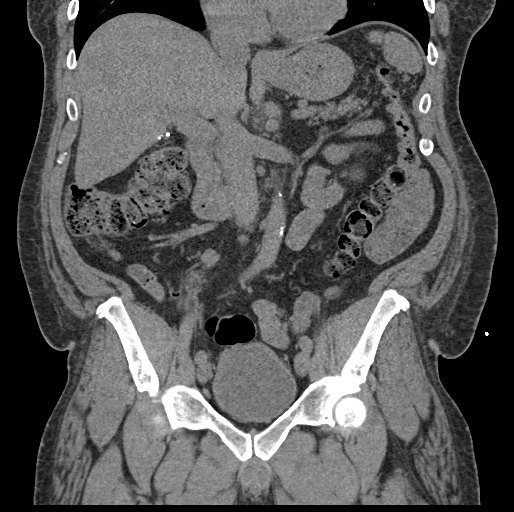
[im 87/156  soft-tissue]
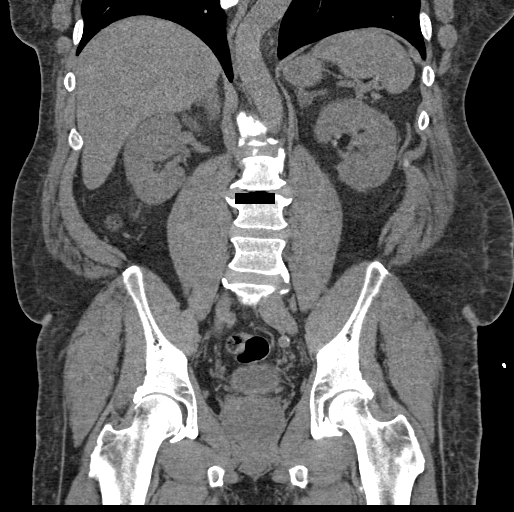

[16 of 46 positions shown; findings below may reference images not displayed]

FINDINGS: Lower chest: Mild atelectasis or scarring at the lung bases is
stable compared to the 6765 CTA. No pericardial or pleural effusion.

Hepatobiliary: Surgically absent gallbladder. Negative noncontrast
liver.

Pancreas: Negative.

Spleen: Negative.

Adrenals/Urinary Tract: Normal adrenal glands aside from mild
thickening such as due to adrenal hyperplasia.

Negative noncontrast right kidney and proximal right ureter.
Negative course of the right ureter.

Diminutive and unremarkable urinary bladder.

Likewise the noncontrast left kidney appears normal. No
nephrolithiasis or hydronephrosis. Normal proximal left ureter, and
negative course of the left ureter. No urologic calculus identified.

Stomach/Bowel: Negative rectosigmoid colon. Negative left colon and
transverse colon. Mild retained stool from the hepatic flexure
through the right colon. No large bowel inflammation. Normal
appendix.

Negative terminal ileum. No dilated small bowel. Negative stomach
and duodenum.

No abdominal free air or free fluid.

Vascular/Lymphatic: Aortoiliac calcified atherosclerosis. Vascular
patency is not evaluated in the absence of IV contrast.

No lymphadenopathy.

Reproductive: Mild prostate enlargement up to 55 millimeters
diameter. Otherwise negative.

Other: No pelvic free fluid.

Musculoskeletal: Degenerative appearing sclerosis and superior
endplate deformity of the L1 vertebral body, probably a large
superior endplate Schmorl's node. Widespread endplate osteophytosis.
Moderate to severe lower lumbar facet degeneration. No acute osseous
abnormality identified.
IMPRESSION: 1. No urologic calculus, obstructive uropathy, or acute inflammatory
process identified in the noncontrast abdomen or pelvis.
2. Multilevel lumbar spine degeneration. No acute osseous
abnormality identified.
3.  Aortic Atherosclerosis ([id number]).

## 2020-01-15 IMAGING — DX DG FOREARM 2V*L*
2 series · 2 of 2 positions shown · non-contrast
Comparison: None.

CLINICAL DATA: Initial evaluation for acute pain status post
trauma.

EXAM:
LEFT FOREARM - 2 VIEW

[forearm ap]
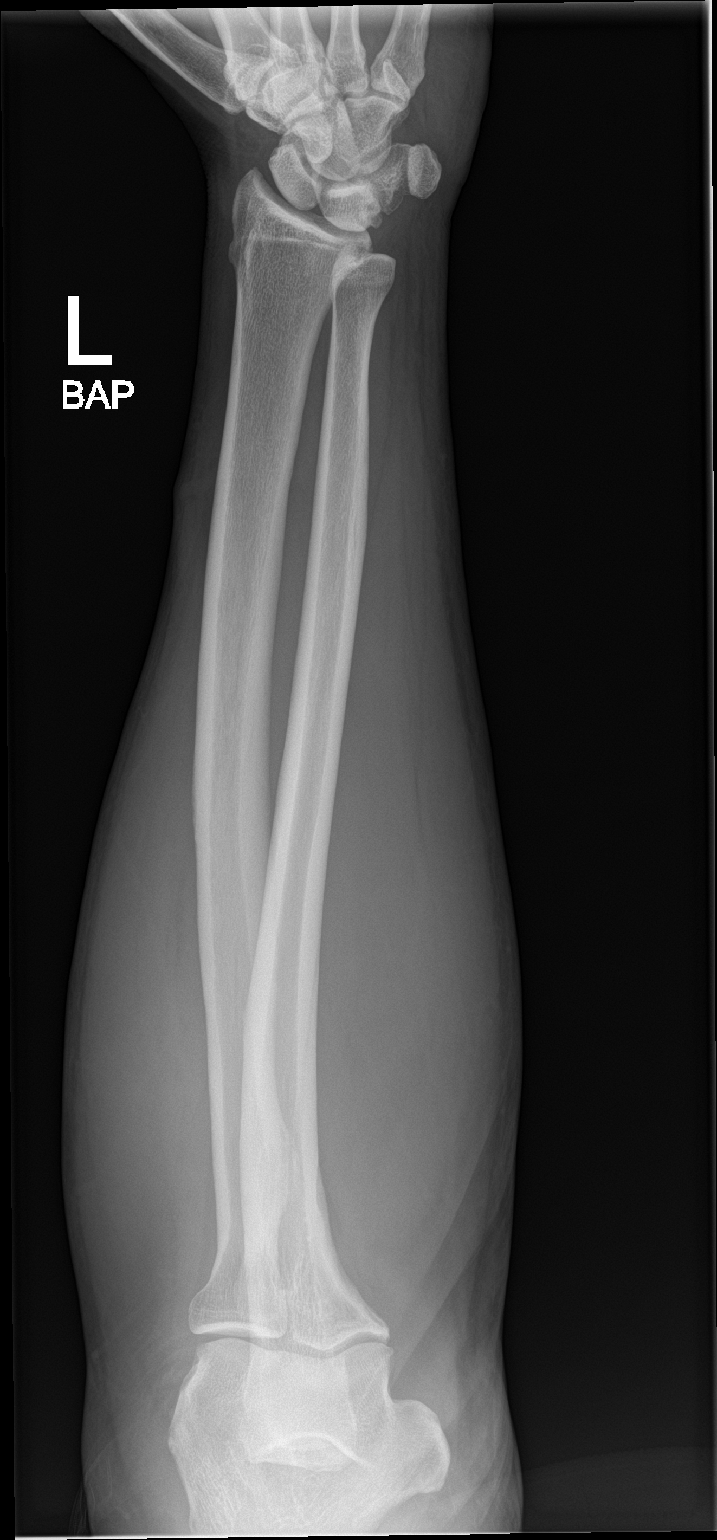

[forearm lat]
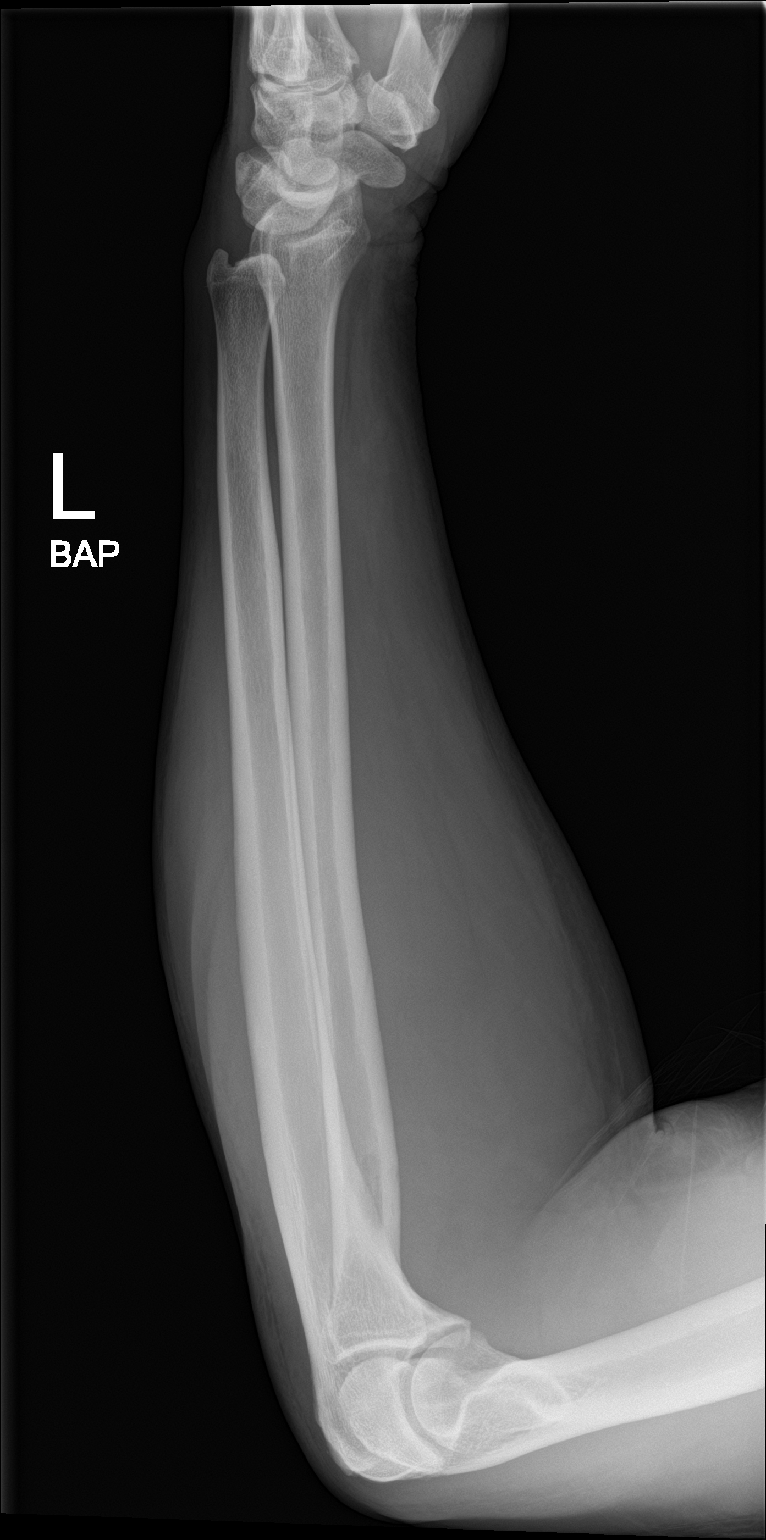

[2 of 2 positions shown; findings below may reference images not displayed]

FINDINGS: There is no evidence of fracture or other focal bone lesions. Soft
tissues are unremarkable.
IMPRESSION: Negative.

## 2020-01-21 IMAGING — CR DG LUMBAR SPINE COMPLETE 4+V
5 series · 5 of 5 positions shown · non-contrast
Comparison: Reformats from abdominal CT 05/20/2017

CLINICAL DATA: Lumbosacral back pain.  No known injury.

EXAM:
LUMBAR SPINE - COMPLETE 4+ VIEW

[t lumbar spine ap]
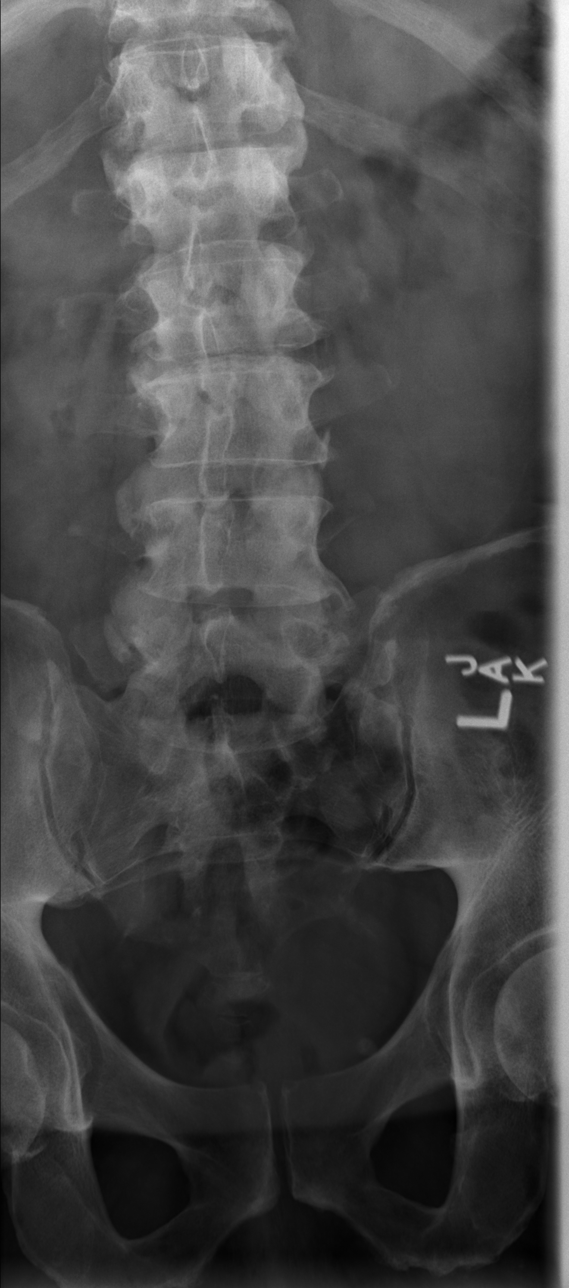

[t lumbar spine obl (1 of 2)]
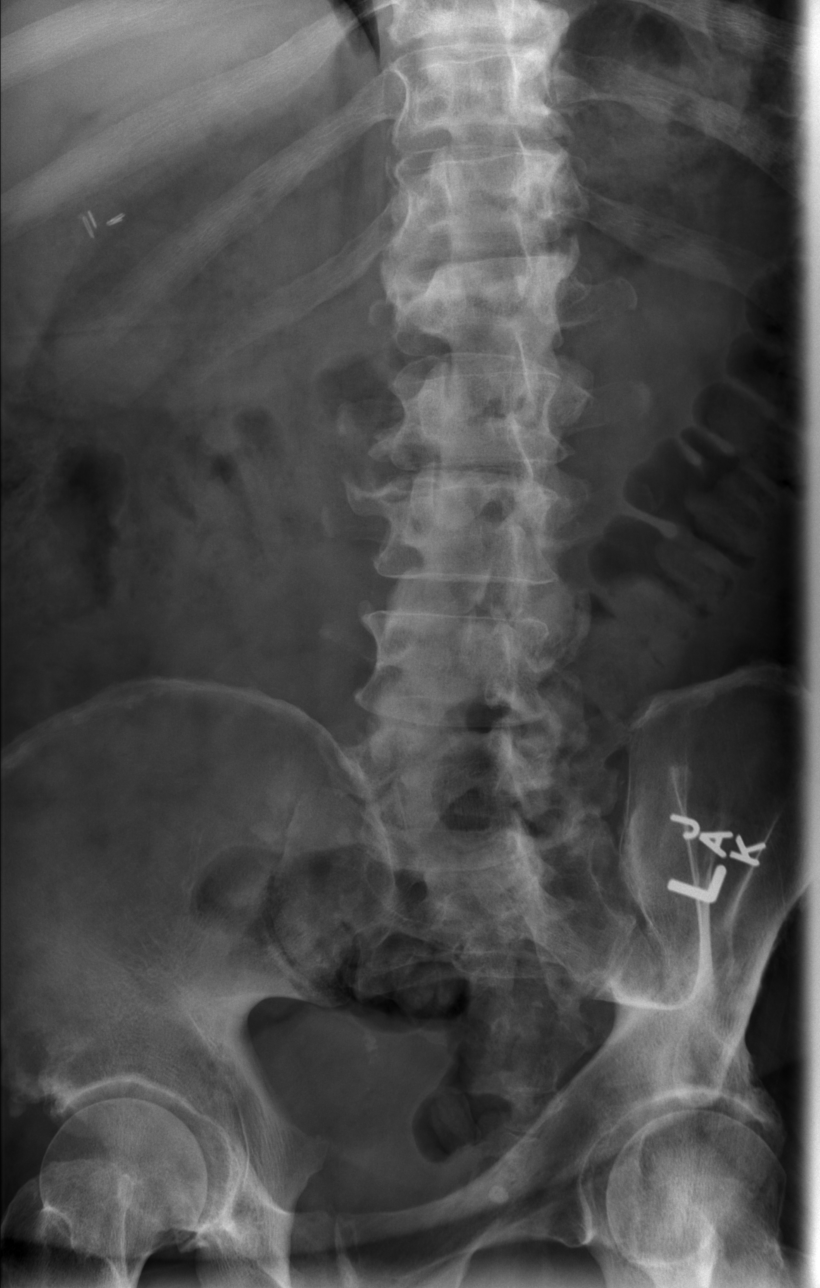

[t lumbar spine obl (2 of 2)]
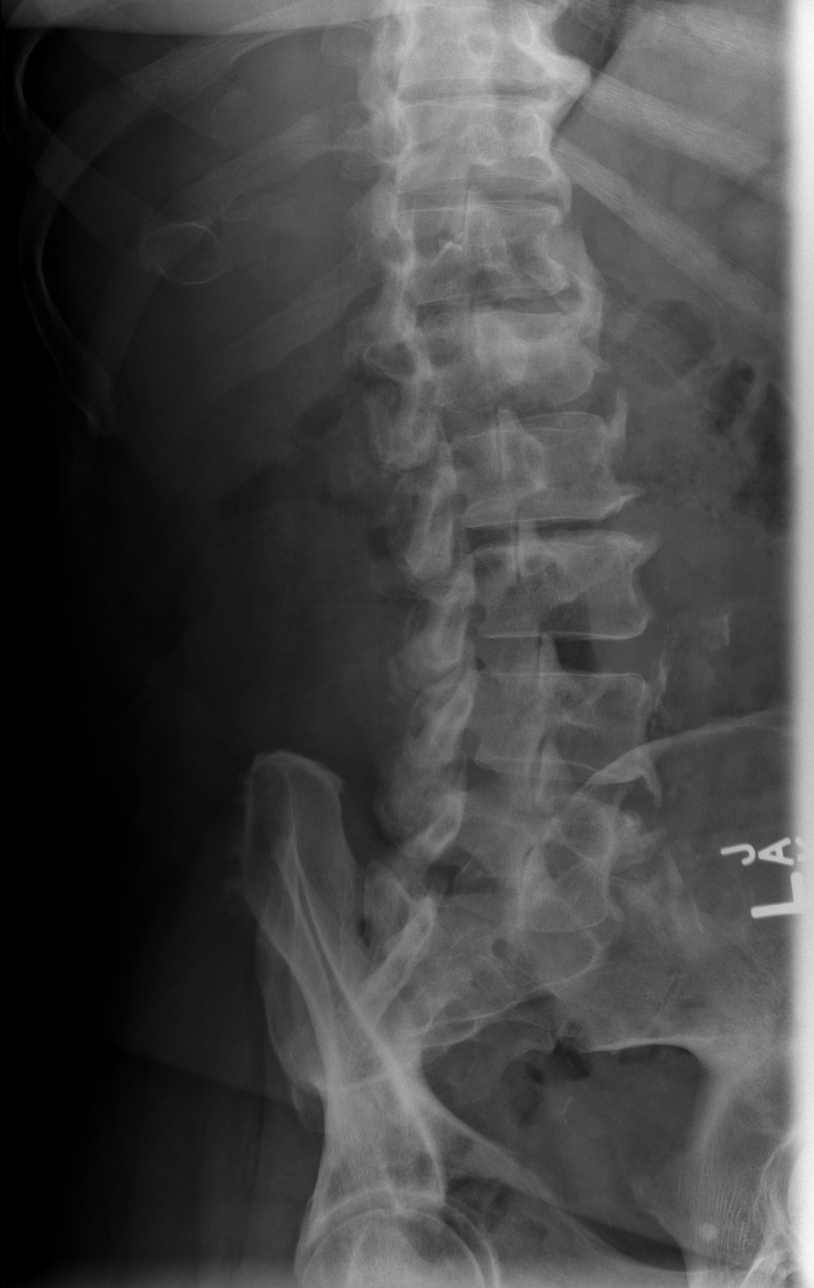

[t lumbar spine lat]
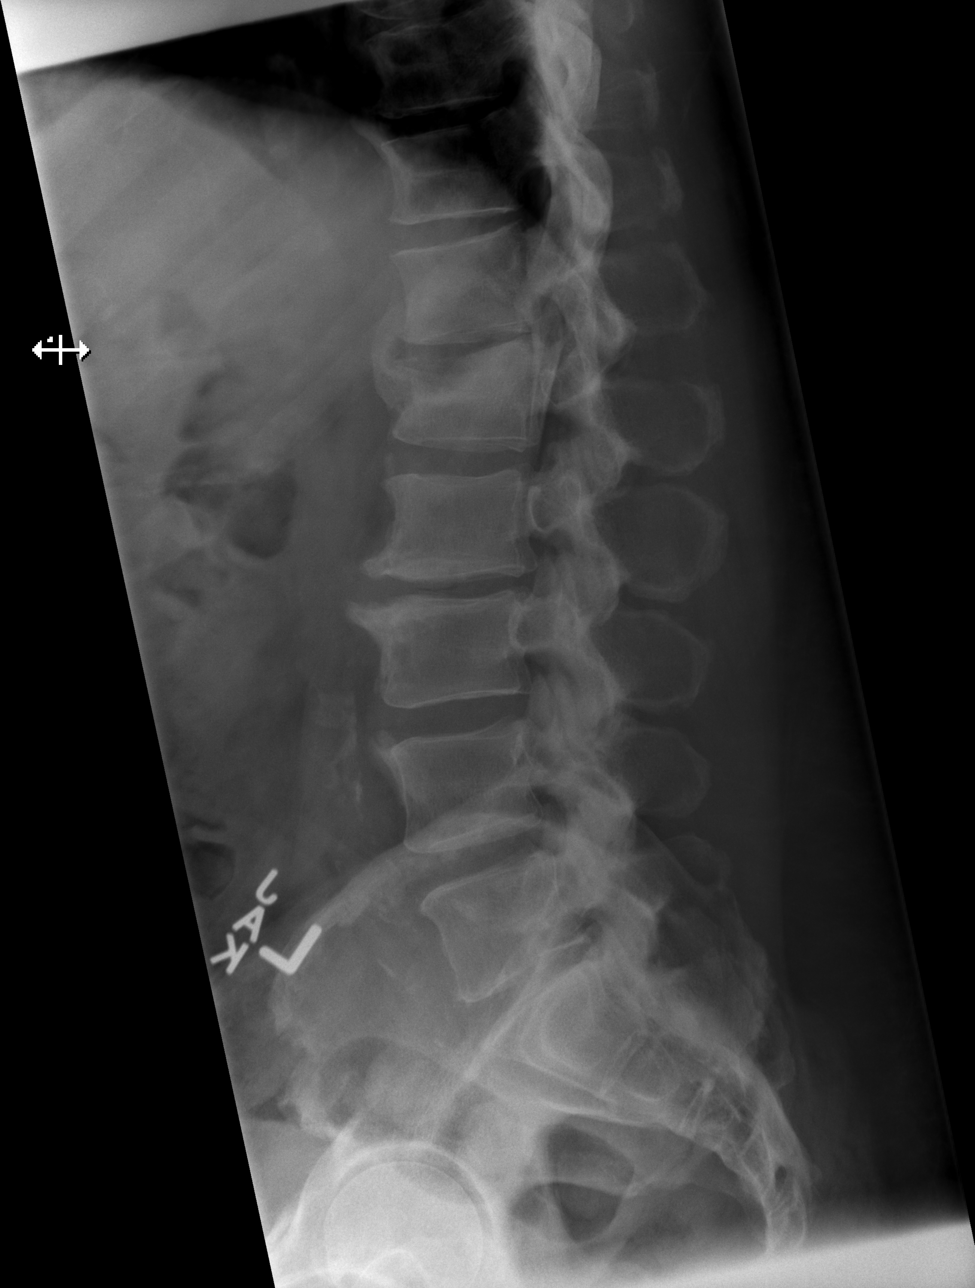

[t lumbar l-5 s-1 spot]
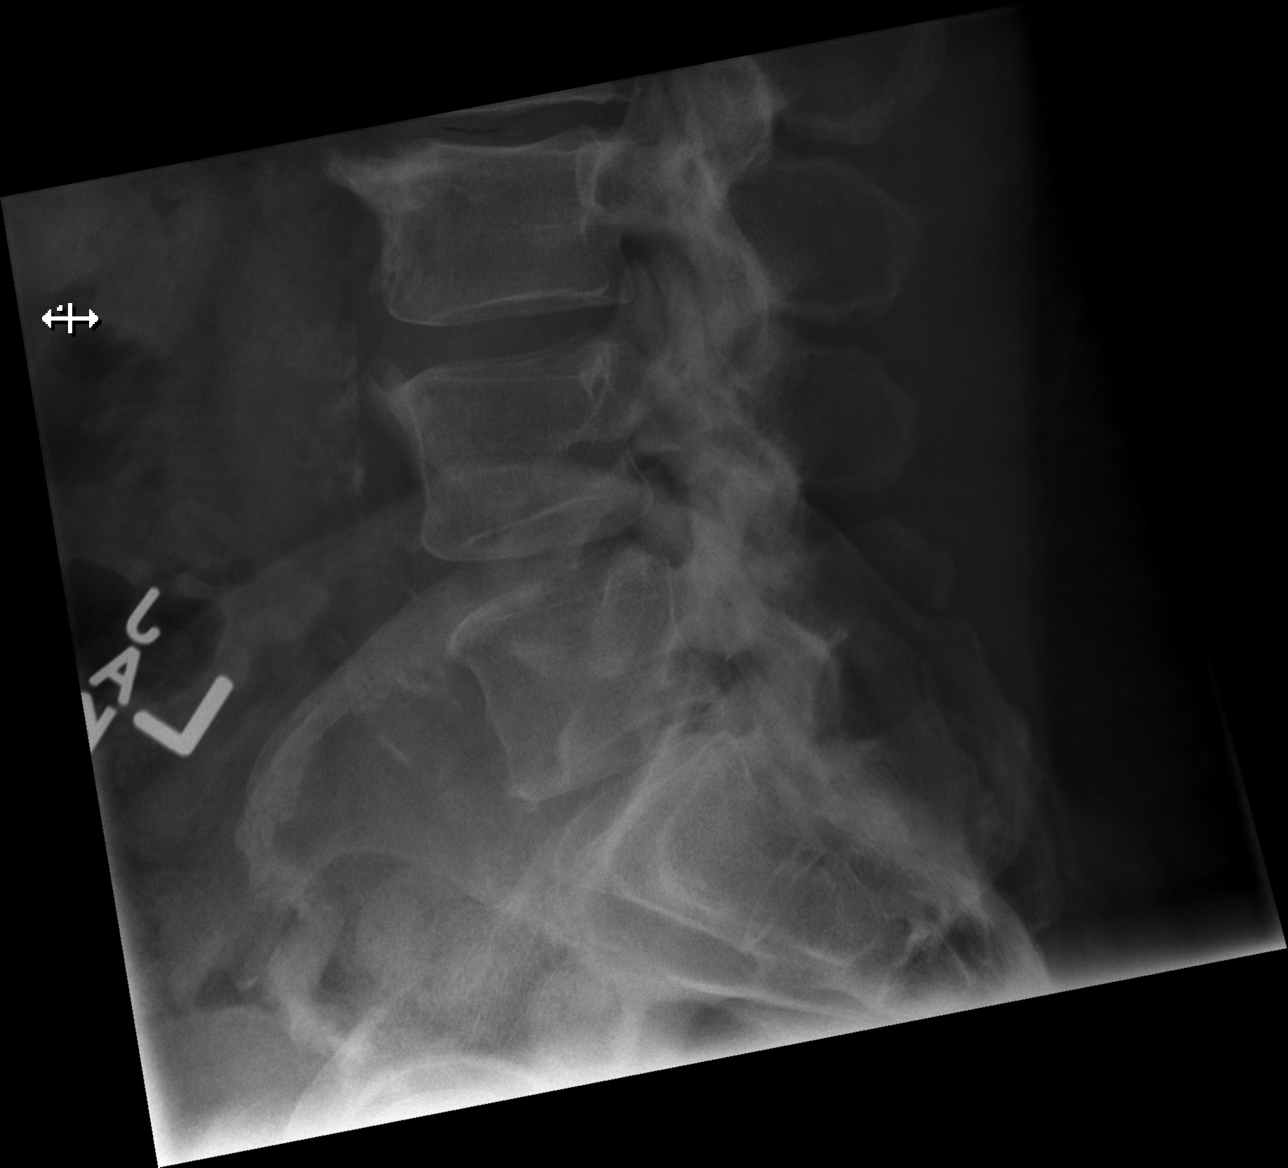

[5 of 5 positions shown; findings below may reference images not displayed]

FINDINGS: No acute fracture. Loss of height of L1 superior endplate with
sclerosis is unchanged from prior exam, may be remote compression
deformity or large Schmorl's node. Remaining vertebral body heights
are preserved. Straightening of normal lordosis without listhesis.
Disc space narrowing at L2-L3. Multilevel endplate spurring. Facet
arthropathy most prominent in the lower lumbar spine. Sacroiliac
joints are congruent.
IMPRESSION: Degenerative change in the lumbar spine with degenerative disc
disease and facet arthropathy, similar to CT last month. No acute
osseous abnormalities.

## 2020-07-13 ENCOUNTER — Encounter (HOSPITAL_BASED_OUTPATIENT_CLINIC_OR_DEPARTMENT_OTHER): Payer: Self-pay | Admitting: *Deleted

## 2020-07-13 ENCOUNTER — Other Ambulatory Visit: Payer: Self-pay

## 2020-07-13 ENCOUNTER — Emergency Department (HOSPITAL_BASED_OUTPATIENT_CLINIC_OR_DEPARTMENT_OTHER)
Admission: EM | Admit: 2020-07-13 | Discharge: 2020-07-13 | Disposition: A | Discharge status: Home / Self Care | Payer: Medicare Other | Attending: Emergency Medicine | Admitting: Emergency Medicine

## 2020-07-13 DIAGNOSIS — J45909 Unspecified asthma, uncomplicated: Secondary | ICD-10-CM | POA: Insufficient documentation

## 2020-07-13 DIAGNOSIS — L723 Sebaceous cyst: Secondary | ICD-10-CM | POA: Insufficient documentation

## 2020-07-13 DIAGNOSIS — Z7984 Long term (current) use of oral hypoglycemic drugs: Secondary | ICD-10-CM | POA: Diagnosis not present

## 2020-07-13 DIAGNOSIS — F17210 Nicotine dependence, cigarettes, uncomplicated: Secondary | ICD-10-CM | POA: Diagnosis not present

## 2020-07-13 DIAGNOSIS — R519 Headache, unspecified: Secondary | ICD-10-CM | POA: Insufficient documentation

## 2020-07-13 DIAGNOSIS — R7989 Other specified abnormal findings of blood chemistry: Secondary | ICD-10-CM | POA: Diagnosis not present

## 2020-07-13 DIAGNOSIS — E1165 Type 2 diabetes mellitus with hyperglycemia: Secondary | ICD-10-CM | POA: Insufficient documentation

## 2020-07-13 DIAGNOSIS — R739 Hyperglycemia, unspecified: Secondary | ICD-10-CM

## 2020-07-13 LAB — CBC WITH DIFFERENTIAL/PLATELET
Abs Immature Granulocytes: 0.04 10*3/uL (ref 0.00–0.07)
Basophils Absolute: 0 10*3/uL (ref 0.0–0.1)
Basophils Relative: 0 %
Eosinophils Absolute: 0.1 10*3/uL (ref 0.0–0.5)
Eosinophils Relative: 1 %
HCT: 39.9 % (ref 39.0–52.0)
Hemoglobin: 13.7 g/dL (ref 13.0–17.0)
Immature Granulocytes: 1 %
Lymphocytes Relative: 35 %
Lymphs Abs: 2.4 10*3/uL (ref 0.7–4.0)
MCH: 26.2 pg (ref 26.0–34.0)
MCHC: 34.3 g/dL (ref 30.0–36.0)
MCV: 76.4 fL — ABNORMAL LOW (ref 80.0–100.0)
Monocytes Absolute: 0.4 10*3/uL (ref 0.1–1.0)
Monocytes Relative: 6 %
Neutro Abs: 3.9 10*3/uL (ref 1.7–7.7)
Neutrophils Relative %: 57 %
Platelets: 148 10*3/uL — ABNORMAL LOW (ref 150–400)
RBC: 5.22 MIL/uL (ref 4.22–5.81)
RDW: 13.6 % (ref 11.5–15.5)
WBC: 6.7 10*3/uL (ref 4.0–10.5)
nRBC: 0 % (ref 0.0–0.2)

## 2020-07-13 LAB — BASIC METABOLIC PANEL
Anion gap: 8 (ref 5–15)
BUN: 13 mg/dL (ref 8–23)
CO2: 27 mmol/L (ref 22–32)
Calcium: 9 mg/dL (ref 8.9–10.3)
Chloride: 101 mmol/L (ref 98–111)
Creatinine, Ser: 1.54 mg/dL — ABNORMAL HIGH (ref 0.61–1.24)
GFR, Estimated: 50 mL/min — ABNORMAL LOW (ref >60)
Glucose, Bld: 466 mg/dL — ABNORMAL HIGH (ref 70–99)
Potassium: 4.6 mmol/L (ref 3.5–5.1)
Sodium: 136 mmol/L (ref 135–145)

## 2020-07-13 MED ORDER — SODIUM CHLORIDE 0.9 % IV BOLUS: 1000.0000 mL | Freq: Once | INTRAVENOUS | Status: DC

## 2020-07-13 MED ORDER — LIDOCAINE-EPINEPHRINE (PF) 2 %-1:200000 IJ SOLN
10.0000 mL | Freq: Once | INTRAMUSCULAR | Status: AC
  Administered 2020-07-13: 10 mL
  Filled 2020-07-13: qty 20

## 2020-07-13 MED ORDER — SULFAMETHOXAZOLE-TRIMETHOPRIM 800-160 MG PO TABS: 1.0000 | ORAL_TABLET | Freq: Two times a day (BID) | ORAL | 0 refills | Status: AC

## 2020-07-13 NOTE — ED Provider Notes (Signed)
[facility] EMERGENCY DEPARTMENT Provider Note   CSN: [id number] Arrival date & time: 07/13/20  2011     History Chief Complaint  Patient presents with   Abscess    Lee Reid is a 65 y.o. male.  Patient with history of diabetes presents the emergency department for evaluation of what appears to be inflamed sebaceous cyst on his right shoulder area with other symptoms.  He states that over the past 10 days he has noted pain and swelling in the area over his right shoulder.  No drainage.  He has had subjective fever at times and nausea but no vomiting.  He does not feel like his blood sugars have been uncontrolled, but notes that he has not checked his blood sugar.  He states that when the shoulder is worse, it causes him to have a headache, to feel strange sensations on the right side of his body, and to have pus spontaneously drain from a location on his gums.  Patient denies signs of stroke including: facial droop, slurred speech, aphasia, weakness/numbness in extremities, imbalance/trouble walking.  He has taken some Aleve which helped temporarily.  He is concerned about having a blood clot or a bad infection.  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       Past Medical History:  Diagnosis Date   Anginal pain ([place])    Anxiety    Asthma    Chronic back pain    Depression    Diabetes mellitus without complication ([place])    Dysrhythmia    irregular heartbeat due to stress   GERD (gastroesophageal reflux disease)    Heart murmur    born with heart murmur   Pneumonia     Patient Active Problem List   Diagnosis Date Noted   Peripheral arterial disease ([place]) 07/15/2017   ARF (acute renal failure) ([place]) 09/28/2014   Abdominal wall cellulitis 09/27/2014   Cellulitis of abdominal wall 09/27/2014   S/P cholecystectomy 09/27/2014   Chronic cholecystitis 09/16/2014   Preoperative clearance 08/29/2014   Type 2 diabetes mellitus without complication ([place]) 47/42/5956   Type  2 diabetes mellitus without complications ([place]) 38/75/6433   Morbid obesity ([place]) 07/29/2014   Colon cancer screening 07/29/2014    Past Surgical History:  Procedure Laterality Date   CHOLECYSTECTOMY N/A 09/16/2014   Procedure: LAPAROSCOPIC CHOLECYSTECTOMY ;  Surgeon: Arta Bruce Kinsinger, MD;  Location: [facility];  Service: General;  Laterality: N/A;   EYE SURGERY     TESTICLE SURGERY     growth removed   TONSILLECTOMY         Family History  Problem Relation Age of Onset   Hypertension Mother    Heart disease Father    Cancer Father     Social History   Tobacco Use   Smoking status: Every Day    Packs/day: 0.25    Pack years: 0.00    Types: Cigarettes   Smokeless tobacco: Never  Vaping Use   Vaping Use: Former  Substance Use Topics   Alcohol use: Not Currently    Comment: occ   Drug use: No    Home Medications Prior to Admission medications   Medication Sig Start Date End Date Taking? Authorizing Provider  celecoxib (CELEBREX) 200 MG capsule Take 1 capsule (200 mg total) by mouth 2 (two) times daily. 03/28/18   Isla Pence, MD  clotrimazole (LOTRIMIN) 1 % cream Apply to affected area 2 times daily 04/09/18   Davonna Belling, MD  doxycycline (VIBRAMYCIN)  100 MG capsule Take 1 capsule (100 mg total) by mouth 2 (two) times daily. 04/09/18   Davonna Belling, MD  HYDROcodone-acetaminophen (NORCO/VICODIN) 5-325 MG tablet Take 1 tablet by mouth every 4 (four) hours as needed. 03/28/18   Isla Pence, MD  magic mouthwash SOLN Take 5 mLs by mouth 3 (three) times daily as needed for mouth pain. 04/09/18   Long, Wonda Olds, MD  metFORMIN (GLUCOPHAGE) 1000 MG tablet Take 1 tablet (1,000 mg total) by mouth 2 (two) times daily. 10/04/19   Truddie Hidden, MD    Allergies    Bee venom  Review of Systems   Review of Systems  Constitutional:  Positive for fever (Subjective). Negative for chills.  HENT:  Positive for mouth sores.   Gastrointestinal:  Positive for nausea.  Negative for vomiting.  Endocrine: Positive for polyuria (drinks 2 gallons of water per day). Negative for polydipsia.  Skin:  Positive for color change.       Positive for abscess  Hematological:  Negative for adenopathy.   Physical Exam Updated Vital Signs BP 133/80 (BP Location: Right Arm)   Pulse 84   Temp 98.8 F (37.1 C) (Oral)   Resp 18   Ht 6\' 1"  (1.854 m)   Wt 117.9 kg   SpO2 97%   BMI 34.30 kg/m   Physical Exam Vitals and nursing note reviewed.  Constitutional:      General: He is not in acute distress.    Appearance: He is well-developed.  HENT:     Head: Normocephalic and atraumatic.     Mouth/Throat:     Mouth: Mucous membranes are moist.     Comments: No dental abscess or active drainage noted.  Gums appear slightly inflamed. Eyes:     General:        Right eye: No discharge.        Left eye: No discharge.     Conjunctiva/sclera: Conjunctivae normal.     Comments: Patient with chronic nystagmus.  Right pupil appears normal.  Cornea cloudy on left.  Cardiovascular:     Rate and Rhythm: Normal rate and regular rhythm.     Heart sounds: Normal heart sounds.  Pulmonary:     Effort: Pulmonary effort is normal.     Breath sounds: Normal breath sounds.  Abdominal:     Palpations: Abdomen is soft.     Tenderness: There is no abdominal tenderness.  Musculoskeletal:     Cervical back: Normal range of motion and neck supple.  Skin:    General: Skin is warm and dry.     Comments: 2 cm nodule, fluctuant, no surrounding cellulitis, mild overlying erythema, over the right scapular area consistent with an inflamed sebaceous cyst.  Neurological:     General: No focal deficit present.     Mental Status: He is alert.     Cranial Nerves: No cranial nerve deficit.     Motor: No weakness.    ED Results / Procedures / Treatments   Labs (all labs ordered are listed, but only abnormal results are displayed) Labs Reviewed  CBC WITH DIFFERENTIAL/PLATELET - Abnormal;  Notable for the following components:      Result Value   MCV 76.4 (*)    Platelets 148 (*)    All other components within normal limits  BASIC METABOLIC PANEL - Abnormal; Notable for the following components:   Glucose, Bld 466 (*)    Creatinine, Ser 1.54 (*)    GFR, Estimated 50 (*)  All other components within normal limits    EKG None  Radiology No results found.  Procedures .Marland KitchenIncision and Drainage  Date/Time: 07/13/2020 11:29 PM Performed by: Carlisle Cater, PA-C Authorized by: Carlisle Cater, PA-C   Consent:    Consent obtained:  Verbal   Consent given by:  Patient   Risks discussed:  Bleeding, incomplete drainage, pain and infection   Alternatives discussed:  No treatment Universal protocol:    Patient identity confirmed:  Verbally with patient Location:    Type:  Cyst   Size:  2cm   Location:  Trunk   Trunk location:  Back Pre-procedure details:    Skin preparation:  Povidone-iodine Anesthesia:    Anesthesia method:  Local infiltration   Local anesthetic:  Lidocaine 2% WITH epi Procedure type:    Complexity:  Simple Procedure details:    Incision types:  Stab incision   Wound management:  Probed and deloculated   Drainage:  Purulent   Drainage amount:  Moderate   Wound treatment:  Wound left open   Packing materials:  None Post-procedure details:    Procedure completion:  Tolerated well, no immediate complications   Medications Ordered in ED Medications  lidocaine-EPINEPHrine (XYLOCAINE W/EPI) 2 %-1:200000 (PF) injection 10 mL (has no administration in time range)    ED Course  I have reviewed the triage vital signs and the nursing notes.  Pertinent labs & imaging results that were available during my care of the patient were reviewed by me and considered in my medical decision making (see chart for details).  Patient seen and examined.  Patient has an inflamed sebaceous cyst which will require I&D.  Discussed risks and benefits, patient would  like to proceed.  Given his diabetes and other vague symptoms, will check basic lab work including CBC and BMP.  We will consider placing on antibiotics for abscess.  Patient would prefer to have something to take to prevent a significant infection.  Based on initial exam, I have low concern for stroke.  He does not appear septic.  I do not see any significant dental infection.  No focal neurologic deficits.  Vital signs reviewed and are as follows: BP 133/80 (BP Location: Right Arm)   Pulse 84   Temp 98.8 F (37.1 C) (Oral)   Resp 18   Ht 6\' 1"  (1.854 m)   Wt 117.9 kg   SpO2 97%   BMI 34.30 kg/m   11:24 PM patient had normal white blood cell count, mildly elevated creatinine, and elevated blood sugar into the 400s.  No signs of DKA tonight.  Recommended IV fluid bolus to help with blood sugar and kidney function.  Patient initially excepted.  Later he decided that he would rather be discharged at this time and take his diabetes medications at home.  He is strongly encouraged to follow-up for recheck of his blood sugar and to have his kidney function rechecked.  Otherwise, sebaceous cyst drained with good results.  Bandaged by myself.    MDM Rules/Calculators/A&P                          Infected sebaceous cyst, likely contributing to other subjective problems.  Patient does not appear toxic.  White blood cell count normal.  We will give prescription for Bactrim given diabetes status, uncontrolled hyperglycemia.  Hyperglycemia without DKA: Likely partially due to noncompliance with medications and also infected sebaceous cyst.  Cyst drained, prescription for Bactrim.  Patient does  not appear septic.  Elevated creatinine: Will need rechecked.  Possibly elevated in part due to osmotic diuresis due to uncontrolled hyperglycemia.  IV fluids offered and declined.   Final Clinical Impression(s) / ED Diagnoses Final diagnoses:  Infected sebaceous cyst of skin  Hyperglycemia without ketosis   Elevated serum creatinine    Rx / DC Orders ED Discharge Orders          Ordered    sulfamethoxazole-trimethoprim (BACTRIM DS) 800-160 MG tablet  2 times daily        07/13/20 2322             Carlisle Cater, PA-C 07/13/20 [place], [place], DO 07/14/20 1458

## 2020-07-13 NOTE — ED Notes (Signed)
This RN went to start PIV for IVF; pt said "I don't want that, I haven't been taking my diabetes medication so I'll just go home and take that"; EDP aware, plans for discharge

## 2020-07-13 NOTE — Discharge Instructions (Addendum)
Please read and follow all provided instructions.  Your diagnoses today include:  1. Infected sebaceous cyst of skin   2. Hyperglycemia without ketosis   3. Elevated serum creatinine     Tests performed today include: Blood cell counts - normal white blood cell count Blood sugar -was very high in the 400s Kidney function test -creatinine was elevated slightly Vital signs. See below for your results today.   Medications prescribed:  Bactrim (trimethoprim/sulfamethoxazole) - antibiotic  You have been prescribed an antibiotic medicine: take the entire course of medicine even if you are feeling better. Stopping early can cause the antibiotic not to work.  Take any prescribed medications only as directed.  Home care instructions:  Follow any educational materials contained in this packet.  Please make sure you are taking your home diabetes medication.  Your blood sugar was uncontrolled today.  BE VERY CAREFUL not to take multiple medicines containing Tylenol (also called acetaminophen). Doing so can lead to an overdose which can damage your liver and cause liver failure and possibly death.   Follow-up instructions: Please follow-up with your primary care provider in the next 3 days for further evaluation of your symptoms.   Return instructions:  Please return to the Emergency Department if you experience worsening symptoms.  Return with vomiting, confusion, abdominal pain. Please return if you have any other emergent concerns.  Additional Information:  Your vital signs today were: BP 114/83   Pulse 70   Temp 98.8 F (37.1 C) (Oral)   Resp 20   Ht 6\' 1"  (1.854 m)   Wt 117.9 kg   SpO2 96%   BMI 34.30 kg/m  If your blood pressure (BP) was elevated above 135/85 this visit, please have this repeated by your doctor within one month. --------------

## 2020-07-13 NOTE — ED Triage Notes (Addendum)
Pt has ?cyst/abscess to right shoulder blade x 10 days. Pt reports headache and subjective fever on Friday

## 2020-07-13 NOTE — ED Notes (Signed)
Pt states he has has this abscess 9-10 days, states he has tried to squeeze it, but no drainage.  States he has felt bad, "all right side"

## 2020-11-01 IMAGING — MR MRI LUMBAR SPINE WITHOUT CONTRAST
6 series · 47 of 48 positions shown · non-contrast
Comparison: Lumbar radiographs 06/20/2017. CT Abdomen and Pelvis
05/20/2017.

CLINICAL DATA: 62-year-old male with left side low back pain and
bilateral leg fatigue.

EXAM:
MRI LUMBAR SPINE WITHOUT CONTRAST
TECHNIQUE: Multiplanar, multisequence MR imaging of the lumbar spine was
performed. No intravenous contrast was administered.

[Series 2: T2 · sagittal · 4.0mm · 1.02mm/px · 6 of 15 slices shown (1 of 2)]
[im 1/15]
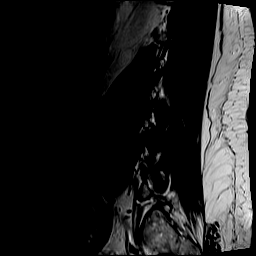
[im 3/15]
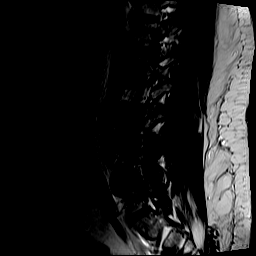
[im 6/15]
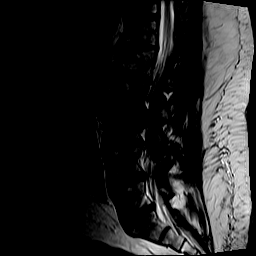
[im 9/15]
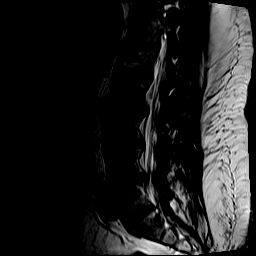
[im 12/15]
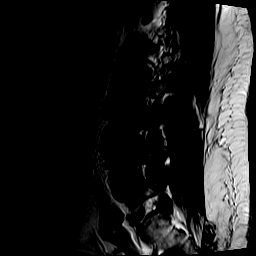
[im 15/15]
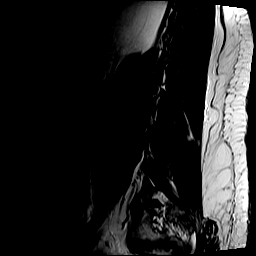

[Series 3: STIR · sagittal · 4.0mm · 1.02mm/px · 5 of 15 slices shown]
[im 1/15]
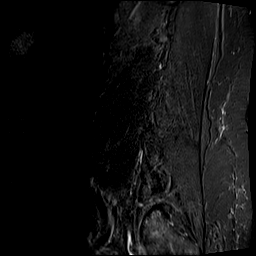
[im 4/15]
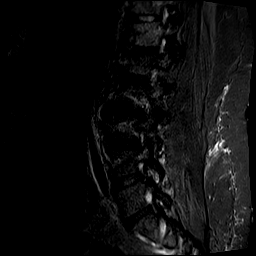
[im 8/15]
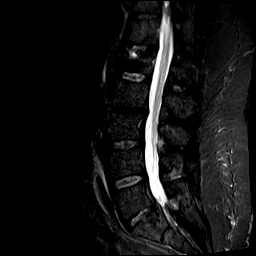
[im 11/15]
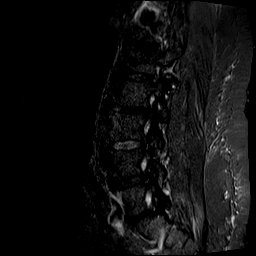
[im 15/15]
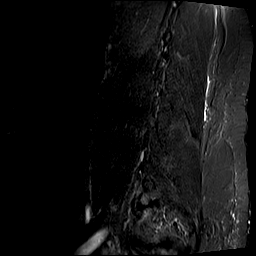

[Series 4: T1 · sagittal · 4.0mm · 1.02mm/px · 5 of 15 slices shown (1 of 2)]
[im 1/15]
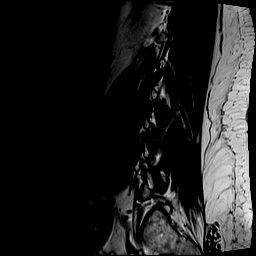
[im 4/15]
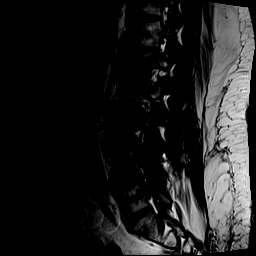
[im 8/15]
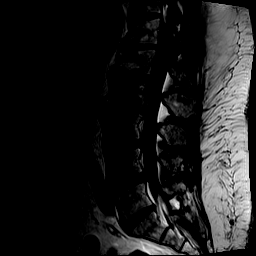
[im 11/15]
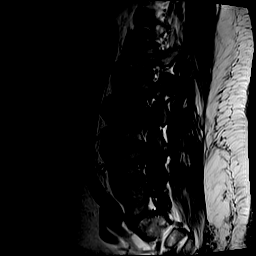
[im 15/15]
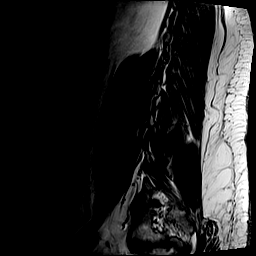

[Series 5: T2 · axial · 4.0mm · 0.78mm/px · z∈[-115,+109]mm · 13 of 40 slices shown (2 of 2)]
[im 1/40]
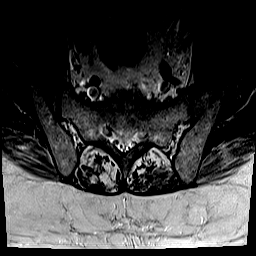
[im 4/40]
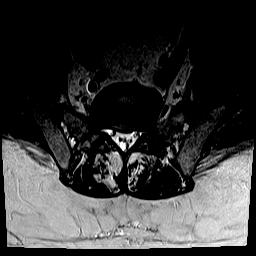
[im 7/40]
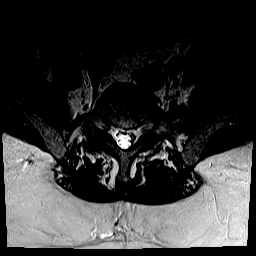
[im 10/40]
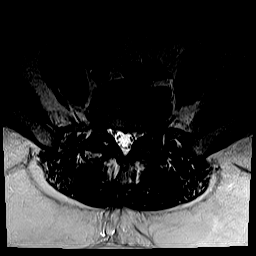
[im 14/40]
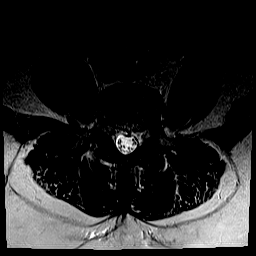
[im 17/40]
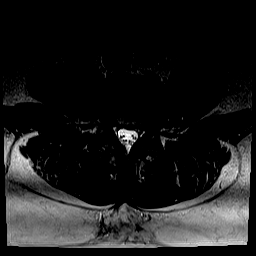
[im 20/40]
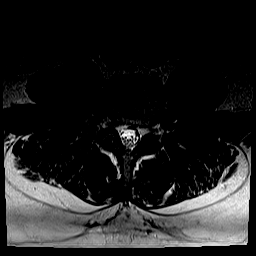
[im 23/40]
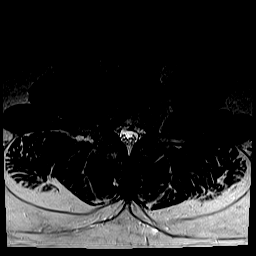
[im 27/40]
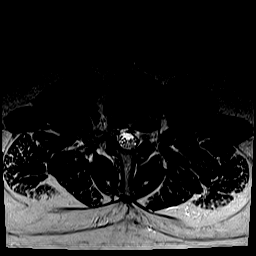
[im 30/40]
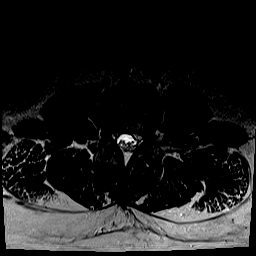
[im 33/40]
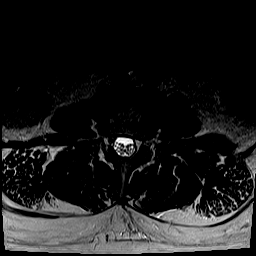
[im 36/40]
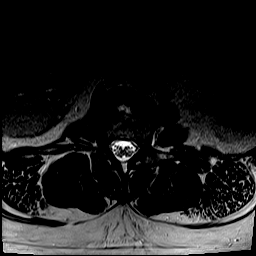
[im 40/40]
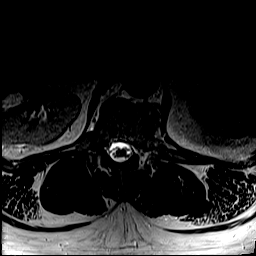

[Series 6: T1 · axial · 4.0mm · 0.78mm/px · z∈[-115,+109]mm · 12 of 40 slices shown (2 of 2)]
[im 1/40]
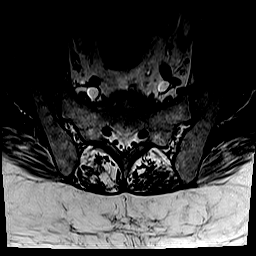
[im 4/40]
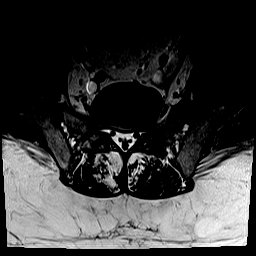
[im 7/40]
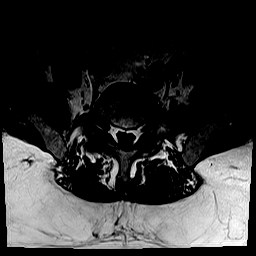
[im 10/40]
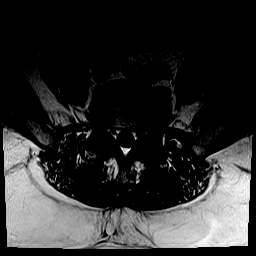
[im 14/40]
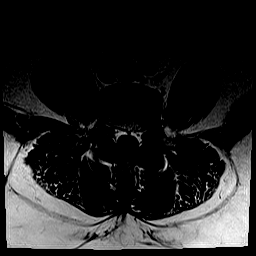
[im 17/40]
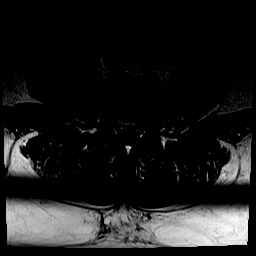
[im 20/40]
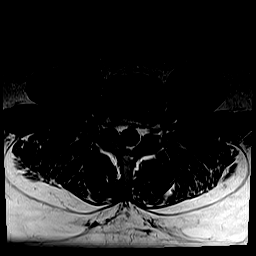
[im 23/40]
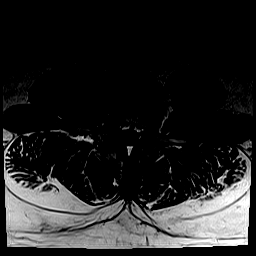
[im 27/40]
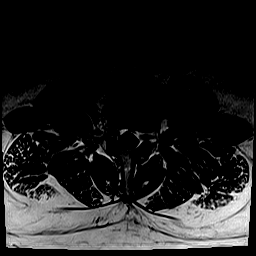
[im 30/40]
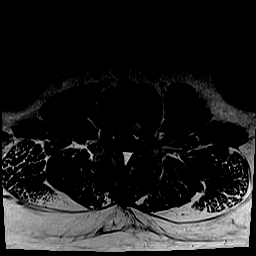
[im 33/40]
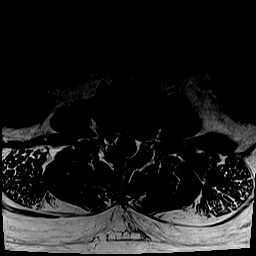
[im 40/40]
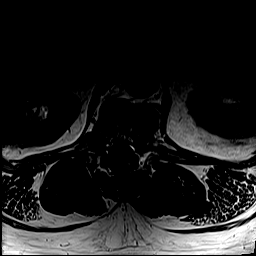

[Series 100: hx · axial · 10.0mm · 0.62mm/px · z∈[-24,+160]mm · 6 of 18 slices shown]
[im 1/18]
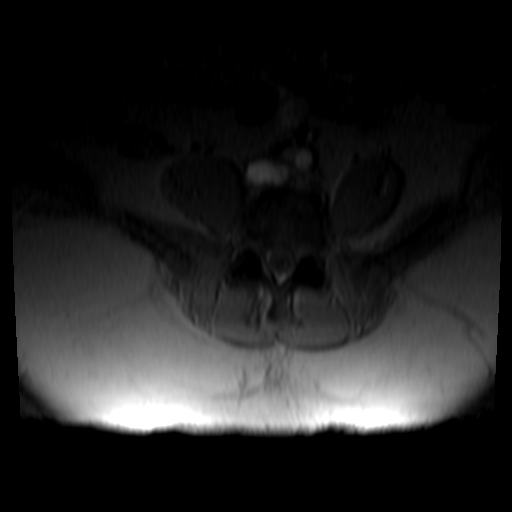
[im 4/18]
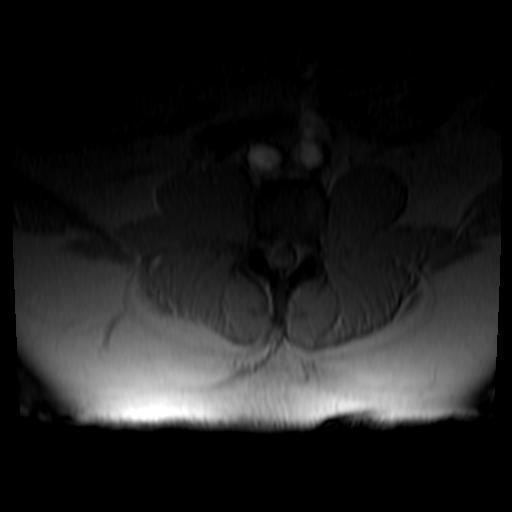
[im 7/18]
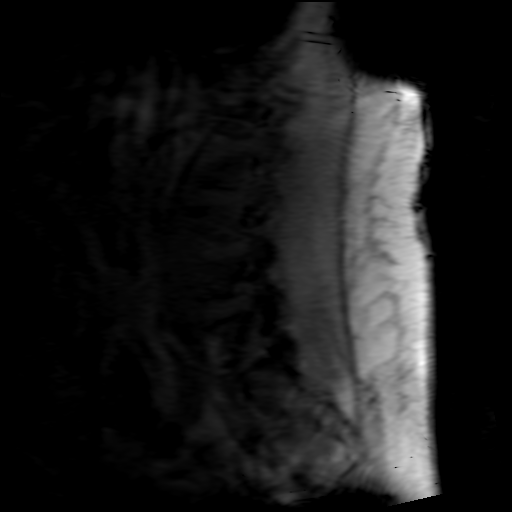
[im 11/18]
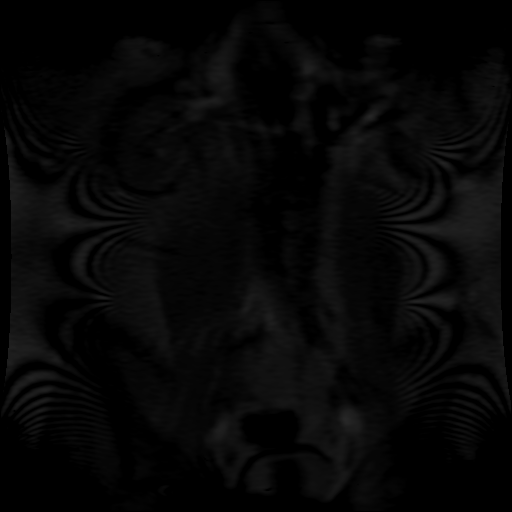
[im 14/18]
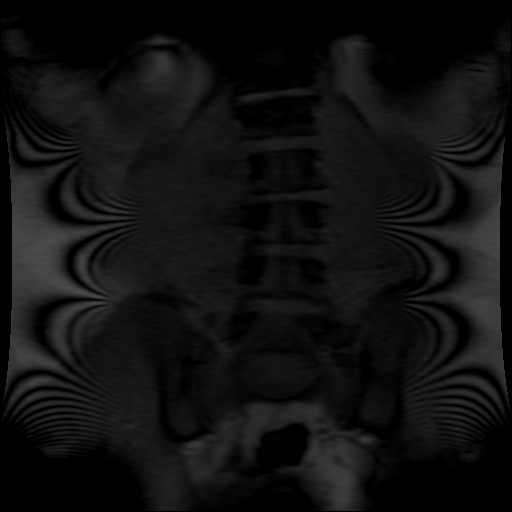
[im 18/18]
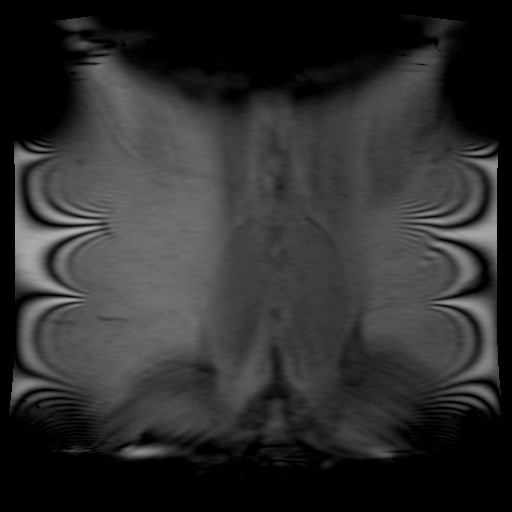

[47 of 48 positions shown; findings below may reference images not displayed]

FINDINGS: Segmentation:  Normal on the comparisons.

Alignment:  Stable lumbar lordosis since 9649.

Vertebrae: Chronic L1 superior endplate compression. Mildly
heterogeneous bone marrow signal throughout the visible spine. No
marrow edema or evidence of acute osseous abnormality. Bone marrow
signal in the visible pelvis appears normal. Intact visible sacrum
and SI joints.

Conus medullaris and cauda equina: Conus extends to the L1 level. No
lower spinal cord or conus signal abnormality.

Paraspinal and other soft tissues: Negative.

Disc levels:

T11-T12: Facet hypertrophy on the left.  No definite stenosis.

T12-L1: Chronic L1 superior endplate compression and disc space
loss. No stenosis.

L1-L2: Mild facet hypertrophy greater on the left. Mild bilateral L1
foraminal stenosis.

L2-L3: Disc desiccation, disc space loss and left eccentric
circumferential disc bulge with endplate spurring. Borderline to
mild spinal stenosis. No convincing lateral recess stenosis. Mild
bilateral L2 foraminal stenosis.

L3-L4: Mild circumferential disc bulge and endplate spurring. Mild
to moderate facet hypertrophy. No spinal or lateral recess stenosis.
Mild left and mild to moderate right L3 foraminal stenosis.

L4-L5: Mild far lateral disc bulging. Mild to moderate facet and
ligament flavum hypertrophy. No spinal or convincing lateral recess
stenosis. Mild bilateral L4 foraminal stenosis.

L5-S1:  Mild endplate spurring and facet hypertrophy.  No stenosis.
IMPRESSION: 1. Chronic L1 superior endplate compression fracture. No acute
osseous abnormality.
2. Intermittent disc and posterior element degeneration. Borderline
to mild spinal stenosis at L2-L3.
No convincing lateral recess stenosis.
Up to moderate right L3 neural foraminal stenosis. Otherwise mild
bilateral foraminal stenosis L1 through L4 nerve levels.

## 2021-04-11 ENCOUNTER — Emergency Department (HOSPITAL_BASED_OUTPATIENT_CLINIC_OR_DEPARTMENT_OTHER)
Admission: EM | Admit: 2021-04-11 | Discharge: 2021-04-11 | Disposition: A | Discharge status: Home / Self Care | Payer: Medicare Other | Attending: Emergency Medicine | Admitting: Emergency Medicine

## 2021-04-11 ENCOUNTER — Emergency Department (HOSPITAL_BASED_OUTPATIENT_CLINIC_OR_DEPARTMENT_OTHER): Payer: Medicare Other

## 2021-04-11 ENCOUNTER — Other Ambulatory Visit: Payer: Self-pay

## 2021-04-11 ENCOUNTER — Encounter (HOSPITAL_BASED_OUTPATIENT_CLINIC_OR_DEPARTMENT_OTHER): Payer: Self-pay

## 2021-04-11 DIAGNOSIS — S161XXA Strain of muscle, fascia and tendon at neck level, initial encounter: Secondary | ICD-10-CM | POA: Diagnosis not present

## 2021-04-11 DIAGNOSIS — Y92410 Unspecified street and highway as the place of occurrence of the external cause: Secondary | ICD-10-CM | POA: Insufficient documentation

## 2021-04-11 DIAGNOSIS — Z79899 Other long term (current) drug therapy: Secondary | ICD-10-CM | POA: Insufficient documentation

## 2021-04-11 DIAGNOSIS — S199XXA Unspecified injury of neck, initial encounter: Secondary | ICD-10-CM | POA: Diagnosis present

## 2021-04-11 MED ORDER — METHOCARBAMOL 500 MG PO TABS: 500.0000 mg | ORAL_TABLET | Freq: Two times a day (BID) | ORAL | 0 refills | Status: DC

## 2021-04-11 MED ORDER — NAPROXEN 500 MG PO TABS: 500.0000 mg | ORAL_TABLET | Freq: Two times a day (BID) | ORAL | 0 refills | Status: DC

## 2021-04-11 MED ORDER — IBUPROFEN 800 MG PO TABS
800.0000 mg | ORAL_TABLET | Freq: Once | ORAL | Status: AC
  Administered 2021-04-11: 800 mg via ORAL
  Filled 2021-04-11: qty 1
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
## 2021-08-28 ENCOUNTER — Encounter (HOSPITAL_BASED_OUTPATIENT_CLINIC_OR_DEPARTMENT_OTHER): Payer: Self-pay

## 2021-08-28 ENCOUNTER — Emergency Department (HOSPITAL_BASED_OUTPATIENT_CLINIC_OR_DEPARTMENT_OTHER)
Admission: EM | Admit: 2021-08-28 | Discharge: 2021-08-28 | Disposition: A | Discharge status: Home / Self Care | Payer: 59 | Attending: Emergency Medicine | Admitting: Emergency Medicine

## 2021-08-28 DIAGNOSIS — Z7984 Long term (current) use of oral hypoglycemic drugs: Secondary | ICD-10-CM | POA: Insufficient documentation

## 2021-08-28 DIAGNOSIS — R739 Hyperglycemia, unspecified: Secondary | ICD-10-CM

## 2021-08-28 DIAGNOSIS — E860 Dehydration: Secondary | ICD-10-CM | POA: Diagnosis not present

## 2021-08-28 DIAGNOSIS — E1165 Type 2 diabetes mellitus with hyperglycemia: Secondary | ICD-10-CM | POA: Insufficient documentation

## 2021-08-28 LAB — CBC WITH DIFFERENTIAL/PLATELET
Abs Immature Granulocytes: 0.02 10*3/uL (ref 0.00–0.07)
Basophils Absolute: 0 10*3/uL (ref 0.0–0.1)
Basophils Relative: 0 %
Eosinophils Absolute: 0.1 10*3/uL (ref 0.0–0.5)
Eosinophils Relative: 2 %
HCT: 42.6 % (ref 39.0–52.0)
Hemoglobin: 14.7 g/dL (ref 13.0–17.0)
Immature Granulocytes: 0 %
Lymphocytes Relative: 39 %
Lymphs Abs: 2.4 10*3/uL (ref 0.7–4.0)
MCH: 26.8 pg (ref 26.0–34.0)
MCHC: 34.5 g/dL (ref 30.0–36.0)
MCV: 77.7 fL — ABNORMAL LOW (ref 80.0–100.0)
Monocytes Absolute: 0.4 10*3/uL (ref 0.1–1.0)
Monocytes Relative: 6 %
Neutro Abs: 3.3 10*3/uL (ref 1.7–7.7)
Neutrophils Relative %: 53 %
Platelets: 183 10*3/uL (ref 150–400)
RBC: 5.48 MIL/uL (ref 4.22–5.81)
RDW: 14 % (ref 11.5–15.5)
WBC: 6.2 10*3/uL (ref 4.0–10.5)
nRBC: 0 % (ref 0.0–0.2)

## 2021-08-28 LAB — I-STAT VENOUS BLOOD GAS, ED
Acid-Base Excess: 3 mmol/L — ABNORMAL HIGH (ref 0.0–2.0)
Bicarbonate: 27 mmol/L (ref 20.0–28.0)
Calcium, Ion: 1.21 mmol/L (ref 1.15–1.40)
HCT: 42 % (ref 39.0–52.0)
Hemoglobin: 14.3 g/dL (ref 13.0–17.0)
O2 Saturation: 91 %
Patient temperature: 98.3
Potassium: 3.9 mmol/L (ref 3.5–5.1)
Sodium: 140 mmol/L (ref 135–145)
TCO2: 28 mmol/L (ref 22–32)
pCO2, Ven: 38.9 mmHg — ABNORMAL LOW (ref 44–60)
pH, Ven: 7.449 — ABNORMAL HIGH (ref 7.25–7.43)
pO2, Ven: 58 mmHg — ABNORMAL HIGH (ref 32–45)

## 2021-08-28 LAB — COMPREHENSIVE METABOLIC PANEL
ALT: 19 U/L (ref 0–44)
AST: 18 U/L (ref 15–41)
Albumin: 3.4 g/dL — ABNORMAL LOW (ref 3.5–5.0)
Alkaline Phosphatase: 77 U/L (ref 38–126)
Anion gap: 6 (ref 5–15)
BUN: 17 mg/dL (ref 8–23)
CO2: 26 mmol/L (ref 22–32)
Calcium: 9.3 mg/dL (ref 8.9–10.3)
Chloride: 106 mmol/L (ref 98–111)
Creatinine, Ser: 1.28 mg/dL — ABNORMAL HIGH (ref 0.61–1.24)
GFR, Estimated: >60 mL/min (ref >60)
Glucose, Bld: 223 mg/dL — ABNORMAL HIGH (ref 70–99)
Potassium: 3.8 mmol/L (ref 3.5–5.1)
Sodium: 138 mmol/L (ref 135–145)
Total Bilirubin: 0.4 mg/dL (ref 0.3–1.2)
Total Protein: 6.9 g/dL (ref 6.5–8.1)

## 2021-08-28 LAB — CBG MONITORING, ED: Glucose-Capillary: 221 mg/dL — ABNORMAL HIGH (ref 70–99)

## 2021-08-28 MED ORDER — SODIUM CHLORIDE 0.9 % IV BOLUS
1000.0000 mL | Freq: Once | INTRAVENOUS | Status: AC
  Administered 2021-08-28: 1000 mL via INTRAVENOUS

## 2021-08-28 MED ORDER — SODIUM CHLORIDE 0.9 % IV BOLUS: 2000.0000 mL | Freq: Once | INTRAVENOUS | Status: DC

## 2021-08-28 MED ORDER — INSULIN ASPART 100 UNIT/ML IJ SOLN: 10.0000 [IU] | Freq: Once | INTRAMUSCULAR | Status: DC

## 2021-08-28 MED ORDER — METFORMIN HCL 500 MG PO TABS: 1000.0000 mg | ORAL_TABLET | Freq: Once | ORAL | Status: DC

## 2021-08-28 MED ORDER — INSULIN ASPART 100 UNIT/ML IJ SOLN: 5.0000 [IU] | Freq: Once | INTRAMUSCULAR | Status: DC

## 2021-08-28 MED ORDER — SODIUM CHLORIDE 0.9 % IV BOLUS: 1000.0000 mL | Freq: Once | INTRAVENOUS | Status: DC

## 2021-08-28 MED ORDER — GLIPIZIDE 5 MG PO TABS: 5.0000 mg | ORAL_TABLET | Freq: Once | ORAL | Status: DC
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
## 2021-08-28 NOTE — Discharge Instructions (Signed)
Take jardiance and glipizide as prescribed by your doctor.  Stay hydrated.  Avoid any sugary drinks  See your doctor for follow-up   Return to ER if you have glucose greater than 500, abdominal pain or vomiting

## 2021-08-28 NOTE — ED Notes (Signed)
Dr Darl Householder at bedside. Fluids still infusing

## 2022-03-10 DIAGNOSIS — E1142 Type 2 diabetes mellitus with diabetic polyneuropathy: Secondary | ICD-10-CM | POA: Diagnosis not present

## 2022-03-10 DIAGNOSIS — Z794 Long term (current) use of insulin: Secondary | ICD-10-CM | POA: Diagnosis not present

## 2022-03-10 DIAGNOSIS — N3001 Acute cystitis with hematuria: Secondary | ICD-10-CM | POA: Diagnosis not present

## 2022-03-10 DIAGNOSIS — E782 Mixed hyperlipidemia: Secondary | ICD-10-CM | POA: Diagnosis not present

## 2022-03-10 DIAGNOSIS — N289 Disorder of kidney and ureter, unspecified: Secondary | ICD-10-CM | POA: Diagnosis not present

## 2022-03-10 DIAGNOSIS — E119 Type 2 diabetes mellitus without complications: Secondary | ICD-10-CM | POA: Diagnosis not present

## 2022-03-27 ENCOUNTER — Emergency Department (HOSPITAL_BASED_OUTPATIENT_CLINIC_OR_DEPARTMENT_OTHER): Payer: 59

## 2022-03-27 ENCOUNTER — Emergency Department (HOSPITAL_BASED_OUTPATIENT_CLINIC_OR_DEPARTMENT_OTHER)
Admission: EM | Admit: 2022-03-27 | Discharge: 2022-03-27 | Disposition: A | Discharge status: Home / Self Care | Payer: 59 | Attending: Emergency Medicine | Admitting: Emergency Medicine

## 2022-03-27 ENCOUNTER — Other Ambulatory Visit: Payer: Self-pay

## 2022-03-27 DIAGNOSIS — F17210 Nicotine dependence, cigarettes, uncomplicated: Secondary | ICD-10-CM | POA: Insufficient documentation

## 2022-03-27 DIAGNOSIS — R600 Localized edema: Secondary | ICD-10-CM | POA: Diagnosis not present

## 2022-03-27 DIAGNOSIS — M79672 Pain in left foot: Secondary | ICD-10-CM | POA: Insufficient documentation

## 2022-03-27 DIAGNOSIS — W230XXA Caught, crushed, jammed, or pinched between moving objects, initial encounter: Secondary | ICD-10-CM | POA: Insufficient documentation

## 2022-03-27 DIAGNOSIS — S6992XA Unspecified injury of left wrist, hand and finger(s), initial encounter: Secondary | ICD-10-CM | POA: Diagnosis present

## 2022-03-27 DIAGNOSIS — S60022A Contusion of left index finger without damage to nail, initial encounter: Secondary | ICD-10-CM | POA: Diagnosis not present

## 2022-03-27 DIAGNOSIS — M7989 Other specified soft tissue disorders: Secondary | ICD-10-CM | POA: Diagnosis not present

## 2022-03-27 DIAGNOSIS — L84 Corns and callosities: Secondary | ICD-10-CM | POA: Diagnosis not present

## 2022-03-27 DIAGNOSIS — Z7984 Long term (current) use of oral hypoglycemic drugs: Secondary | ICD-10-CM | POA: Diagnosis not present

## 2022-03-27 DIAGNOSIS — E119 Type 2 diabetes mellitus without complications: Secondary | ICD-10-CM | POA: Diagnosis not present

## 2022-03-27 DIAGNOSIS — Y92810 Car as the place of occurrence of the external cause: Secondary | ICD-10-CM | POA: Diagnosis not present

## 2022-03-27 DIAGNOSIS — M79645 Pain in left finger(s): Secondary | ICD-10-CM | POA: Diagnosis not present

## 2022-03-27 DIAGNOSIS — J45909 Unspecified asthma, uncomplicated: Secondary | ICD-10-CM | POA: Diagnosis not present

## 2022-03-27 MED ORDER — GABAPENTIN 300 MG PO CAPS: 300.0000 mg | ORAL_CAPSULE | Freq: Three times a day (TID) | ORAL | 0 refills | Status: DC

## 2022-03-27 MED ORDER — ACETAMINOPHEN 500 MG PO TABS
1000.0000 mg | ORAL_TABLET | Freq: Once | ORAL | Status: AC
  Administered 2022-03-27: 1000 mg via ORAL
  Filled 2022-03-27: qty 2

## 2022-03-27 MED ORDER — GABAPENTIN 300 MG PO CAPS
300.0000 mg | ORAL_CAPSULE | Freq: Once | ORAL | Status: AC
  Administered 2022-03-27: 300 mg via ORAL
  Filled 2022-03-27: qty 1

## 2022-03-27 NOTE — ED Provider Notes (Signed)
[place] [facility] [place] Provider Note  CSN: [id number] Arrival date & time: 03/27/22 1959  Chief Complaint(s) Foot Pain and Hand Pain  HPI Lee Reid is a 67 y.o. male history of diabetes, peripheral artery disease presenting to the emergency department with left hand pain.  Reports he slammed in the car door and wanted to get checked out since he is a diabetic.  He reports his left index finger is only 1 that hurts.  Patient also complains of left foot pain, reports this has been going on for months, has been trying to see podiatrist for 4 to 5 months.  He reports he has a callus on the bottom of his foot which hurts.  He was previously prescribed gabapentin and reports this helped some.  He has also taken Tylenol without significant improvement.  No fevers or chills.  No new wounds.   Past Medical History Past Medical History:  Diagnosis Date   Anginal pain ([place])    Anxiety    Asthma    Chronic back pain    Depression    Diabetes mellitus without complication ([place])    Dysrhythmia    irregular heartbeat due to stress   GERD (gastroesophageal reflux disease)    Heart murmur    born with heart murmur   Pneumonia    Patient Active Problem List   Diagnosis Date Noted   Peripheral arterial disease ([place]) 07/15/2017   ARF (acute renal failure) ([place]) 09/28/2014   Abdominal wall cellulitis 09/27/2014   Cellulitis of abdominal wall 09/27/2014   S/P cholecystectomy 09/27/2014   Chronic cholecystitis 09/16/2014   Preoperative clearance 08/29/2014   Type 2 diabetes mellitus without complication ([place]) [id number]   Type 2 diabetes mellitus without complications ([place]) [id number]   Morbid obesity ([place]) 07/29/2014   Colon cancer screening 07/29/2014   Home Medication(s) Prior to Admission medications   Medication Sig Start Date End Date Taking? Authorizing Provider  gabapentin (NEURONTIN) 300 MG capsule Take 1 capsule (300 mg total) by mouth 3  (three) times daily. 03/27/22 04/26/22 Yes Cristie Hem, MD  albuterol (VENTOLIN HFA) 108 (90 Base) MCG/ACT inhaler Inhale into the lungs every 6 (six) hours as needed for wheezing or shortness of breath.    [provider]  celecoxib (CELEBREX) 200 MG capsule Take 1 capsule (200 mg total) by mouth 2 (two) times daily. 03/28/18   Isla Pence, MD  clotrimazole (LOTRIMIN) 1 % cream Apply to affected area 2 times daily 04/09/18   Davonna Belling, MD  Empagliflozin (JARDIANCE PO) Take by mouth.    [provider]  HYDROcodone-acetaminophen (NORCO/VICODIN) 5-325 MG tablet Take 1 tablet by mouth every 4 (four) hours as needed. 03/28/18   Isla Pence, MD  magic mouthwash SOLN Take 5 mLs by mouth 3 (three) times daily as needed for mouth pain. 04/09/18   Long, Wonda Olds, MD  metFORMIN (GLUCOPHAGE) 1000 MG tablet Take 1 tablet (1,000 mg total) by mouth 2 (two) times daily. 10/04/19   Truddie Hidden, MD  methocarbamol (ROBAXIN) 500 MG tablet Take 1 tablet (500 mg total) by mouth 2 (two) times daily. 04/11/21   Tonye Pearson, PA-C  naproxen (NAPROSYN) 500 MG tablet Take 1 tablet (500 mg total) by mouth 2 (two) times daily. 04/11/21   Tonye Pearson, PA-C  Past Surgical History Past Surgical History:  Procedure Laterality Date   CHOLECYSTECTOMY N/A 09/16/2014   Procedure: LAPAROSCOPIC CHOLECYSTECTOMY ;  Surgeon: Arta Bruce Kinsinger, MD;  Location: [facility];  Service: General;  Laterality: N/A;   EYE SURGERY     TESTICLE SURGERY     growth removed   TONSILLECTOMY     Family History Family History  Problem Relation Age of Onset   Hypertension Mother    Heart disease Father    Cancer Father     Social History Social History   Tobacco Use   Smoking status: Every Day    Packs/day: 0.25    Types: Cigarettes   Smokeless tobacco: Never   Vaping Use   Vaping Use: Former  Substance Use Topics   Alcohol use: Not Currently    Comment: occ   Drug use: No   Allergies Bee venom  Review of Systems Review of Systems  All other systems reviewed and are negative.   Physical Exam Vital Signs  I have reviewed the triage vital signs BP 126/78   Pulse 86   Temp 98.5 F (36.9 C) (Oral)   Resp 18   SpO2 94%  Physical Exam Vitals and nursing note reviewed.  Constitutional:      General: He is not in acute distress.    Appearance: Normal appearance.  HENT:     Head: Normocephalic and atraumatic.     Mouth/Throat:     Mouth: Mucous membranes are moist.  Eyes:     Conjunctiva/sclera: Conjunctivae normal.  Cardiovascular:     Rate and Rhythm: Normal rate.  Pulmonary:     Effort: Pulmonary effort is normal. No respiratory distress.  Abdominal:     General: Abdomen is flat.  Musculoskeletal:     Comments: 2+ bilateral DP pulses.  Left foot with large callus to the sole of the foot.  No obvious wounds.  Left hand with no tenderness other than mild tenderness to the dorsum of the left middle finger at the DIP joint.  Full active range of motion with flexion and extension at the MCP, PIP and DIP joints of the left middle finger.  No other tenderness to the hand.  No snuffbox tenderness.  Less than 1 cm abrasion to the area of tenderness on the left middle finger.  Skin:    General: Skin is warm and dry.     Capillary Refill: Capillary refill takes less than 2 seconds.  Neurological:     General: No focal deficit present.     Mental Status: He is alert. Mental status is at baseline.  Psychiatric:        Mood and Affect: Mood normal.        Behavior: Behavior normal.     ED Results and Treatments Labs (all labs ordered are listed, but only abnormal results are displayed) Labs Reviewed - No data to display  Radiology DG Finger Middle Left  Result Date: 03/27/2022 CLINICAL DATA:  Pain in the left middle finger. EXAM: LEFT MIDDLE FINGER 2+V COMPARISON:  None Available. FINDINGS: No acute fracture or dislocation. Faint curvilinear density along the dorsal aspect of the base of the middle phalanx, likely chronic. The bones are well mineralized. No significant arthritic changes. Mild soft tissue swelling. No radiopaque foreign object or soft tissue gas. IMPRESSION: 1. No acute fracture or dislocation. 2. Mild soft tissue swelling. Electronically Signed   By: Anner Crete M.D.   On: 03/27/2022 21:24   DG Foot Complete Left  Result Date: 03/27/2022 CLINICAL DATA:  swelling on Lateral side, pain EXAM: LEFT FOOT - COMPLETE 3+ VIEW COMPARISON:  None Available. FINDINGS: There is no evidence of fracture or dislocation. Vague curvilinear lucency along the fifth metatarsal base on lateral view of unclear etiology. Mild to moderate degenerative changes of the forefoot and midfoot. Lateral mid foot subcutaneus soft tissue edema. IMPRESSION: 1. No acute displaced fracture or dislocation. 2. Vague curvilinear lucency along the fifth metatarsal base on lateral view of unclear etiology. Electronically Signed   By: Iven Finn M.D.   On: 03/27/2022 20:44    Pertinent labs & imaging results that were available during my care of the patient were reviewed by me and considered in my medical decision making (see MDM for details).  Medications Ordered in ED Medications  gabapentin (NEURONTIN) capsule 300 mg (300 mg Oral Given 03/27/22 2121)  acetaminophen (TYLENOL) tablet 1,000 mg (1,000 mg Oral Given 03/27/22 2121)                                                                                                                                     Procedures Procedures  (including critical care time)  Medical Decision Making / ED Course   MDM:  67 year old male presenting to the emergency department with finger  pain and foot pain.  Patient reports finger pain after slamming his finger in a car door.  He does have some tenderness over his DIP joint so we will obtain x-ray.  Otherwise hand exam is reassuring with no other traumatic findings.  Patient also reports chronic left foot pain.  He has callus formation and is a diabetic.  He would benefit from seeing podiatry for further footcare.  He has no active wounds.  Will provide information for podiatry.  X-ray without evidence of acute fracture.  Clinical Course as of 03/27/22 2208  Sat Mar 27, 2022  2207 Hand x-ray negative. Will discharge patient to home. All questions answered. Patient comfortable with plan of discharge. Return precautions discussed with patient and specified on the after visit summary. [WS]    Clinical Course User Index [WS] Cristie Hem, MD     Additional history obtained: -Additional history obtained from spouse -External records from outside source obtained and reviewed including: Chart review including previous notes, labs, imaging, consultation notes including  ED visit 08/28/21   Imaging Studies ordered: I ordered imaging studies including XR left foot, XR left middle finger On my interpretation imaging demonstrates no fracture I independently visualized and interpreted imaging. I agree with the radiologist interpretation   Medicines ordered and prescription drug management: Meds ordered this encounter  Medications   gabapentin (NEURONTIN) capsule 300 mg   acetaminophen (TYLENOL) tablet 1,000 mg   gabapentin (NEURONTIN) 300 MG capsule    Sig: Take 1 capsule (300 mg total) by mouth 3 (three) times daily.    Dispense:  90 capsule    Refill:  0    -I have reviewed the patients home medicines and have made adjustments as needed   Social Determinants of Health:  Diagnosis or treatment significantly limited by social determinants of health: obesity   Reevaluation: After the interventions noted above, I  reevaluated the patient and found that their symptoms have improved  Co morbidities that complicate the patient evaluation  Past Medical History:  Diagnosis Date   Anginal pain ([place])    Anxiety    Asthma    Chronic back pain    Depression    Diabetes mellitus without complication ([place])    Dysrhythmia    irregular heartbeat due to stress   GERD (gastroesophageal reflux disease)    Heart murmur    born with heart murmur   Pneumonia       Dispostion: Disposition decision including need for hospitalization was considered, and patient discharged from emergency department.    Final Clinical Impression(s) / ED Diagnoses Final diagnoses:  Callus of foot  Contusion of left index finger without damage to nail, initial encounter     This chart was dictated using voice recognition software.  Despite best efforts to proofread,  errors can occur which can change the documentation meaning.    Cristie Hem, MD 03/27/22 2208

## 2022-03-27 NOTE — Discharge Instructions (Addendum)
We evaluated you for your hand injury and your foot pain.  Your x-rays did not show any fractures.  Please take 1000 mg of Tylenol every 6 hours for your foot pain.  I also prescribed you gabapentin, this can help with diabetic neuropathy pain.  Please take 300 mg every 8 hours as needed for pain.  Please call podiatry for a follow-up appointment.  They can help remove your callus and trim your nails.  You may benefit from having orthotics to help prevent further callus formation.

## 2022-03-27 NOTE — ED Triage Notes (Signed)
Pt is here for L hand pain after car door closed on his hand this evening. Movement and sensation intact, no obvious deformity, some bruising noted to distal middle finger. Pt reports L foot pain, states it has been off and on for over a month. Pt reports callus to bottom of foot, a "knot" growing on the lateral part of his foot. Pt hasn't been able to see a podiatrist. Pt is diabetic and states bottom of his foot is numb.

## 2022-04-19 DIAGNOSIS — E1142 Type 2 diabetes mellitus with diabetic polyneuropathy: Secondary | ICD-10-CM | POA: Diagnosis not present

## 2022-04-19 DIAGNOSIS — Z0001 Encounter for general adult medical examination with abnormal findings: Secondary | ICD-10-CM | POA: Diagnosis not present

## 2022-04-19 DIAGNOSIS — Z794 Long term (current) use of insulin: Secondary | ICD-10-CM | POA: Diagnosis not present

## 2022-04-19 DIAGNOSIS — E119 Type 2 diabetes mellitus without complications: Secondary | ICD-10-CM | POA: Diagnosis not present

## 2022-04-19 DIAGNOSIS — E782 Mixed hyperlipidemia: Secondary | ICD-10-CM | POA: Diagnosis not present

## 2022-04-27 ENCOUNTER — Encounter (HOSPITAL_BASED_OUTPATIENT_CLINIC_OR_DEPARTMENT_OTHER): Payer: Self-pay | Admitting: Urology

## 2022-04-27 ENCOUNTER — Emergency Department (HOSPITAL_BASED_OUTPATIENT_CLINIC_OR_DEPARTMENT_OTHER)
Admission: EM | Admit: 2022-04-27 | Discharge: 2022-04-28 | Disposition: A | Discharge status: Home / Self Care | Payer: 59 | Attending: Emergency Medicine | Admitting: Emergency Medicine

## 2022-04-27 ENCOUNTER — Emergency Department (HOSPITAL_COMMUNITY): Payer: 59

## 2022-04-27 ENCOUNTER — Emergency Department (HOSPITAL_BASED_OUTPATIENT_CLINIC_OR_DEPARTMENT_OTHER): Payer: 59

## 2022-04-27 ENCOUNTER — Other Ambulatory Visit: Payer: Self-pay

## 2022-04-27 DIAGNOSIS — R32 Unspecified urinary incontinence: Secondary | ICD-10-CM | POA: Insufficient documentation

## 2022-04-27 DIAGNOSIS — E119 Type 2 diabetes mellitus without complications: Secondary | ICD-10-CM | POA: Diagnosis not present

## 2022-04-27 DIAGNOSIS — N12 Tubulo-interstitial nephritis, not specified as acute or chronic: Secondary | ICD-10-CM | POA: Diagnosis not present

## 2022-04-27 DIAGNOSIS — N3289 Other specified disorders of bladder: Secondary | ICD-10-CM | POA: Diagnosis not present

## 2022-04-27 DIAGNOSIS — M47816 Spondylosis without myelopathy or radiculopathy, lumbar region: Secondary | ICD-10-CM | POA: Diagnosis not present

## 2022-04-27 DIAGNOSIS — R1030 Lower abdominal pain, unspecified: Secondary | ICD-10-CM | POA: Diagnosis not present

## 2022-04-27 DIAGNOSIS — R109 Unspecified abdominal pain: Secondary | ICD-10-CM | POA: Diagnosis not present

## 2022-04-27 DIAGNOSIS — R531 Weakness: Secondary | ICD-10-CM | POA: Diagnosis not present

## 2022-04-27 DIAGNOSIS — Z7984 Long term (current) use of oral hypoglycemic drugs: Secondary | ICD-10-CM | POA: Insufficient documentation

## 2022-04-27 DIAGNOSIS — M47814 Spondylosis without myelopathy or radiculopathy, thoracic region: Secondary | ICD-10-CM | POA: Diagnosis not present

## 2022-04-27 LAB — CBC WITH DIFFERENTIAL/PLATELET
Abs Immature Granulocytes: 0.03 10*3/uL (ref 0.00–0.07)
Basophils Absolute: 0 10*3/uL (ref 0.0–0.1)
Basophils Relative: 0 %
Eosinophils Absolute: 0.1 10*3/uL (ref 0.0–0.5)
Eosinophils Relative: 1 %
HCT: 43.5 % (ref 39.0–52.0)
Hemoglobin: 14.8 g/dL (ref 13.0–17.0)
Immature Granulocytes: 1 %
Lymphocytes Relative: 29 %
Lymphs Abs: 1.8 10*3/uL (ref 0.7–4.0)
MCH: 26.5 pg (ref 26.0–34.0)
MCHC: 34 g/dL (ref 30.0–36.0)
MCV: 78 fL — ABNORMAL LOW (ref 80.0–100.0)
Monocytes Absolute: 0.3 10*3/uL (ref 0.1–1.0)
Monocytes Relative: 4 %
Neutro Abs: 4.1 10*3/uL (ref 1.7–7.7)
Neutrophils Relative %: 65 %
Platelets: 159 10*3/uL (ref 150–400)
RBC: 5.58 MIL/uL (ref 4.22–5.81)
RDW: 13.9 % (ref 11.5–15.5)
WBC: 6.4 10*3/uL (ref 4.0–10.5)
nRBC: 0 % (ref 0.0–0.2)

## 2022-04-27 LAB — URINALYSIS, ROUTINE W REFLEX MICROSCOPIC
Bilirubin Urine: NEGATIVE
Glucose, UA: >=500 mg/dL — AB
Ketones, ur: NEGATIVE mg/dL
Nitrite: NEGATIVE
Protein, ur: NEGATIVE mg/dL
Specific Gravity, Urine: 1.025 (ref 1.005–1.030)
pH: 5.5 (ref 5.0–8.0)

## 2022-04-27 LAB — BASIC METABOLIC PANEL
Anion gap: 8 (ref 5–15)
BUN: 14 mg/dL (ref 8–23)
CO2: 27 mmol/L (ref 22–32)
Calcium: 8.8 mg/dL — ABNORMAL LOW (ref 8.9–10.3)
Chloride: 101 mmol/L (ref 98–111)
Creatinine, Ser: 1.07 mg/dL (ref 0.61–1.24)
GFR, Estimated: >60 mL/min (ref >60)
Glucose, Bld: 317 mg/dL — ABNORMAL HIGH (ref 70–99)
Potassium: 3.5 mmol/L (ref 3.5–5.1)
Sodium: 136 mmol/L (ref 135–145)

## 2022-04-27 LAB — URINALYSIS, MICROSCOPIC (REFLEX)

## 2022-04-27 LAB — C-REACTIVE PROTEIN: CRP: 1.4 mg/dL — ABNORMAL HIGH (ref <1.0)

## 2022-04-27 LAB — SEDIMENTATION RATE: Sed Rate: 17 mm/hr — ABNORMAL HIGH (ref 0–16)

## 2022-04-27 MED ORDER — CEFPODOXIME PROXETIL 200 MG PO TABS: 200.0000 mg | ORAL_TABLET | Freq: Two times a day (BID) | ORAL | 0 refills | Status: AC

## 2022-04-27 MED ORDER — LIDOCAINE 5 % EX PTCH
1.0000 | MEDICATED_PATCH | CUTANEOUS | Status: DC
  Administered 2022-04-27: 1 via TRANSDERMAL
  Filled 2022-04-27: qty 1

## 2022-04-27 MED ORDER — SODIUM CHLORIDE 0.9 % IV BOLUS
1000.0000 mL | Freq: Once | INTRAVENOUS | Status: AC
  Administered 2022-04-27: 1000 mL via INTRAVENOUS

## 2022-04-27 MED ORDER — LORAZEPAM 1 MG PO TABS: 1.0000 mg | ORAL_TABLET | ORAL | Status: DC | PRN

## 2022-04-27 MED ORDER — SODIUM CHLORIDE 0.9 % IV SOLN
1.0000 g | Freq: Once | INTRAVENOUS | Status: AC
  Administered 2022-04-27: 1 g via INTRAVENOUS
  Filled 2022-04-27: qty 10

## 2022-04-27 MED ORDER — LACTATED RINGERS IV BOLUS: 1000.0000 mL | Freq: Once | INTRAVENOUS | Status: DC

## 2022-04-27 MED ORDER — GADOBUTROL 1 MMOL/ML IV SOLN
10.0000 mL | Freq: Once | INTRAVENOUS | Status: AC | PRN
  Administered 2022-04-27: 10 mL via INTRAVENOUS

## 2022-04-27 NOTE — ED Notes (Signed)
Recliner for patient

## 2022-04-27 NOTE — ED Provider Notes (Signed)
[place] [place] [facility] Provider Note   CSN: [id number] Arrival date & time: 04/27/22  1533     History  Chief Complaint  Patient presents with   Flank Pain    Lee Reid is a 67 y.o. male.   Flank Pain     67 year old male with medical history significant for DM 2, anxiety, depression, GERD, chronic back pain with degenerative disc disease in the lower lumbar spine who presents to the emergency department with multiple complaints.  The patient states that over the last several days he has had burning while urinating, urgency, increased urinary frequency with subsequent development of sharp left-sided flank pain.  He also states that he has been having acute on chronic back pain and has noticed over the last 3 to 4 days bilateral leg heaviness.  He endorses pain down his left leg that only occurs while urinating. He has a hx of BPH, denies problems with retention today, able to void, but states that he does experience symptoms of incomplete emptying. No fevers. No abdominal pain, nausea or vomiting.  Home Medications Prior to Admission medications   Medication Sig Start Date End Date Taking? Authorizing Provider  cefpodoxime (VANTIN) 200 MG tablet Take 1 tablet (200 mg total) by mouth 2 (two) times daily for 10 days. 04/27/22 05/07/22 Yes Ernie Avena, MD  albuterol (VENTOLIN HFA) 108 (90 Base) MCG/ACT inhaler Inhale into the lungs every 6 (six) hours as needed for wheezing or shortness of breath.    [provider]  celecoxib (CELEBREX) 200 MG capsule Take 1 capsule (200 mg total) by mouth 2 (two) times daily. 03/28/18   Jacalyn Lefevre, MD  clotrimazole (LOTRIMIN) 1 % cream Apply to affected area 2 times daily 04/09/18   Benjiman Core, MD  Empagliflozin (JARDIANCE PO) Take by mouth.    [provider]  gabapentin (NEURONTIN) 300 MG capsule Take 1 capsule (300 mg total) by mouth 3 (three) times daily. 03/27/22 04/26/22  Lonell Grandchild, MD  HYDROcodone-acetaminophen (NORCO/VICODIN) 5-325 MG tablet Take 1 tablet by mouth every 4 (four) hours as needed. 03/28/18   Jacalyn Lefevre, MD  magic mouthwash SOLN Take 5 mLs by mouth 3 (three) times daily as needed for mouth pain. 04/09/18   Long, Arlyss Repress, MD  metFORMIN (GLUCOPHAGE) 1000 MG tablet Take 1 tablet (1,000 mg total) by mouth 2 (two) times daily. 10/04/19   Pollyann Savoy, MD  methocarbamol (ROBAXIN) 500 MG tablet Take 1 tablet (500 mg total) by mouth 2 (two) times daily. 04/11/21   Janell Quiet, PA-C  naproxen (NAPROSYN) 500 MG tablet Take 1 tablet (500 mg total) by mouth 2 (two) times daily. 04/11/21   Janell Quiet, PA-C      Allergies    Bee venom    Review of Systems   Review of Systems  Genitourinary:  Positive for difficulty urinating, dysuria, flank pain, frequency and urgency.  Neurological:  Positive for weakness.  All other systems reviewed and are negative.   Physical Exam Updated Vital Signs BP 126/76 (BP Location: Right Arm)   Pulse 79   Temp 98 F (36.7 C) (Oral)   Resp 20   Ht 6\' 1"  (1.854 m)   Wt 118 kg   SpO2 95%   BMI 34.32 kg/m  Physical Exam Vitals and nursing note reviewed.  Constitutional:      General: He is not in acute distress.    Appearance: He is well-developed.  HENT:  Head: Normocephalic and atraumatic.  Eyes:     Conjunctiva/sclera: Conjunctivae normal.  Cardiovascular:     Rate and Rhythm: Normal rate and regular rhythm.     Heart sounds: No murmur heard. Pulmonary:     Effort: Pulmonary effort is normal. No respiratory distress.     Breath sounds: Normal breath sounds.  Abdominal:     Palpations: Abdomen is soft.     Tenderness: There is no abdominal tenderness. There is left CVA tenderness. There is no right CVA tenderness.  Musculoskeletal:        General: No swelling or signs of injury.     Cervical back: Neck supple.  Skin:    General: Skin is warm and dry.     Capillary Refill: Capillary  refill takes less than 2 seconds.     Findings: No rash.  Neurological:     Mental Status: He is alert.     Comments: MENTAL STATUS EXAM:    Orientation: Alert and oriented to person, place and time.  Memory: Cooperative, follows commands well.  Language: Speech is clear and language is normal.   CRANIAL NERVES:    CN 2 (Optic): Visual fields intact to confrontation.  CN 3,4,6 (EOM): Pupils equal and reactive to light. Full extraocular eye movement without nystagmus.  CN 5 (Trigeminal): Facial sensation is normal, no weakness of masticatory muscles.  CN 7 (Facial): No facial weakness or asymmetry.  CN 8 (Auditory): Auditory acuity grossly normal.  CN 9,10 (Glossophar): The uvula is midline, the palate elevates symmetrically.  CN 11 (spinal access): Normal sternocleidomastoid and trapezius strength.  CN 12 (Hypoglossal): The tongue is midline. No atrophy or fasciculations.Marland Kitchen   MOTOR:  Muscle Strength: 5/5/RUE, 5/5LUE, 4.5/5RLE, 4.5/5LLE.   COORDINATION:   No tremor.   SENSATION:   Intact to light touch all four extremities. On arrival, the patient was afebrile   Psychiatric:        Mood and Affect: Mood normal.     ED Results / Procedures / Treatments   Labs (all labs ordered are listed, but only abnormal results are displayed) Labs Reviewed  URINALYSIS, ROUTINE W REFLEX MICROSCOPIC - Abnormal; Notable for the following components:      Result Value   Glucose, UA >=500 (*)    Hgb urine dipstick TRACE (*)    Leukocytes,Ua SMALL (*)    All other components within normal limits  CBC WITH DIFFERENTIAL/PLATELET - Abnormal; Notable for the following components:   MCV 78.0 (*)    All other components within normal limits  BASIC METABOLIC PANEL - Abnormal; Notable for the following components:   Glucose, Bld 317 (*)    Calcium 8.8 (*)    All other components within normal limits  URINALYSIS, MICROSCOPIC (REFLEX) - Abnormal; Notable for the following components:   Bacteria, UA  FEW (*)    All other components within normal limits  SEDIMENTATION RATE  C-REACTIVE PROTEIN    EKG None  Radiology CT Renal Stone Study  Result Date: 04/27/2022 CLINICAL DATA:  Abdominal flank pain, stone suspected EXAM: CT ABDOMEN AND PELVIS WITHOUT CONTRAST TECHNIQUE: Multidetector CT imaging of the abdomen and pelvis was performed following the standard protocol without IV contrast. RADIATION DOSE REDUCTION: This exam was performed according to the departmental dose-optimization program which includes automated exposure control, adjustment of the mA and/or kV according to patient size and/or use of iterative reconstruction technique. COMPARISON:  05/20/2017 FINDINGS: Lower chest: No acute abnormality. Scarring or atelectasis at the right lung base.  Hepatobiliary: No focal liver abnormality is seen. Status post cholecystectomy. No biliary dilatation. Pancreas: Unremarkable. No pancreatic ductal dilatation or surrounding inflammatory changes. Spleen: Normal in size without significant abnormality. Adrenals/Urinary Tract: Adrenal glands are unremarkable. Kidneys are normal, without renal calculi, solid lesion, or hydronephrosis. Bladder wall thickening (series 6, image 81). Stomach/Bowel: Stomach is within normal limits. Appendix appears normal. No evidence of bowel wall thickening, distention, or inflammatory changes. Vascular/Lymphatic: Aortic atherosclerosis. No enlarged abdominal or pelvic lymph nodes. Reproductive: Prostatomegaly. Other: No abdominal wall hernia or abnormality. No ascites. Musculoskeletal: No acute or significant osseous findings. Disc degenerative disease and bridging osteophytosis lower thoracic and lumbar spine, in keeping with DISH. Unchanged, chronic superior endplate wedge deformity of L1 (series 6, image 85. IMPRESSION: 1. No urinary tract calculi or hydronephrosis. 2. Prostatomegaly with bladder wall thickening, most likely related to chronic outlet obstruction. Correlate  with urinalysis. Aortic Atherosclerosis (ICD10-I70.0). Electronically Signed   By: Jearld Lesch M.D.   On: 04/27/2022 16:09    Procedures Procedures    Medications Ordered in ED Medications  lidocaine (LIDODERM) 5 % 1 patch (has no administration in time range)  cefTRIAXone (ROCEPHIN) 1 g in sodium chloride 0.9 % 100 mL IVPB (has no administration in time range)  lactated ringers bolus 1,000 mL (has no administration in time range)  LORazepam (ATIVAN) tablet 1 mg (has no administration in time range)    ED Course/ Medical Decision Making/ A&P                             Medical Decision Making Amount and/or Complexity of Data Reviewed Labs: ordered. Radiology: ordered.  Risk Prescription drug management.    67 year old male with medical history significant for DM 2, anxiety, depression, GERD, chronic back pain with degenerative disc disease in the lower lumbar spine who presents to the emergency department with multiple complaints.  The patient states that over the last several days he has had burning while urinating, urgency, increased urinary frequency with subsequent development of sharp left-sided flank pain.  He also states that he has been having acute on chronic back pain and has noticed over the last 3 to 4 days bilateral leg heaviness.  He endorses pain down his left leg that only occurs while urinating.  He has had intermittent urinary incontinence that is new.  He states he only has fecal incontinence after he takes MiraLAX which is expected for him.  He has a hx of BPH, denies problems with retention today, able to void, but states that he does experience symptoms of incomplete emptying. No fevers. No abdominal pain, nausea or vomiting.  No recent falls or trauma.   be 126/76, saturating 95% on room air.  Physical exam significant for slight bilateral lower extremity weakness on exam, no decreased sensation, no cranial nerve deficit.  Patient with left-sided CVA tenderness  on exam.  Differential diagnosis includes nephrolithiasis/pyelonephritis/UTI, renal mass, BPH and hydronephrosis, also considered spinal infection, discitis, osteomyelitis, epidural abscess, worsening degenerative disc disease.  Initial laboratory evaluation revealed urinalysis with trace hemoglobin, negative nitrites, small leukocytes, 6-10 WBCs, few bacteria present, CBC without a leukocytosis or anemia, BMP generally unremarkable with hyperglycemia to 317, no AKI, no electrolyte abnormality.  CT renal stone study revealed the following: IMPRESSION:  1. No urinary tract calculi or hydronephrosis.  2. Prostatomegaly with bladder wall thickening, most likely related  to chronic outlet obstruction. Correlate with urinalysis.    Aortic Atherosclerosis (ICD10-I70.0).  Given the patient's prostatomegaly, will have the patient follow-up outpatient with urology, treat for developing pyelonephritis in the setting of symptoms of UTI and left-sided flank pain concerning for developing pyelonephritis.  Initial IV access was obtained and the patient was administered IV Rocephin and IV fluid bolus.  Lidocaine patch was placed in the left flank for pain control.  Given the new onset weakness, urinary symptoms of intermittent episodes of incontinence, recommended that the patient be transferred ER to ER to [facility] for [facility] imaging of the spine to further evaluate.  Dr. Lockie Molauratolo was contacted and accepted the patient in transfer.  The patient was updated regarding the plan of care, currently contacted for transfer and EMTALA was completed.  If negative MRI imaging, patient can be treated outpatient for pyelonephritis, will require outpatient referral to urology which was placed.  Final Clinical Impression(s) / ED Diagnoses Final diagnoses:  Flank pain  Pyelonephritis  Weakness  Urinary incontinence, unspecified type    Rx / DC Orders ED Discharge Orders          Ordered    Ambulatory referral to  Urology        04/27/22 1716    cefpodoxime (VANTIN) 200 MG tablet  2 times daily        04/27/22 1718              Ernie AvenaLawsing, Joylynn Defrancesco, MD 04/27/22 1718

## 2022-04-27 NOTE — ED Triage Notes (Signed)
Pt arrived via [facility] [place]. Pt was seen at [facility] today for flank pain (left) and bilateral lower leg heaviness. Pt received Rocephin at prior to transport to [facility] ED. Ambulatory, alert and oriented. 20g RAC.   PTA EMS Vitals BP 125/95 SPO2 98% RR 14 HR 81

## 2022-04-27 NOTE — ED Triage Notes (Signed)
Pt states left flank pain that started 2 days ago with slight blood noted in urine  States dx with UTI 1 month ago and finished course of antibiotics  States burning with urination, frequency and urgency noted

## 2022-04-27 NOTE — ED Notes (Signed)
Report called to Latanya Presser RN at [facility]

## 2022-04-27 NOTE — Discharge Instructions (Addendum)
Your MRI was reassuring.  Take the antibiotics and follow-up for the urinary infection.

## 2022-04-27 NOTE — ED Notes (Signed)
Pain in L side of back and kidney area

## 2022-04-27 NOTE — ED Notes (Signed)
Patient transported to MRI 

## 2022-04-27 NOTE — ED Provider Notes (Signed)
  Physical Exam  BP (!) 149/99   Pulse 81   Temp 98.2 F (36.8 C) (Oral)   Resp 14   Ht 6\' 1"  (1.854 m)   Wt 118 kg   SpO2 99%   BMI 34.32 kg/m   Physical Exam  Procedures  Procedures  ED Course / MDM    Medical Decision Making Amount and/or Complexity of Data Reviewed Labs: ordered. Radiology: ordered.  Risk Prescription drug management.  Received in transfer for UTI and incontinence.  Has had both fecal and some urine.  Sent in for MRI to rule out cord lesions.  If negative discharge home for pyelonephritis.       Benjiman Core, MD 04/27/22 2248

## 2022-04-27 NOTE — ED Notes (Signed)
Called [facility] for transport talked to [place] at 5:17

## 2022-04-27 NOTE — ED Notes (Signed)
Patient placed on cardiac monitoring.

## 2022-05-03 ENCOUNTER — Telehealth: Payer: Self-pay

## 2022-05-03 NOTE — Telephone Encounter (Signed)
        Patient  visited [place] on 4/10    Telephone encounter attempt :  1st    Incorrect contact information  [facility] Guide, [facility] [phone] [address] [place], [place], [place] [address] Phone: [phone] Email: [email or web address]

## 2022-05-06 IMAGING — CT CT HEAD W/O CM
3 series · 15 of 47 positions shown, 18 images · non-contrast
Comparison: CT brain 07/29/2015

CLINICAL DATA: Dizziness

EXAM:
CT HEAD WITHOUT CONTRAST
TECHNIQUE: Contiguous axial images were obtained from the base of the skull
through the vertex without intravenous contrast.

[Series 2: head wo · axial · 0.48mm/px · z∈[+1190,+1325]mm · 9 of 33 slices shown, 12 images]
[im 3/33  brain]
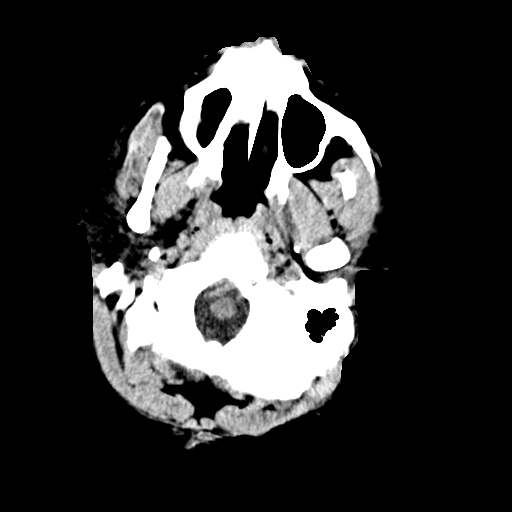
[im 3/33  bone]
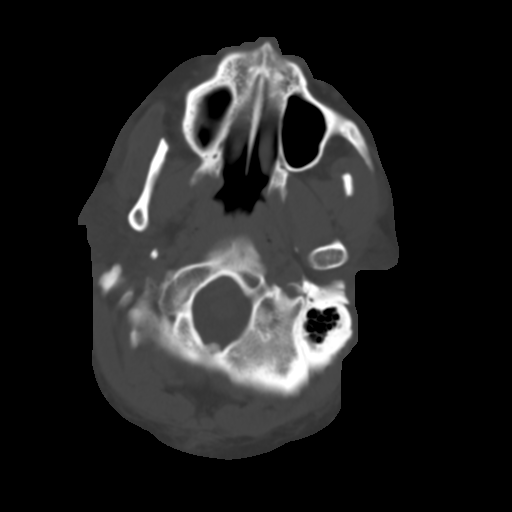
[im 6/33  brain]
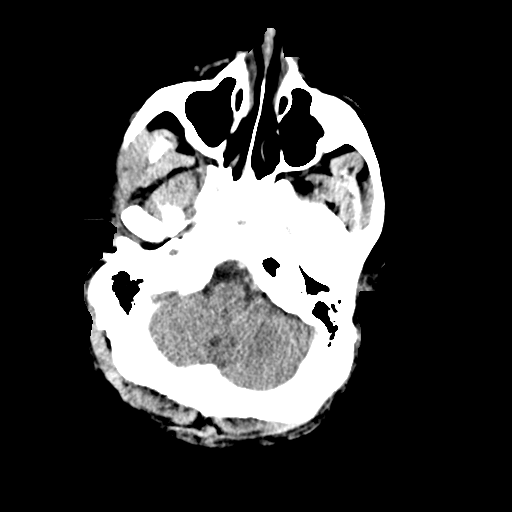
[im 9/33  brain]
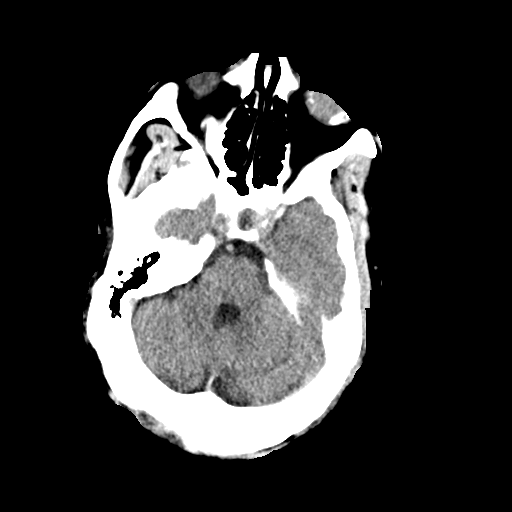
[im 13/33  brain]
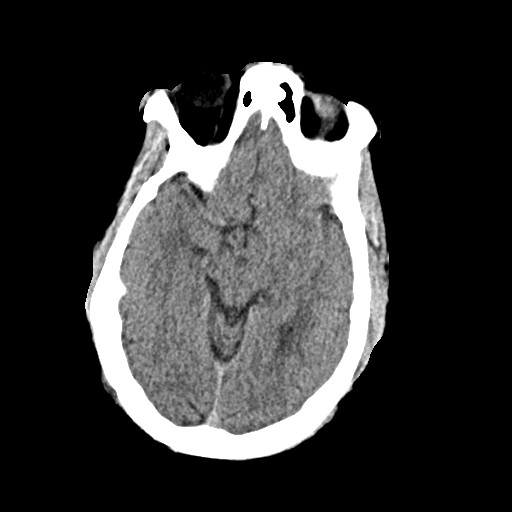
[im 17/33  brain]
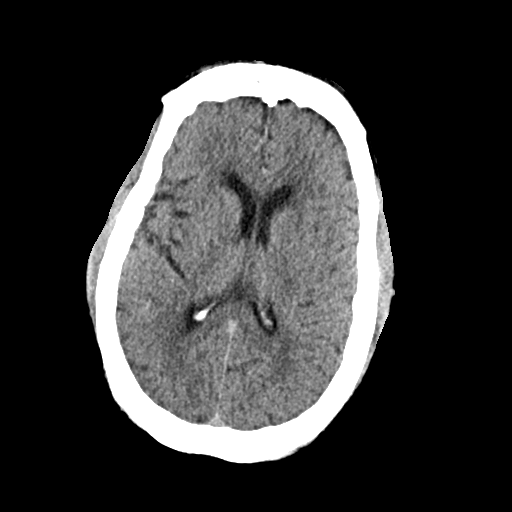
[im 17/33  bone]
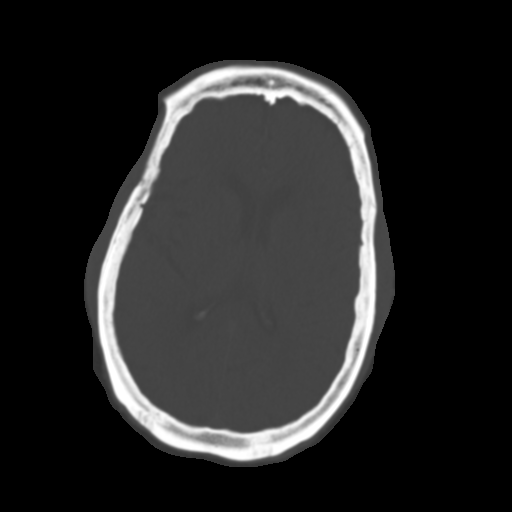
[im 20/33  brain]
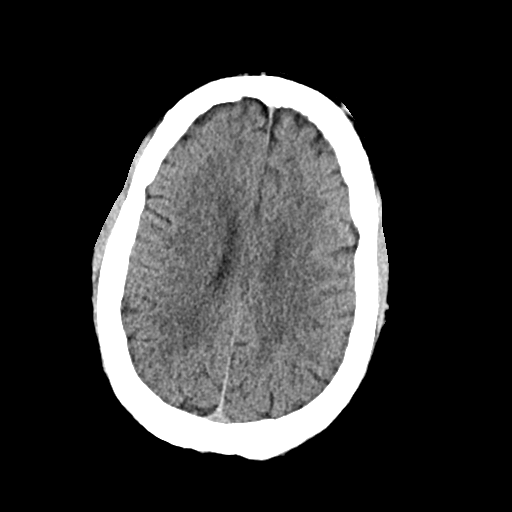
[im 24/33  brain]
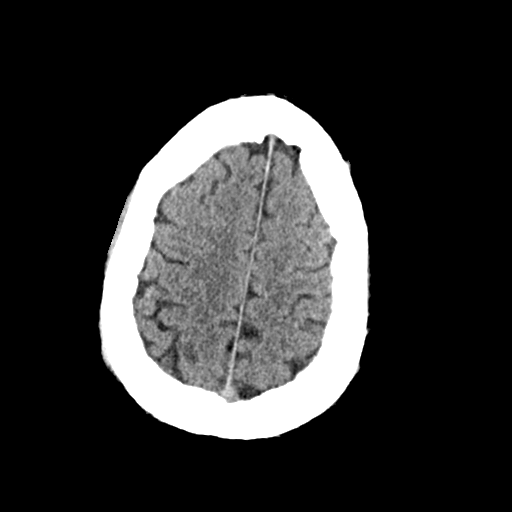
[im 27/33  brain]
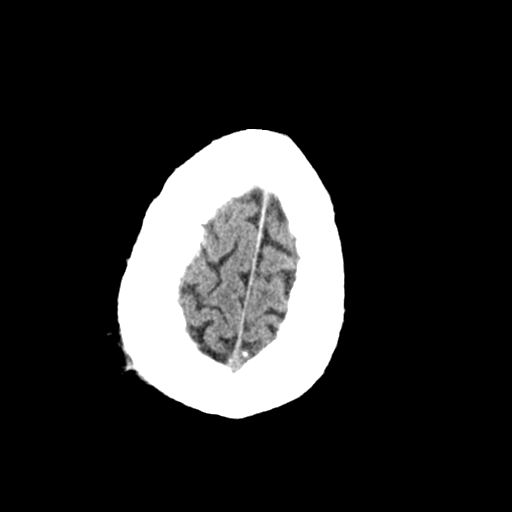
[im 30/33  brain]
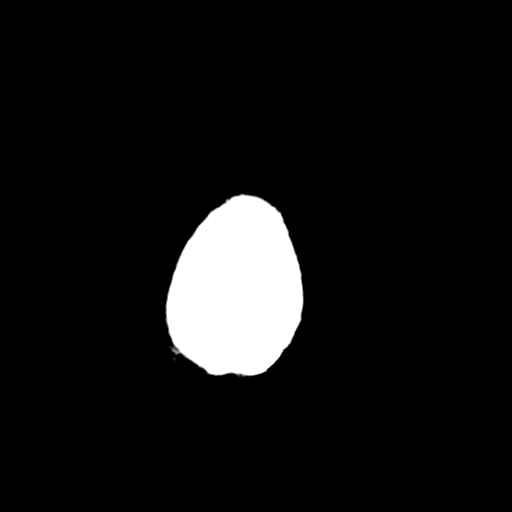
[im 30/33  bone]
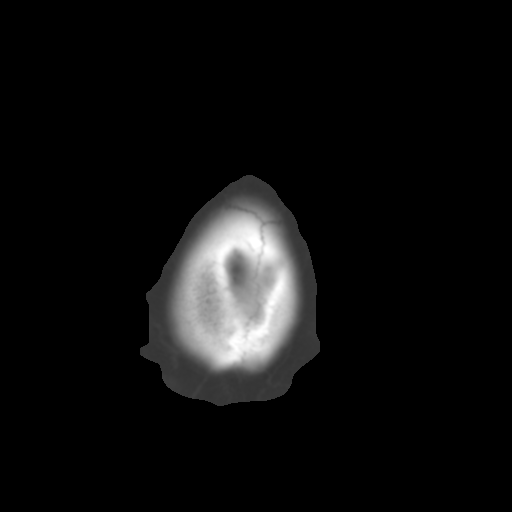

[Series 4: coronal soft · coronal · 0.34mm/px · 3 of 75 slices shown]
[im 25/75  brain]
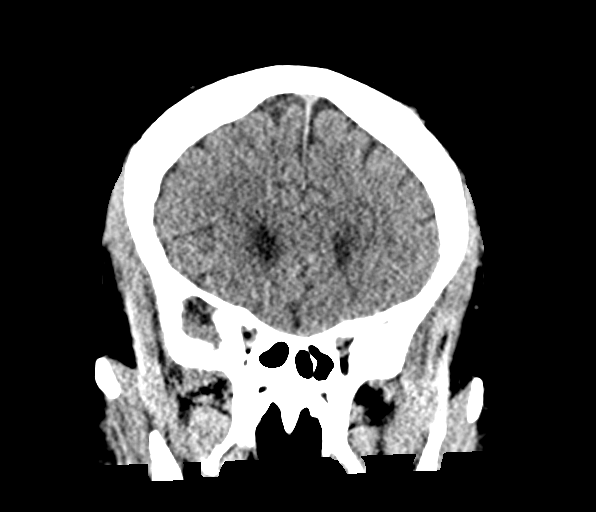
[im 33/75  brain]
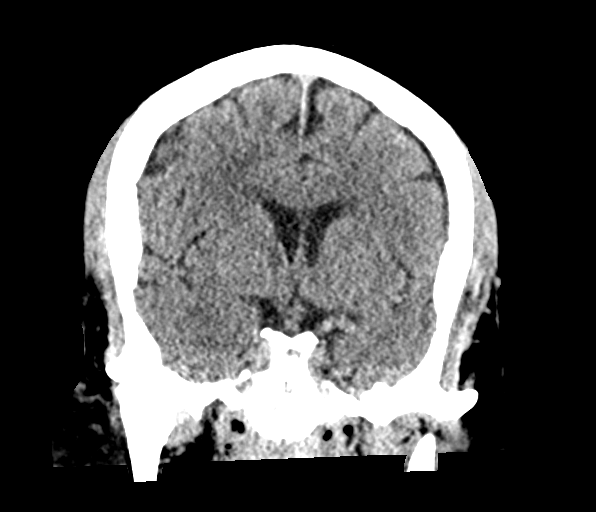
[im 42/75  brain]
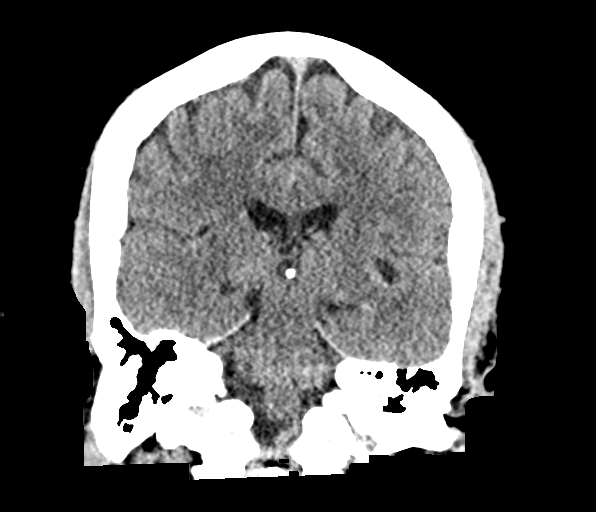

[Series 5: sag soft · sagittal · 0.31mm/px · 3 of 61 slices shown]
[im 21/61  brain]
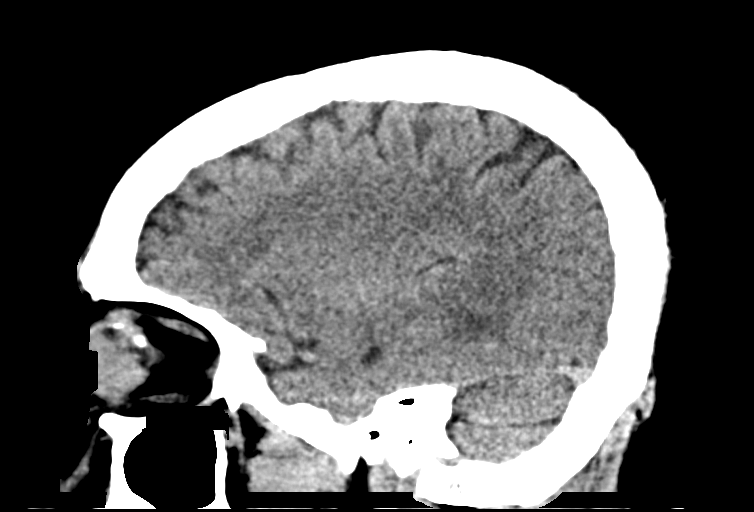
[im 31/61  brain]
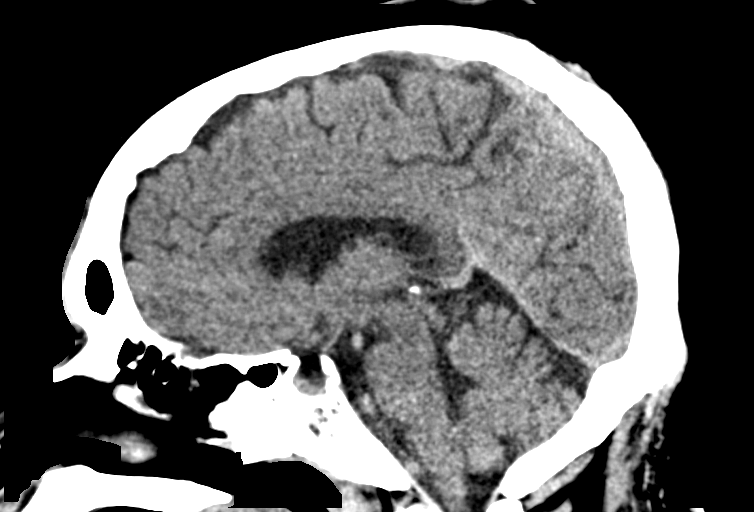
[im 41/61  brain]
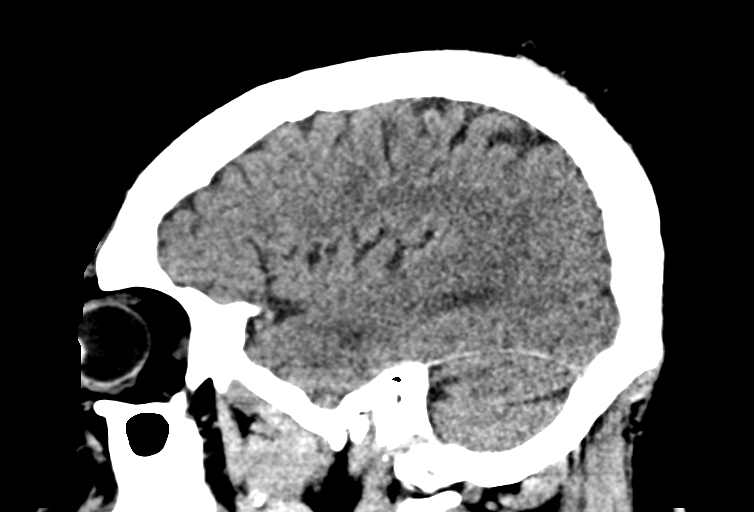

[15 of 47 positions shown; findings below may reference images not displayed]

FINDINGS: Brain: No acute territorial infarction, hemorrhage or intracranial
mass. Mild hypodensity in the white matter consistent with chronic
small vessel ischemic change. Stable ventricle size

Vascular: No hyperdense vessels. Scattered carotid vascular
calcification.

Skull: Normal. Negative for fracture or focal lesion.

Sinuses/Orbits: Hyperdense left globe as before. Atrophic appearing
left optic nerve.

Other: None
IMPRESSION: 1. No CT evidence for acute intracranial abnormality.
2. Mild chronic small vessel ischemic changes of the white matter.

## 2022-05-10 DIAGNOSIS — Z794 Long term (current) use of insulin: Secondary | ICD-10-CM | POA: Diagnosis not present

## 2022-05-10 DIAGNOSIS — E119 Type 2 diabetes mellitus without complications: Secondary | ICD-10-CM | POA: Diagnosis not present

## 2022-05-10 DIAGNOSIS — E1142 Type 2 diabetes mellitus with diabetic polyneuropathy: Secondary | ICD-10-CM | POA: Diagnosis not present

## 2022-05-10 DIAGNOSIS — E782 Mixed hyperlipidemia: Secondary | ICD-10-CM | POA: Diagnosis not present

## 2022-06-02 DIAGNOSIS — Z794 Long term (current) use of insulin: Secondary | ICD-10-CM | POA: Diagnosis not present

## 2022-06-02 DIAGNOSIS — E782 Mixed hyperlipidemia: Secondary | ICD-10-CM | POA: Diagnosis not present

## 2022-06-02 DIAGNOSIS — E119 Type 2 diabetes mellitus without complications: Secondary | ICD-10-CM | POA: Diagnosis not present

## 2022-06-02 DIAGNOSIS — N3000 Acute cystitis without hematuria: Secondary | ICD-10-CM | POA: Diagnosis not present

## 2022-06-02 DIAGNOSIS — E1142 Type 2 diabetes mellitus with diabetic polyneuropathy: Secondary | ICD-10-CM | POA: Diagnosis not present

## 2022-06-21 ENCOUNTER — Encounter: Payer: Self-pay | Admitting: Urology

## 2022-06-21 ENCOUNTER — Ambulatory Visit (INDEPENDENT_AMBULATORY_CARE_PROVIDER_SITE_OTHER): Payer: 59 | Admitting: Urology

## 2022-06-21 VITALS — BP 111/78 | HR 82 | Ht 73.0 in | Wt 265.0 lb

## 2022-06-21 DIAGNOSIS — N401 Enlarged prostate with lower urinary tract symptoms: Secondary | ICD-10-CM

## 2022-06-21 DIAGNOSIS — N138 Other obstructive and reflux uropathy: Secondary | ICD-10-CM | POA: Diagnosis not present

## 2022-06-21 DIAGNOSIS — R300 Dysuria: Secondary | ICD-10-CM

## 2022-06-21 DIAGNOSIS — A5903 Trichomonal cystitis and urethritis: Secondary | ICD-10-CM

## 2022-06-21 LAB — MICROSCOPIC EXAMINATION
Cast Type: NONE SEEN
Casts: NONE SEEN /lpf
Crystal Type: NONE SEEN
Crystals: NONE SEEN
Mucus, UA: NONE SEEN
WBC, UA: >30 /hpf — AB (ref 0–5)
Yeast, UA: NONE SEEN

## 2022-06-21 LAB — URINALYSIS, ROUTINE W REFLEX MICROSCOPIC
Bilirubin, UA: NEGATIVE
Ketones, UA: NEGATIVE
Nitrite, UA: NEGATIVE
Protein,UA: NEGATIVE
Specific Gravity, UA: 1.015 (ref 1.005–1.030)
Urobilinogen, Ur: 0.2 mg/dL (ref 0.2–1.0)
pH, UA: 5.5 (ref 5.0–7.5)

## 2022-06-21 LAB — BLADDER SCAN AMB NON-IMAGING

## 2022-06-21 MED ORDER — ALFUZOSIN HCL ER 10 MG PO TB24
10.0000 mg | ORAL_TABLET | Freq: Every day | ORAL | 11 refills | Status: DC
Start: 2022-06-21 — End: 2022-07-19

## 2022-06-21 MED ORDER — METRONIDAZOLE 500 MG PO TABS
ORAL_TABLET | ORAL | 0 refills | Status: DC
Start: 2022-06-21 — End: 2022-07-19

## 2022-06-21 NOTE — Progress Notes (Signed)
Assessment: 1. Trichomonal urethritis   2. Dysuria   3. BPH with obstruction/lower urinary tract symptoms     Plan: I personally reviewed the patient's chart including provider notes, lab and imaging results. Flagyl 2 gm PO x 1 dose for trichomonas I advised treatment of any sexual partners.  He states that his only partner is currently in [place]. Trial of alfuzosin 10 mg daily.  Rx sent. Return of office in 1 month  Chief Complaint:  Chief Complaint  Patient presents with   Dysuria    History of Present Illness:  Lee Reid is a 67 y.o. male who is seen in consultation from Jackie Plum, MD for evaluation of dysuria.  He had onset of dysuria about 2 months ago.  He was treated with antibiotics with some improvement in symptoms.  His symptoms worsened after finishing antibiotics.  He was seen in the ER on 04/27/22 for evaluation of back pain and leg pain.  Urinalysis showed 6-10 WBC, 6-10 RBC, and few bacteria.  WBC 6.4K.  CT imaging showed normal kidneys, no stones or obstruction, bladder wall thickening, and enlarged prostate.  He was again treated with antibiotics and symptoms improved.  He currently reports mild dysuria, nocturia 2-4x.     Past Medical History:  Past Medical History:  Diagnosis Date   Anginal pain (HCC)    Anxiety    Asthma    Chronic back pain    Depression    Diabetes mellitus without complication (HCC)    Dysrhythmia    irregular heartbeat due to stress   GERD (gastroesophageal reflux disease)    Heart murmur    born with heart murmur   Pneumonia     Past Surgical History:  Past Surgical History:  Procedure Laterality Date   CHOLECYSTECTOMY N/A 09/16/2014   Procedure: LAPAROSCOPIC CHOLECYSTECTOMY ;  Surgeon: De Blanch Kinsinger, MD;  Location: [facility];  Service: General;  Laterality: N/A;   EYE SURGERY     TESTICLE SURGERY     growth removed   TONSILLECTOMY      Allergies:  Allergies  Allergen Reactions   Bee Venom      Family History:  Family History  Problem Relation Age of Onset   Hypertension Mother    Heart disease Father    Cancer Father     Social History:  Social History   Tobacco Use   Smoking status: Every Day    Packs/day: .25    Types: Cigarettes   Smokeless tobacco: Never  Vaping Use   Vaping Use: Former  Substance Use Topics   Alcohol use: Not Currently    Comment: occ   Drug use: No    Review of symptoms:  Constitutional:  Negative for unexplained weight loss, night sweats, fever, chills ENT:  Negative for nose bleeds, sinus pain, painful swallowing CV:  Negative for chest pain, shortness of breath, exercise intolerance, palpitations, loss of consciousness Resp:  Negative for cough, wheezing, shortness of breath GI:  Negative for nausea, vomiting, diarrhea, bloody stools GU:  Positives noted in HPI; otherwise negative for gross hematuria, urinary incontinence Neuro:  Negative for seizures, poor balance, limb weakness, slurred speech Psych:  Negative for lack of energy, depression, anxiety Endocrine:  Negative for polydipsia, polyuria, symptoms of hypoglycemia (dizziness, hunger, sweating) Hematologic:  Negative for anemia, purpura, petechia, prolonged or excessive bleeding, use of anticoagulants  Allergic:  Negative for difficulty breathing or choking as a result of exposure to anything; no shellfish allergy; no allergic  response (rash/itch) to materials, foods  Physical exam: BP 111/78   Pulse 82   Ht 6\' 1"  (1.854 m)   Wt 265 lb (120.2 kg)   BMI 34.96 kg/m  GENERAL APPEARANCE:  Well appearing, well developed, well nourished, NAD HEENT: Atraumatic, Normocephalic, oropharynx clear. NECK: Supple without lymphadenopathy or thyromegaly. LUNGS: Clear to auscultation bilaterally. HEART: Regular Rate and Rhythm without murmurs, gallops, or rubs. ABDOMEN: Soft, non-tender, No Masses. EXTREMITIES: Moves all extremities well.  Without clubbing, cyanosis, or  edema. NEUROLOGIC:  Alert and oriented x 3, normal gait, CN II-XII grossly intact.  MENTAL STATUS:  Appropriate. BACK:  Non-tender to palpation.  No CVAT SKIN:  Warm, dry and intact.   GU: Penis:  uncircumcised Meatus: Normal Scrotum: normal, no masses Testis: normal without masses bilateral Epididymis: normal Prostate: 40 g, NT, no nodules Rectum: Normal tone,  no masses or tenderness   Results: U/A:  >30 WBC, 3-10 RBC, mod bacteria, + trichomonas  PVR = 0 ml

## 2022-06-30 DIAGNOSIS — E782 Mixed hyperlipidemia: Secondary | ICD-10-CM | POA: Diagnosis not present

## 2022-06-30 DIAGNOSIS — R202 Paresthesia of skin: Secondary | ICD-10-CM | POA: Diagnosis not present

## 2022-06-30 DIAGNOSIS — E1142 Type 2 diabetes mellitus with diabetic polyneuropathy: Secondary | ICD-10-CM | POA: Diagnosis not present

## 2022-06-30 DIAGNOSIS — Z794 Long term (current) use of insulin: Secondary | ICD-10-CM | POA: Diagnosis not present

## 2022-06-30 DIAGNOSIS — E119 Type 2 diabetes mellitus without complications: Secondary | ICD-10-CM | POA: Diagnosis not present

## 2022-07-19 ENCOUNTER — Encounter: Payer: Self-pay | Admitting: Urology

## 2022-07-19 ENCOUNTER — Ambulatory Visit (INDEPENDENT_AMBULATORY_CARE_PROVIDER_SITE_OTHER): Payer: 59 | Admitting: Urology

## 2022-07-19 VITALS — BP 114/74 | HR 80 | Ht 73.0 in | Wt 260.0 lb

## 2022-07-19 DIAGNOSIS — N138 Other obstructive and reflux uropathy: Secondary | ICD-10-CM | POA: Diagnosis not present

## 2022-07-19 DIAGNOSIS — R300 Dysuria: Secondary | ICD-10-CM | POA: Insufficient documentation

## 2022-07-19 DIAGNOSIS — N529 Male erectile dysfunction, unspecified: Secondary | ICD-10-CM

## 2022-07-19 DIAGNOSIS — N401 Enlarged prostate with lower urinary tract symptoms: Secondary | ICD-10-CM

## 2022-07-19 MED ORDER — TADALAFIL 20 MG PO TABS
20.0000 mg | ORAL_TABLET | Freq: Every day | ORAL | 11 refills | Status: DC | PRN
Start: 2022-07-19 — End: 2023-05-25

## 2022-07-19 MED ORDER — ALFUZOSIN HCL ER 10 MG PO TB24: 10.0000 mg | ORAL_TABLET | Freq: Every day | ORAL | 11 refills | Status: DC

## 2022-07-19 NOTE — Progress Notes (Signed)
Assessment: 1. BPH with obstruction/lower urinary tract symptoms   2. Organic impotence     Plan: Trial of alfuzosin 10 mg daily.  Rx sent. Rx for tadalafil 20 mg prn provided Return to office in 2 months  Chief Complaint:  Chief Complaint  Patient presents with   Dysuria    History of Present Illness:  Lee Reid is a 67 y.o. male who is seen for continued evaluation of dysuria.  At his initial visit in June 2024, he reported onset of dysuria about 2 months prior.  He was treated with antibiotics with some improvement in symptoms.  His symptoms worsened after finishing antibiotics.  He was seen in the ER on 04/27/22 for evaluation of back pain and leg pain.  Urinalysis showed 6-10 WBC, 6-10 RBC, and few bacteria.  WBC 6.4K.  CT imaging showed normal kidneys, no stones or obstruction, bladder wall thickening, and enlarged prostate.  He was again treated with antibiotics and symptoms improved.  At the time of his visit in June 2024, he reported mild dysuria, nocturia 2-4x.   His urinalysis showed evidence of trichomonas.  He was treated with Flagyl 2 g x 1. He was also given a trial of alfuzosin 10 mg daily for his BPH with lower urinary tract symptoms.  He returns today for follow-up.  He reports that the dysuria has completely resolved.  He never started taking the alfuzosin.  He continues to have lower urinary tract symptoms including sensation of incomplete emptying, intermittent stream, decreased stream, frequency and urgency.  He also has nocturia x 4. IPSS = 31 today. He also reports difficulty achieving and maintaining his erections.  He is currently using sildenafil 100 mg as needed.  He does not feel like this is working as well for him as it once did.  He would like to try tadalafil.  Portions of the above documentation were copied from a prior visit for review purposes only.   Past Medical History:  Past Medical History:  Diagnosis Date   Anginal pain (HCC)     Anxiety    Asthma    Chronic back pain    Depression    Diabetes mellitus without complication (HCC)    Dysrhythmia    irregular heartbeat due to stress   GERD (gastroesophageal reflux disease)    Heart murmur    born with heart murmur   Pneumonia     Past Surgical History:  Past Surgical History:  Procedure Laterality Date   CHOLECYSTECTOMY N/A 09/16/2014   Procedure: LAPAROSCOPIC CHOLECYSTECTOMY ;  Surgeon: De Blanch Kinsinger, MD;  Location: [facility];  Service: General;  Laterality: N/A;   EYE SURGERY     TESTICLE SURGERY     growth removed   TONSILLECTOMY      Allergies:  Allergies  Allergen Reactions   Bee Venom     Family History:  Family History  Problem Relation Age of Onset   Hypertension Mother    Heart disease Father    Cancer Father     Social History:  Social History   Tobacco Use   Smoking status: Every Day    Packs/day: .25    Types: Cigarettes   Smokeless tobacco: Never  Vaping Use   Vaping Use: Former  Substance Use Topics   Alcohol use: Not Currently    Comment: occ   Drug use: No    ROS: Constitutional:  Negative for fever, chills, weight loss CV: Negative for chest pain, previous MI, hypertension  Respiratory:  Negative for shortness of breath, wheezing, sleep apnea, frequent cough GI:  Negative for nausea, vomiting, bloody stool, GERD  Physical exam: BP 114/74   Pulse 80   Ht 6\' 1"  (1.854 m)   Wt 260 lb (117.9 kg)   BMI 34.30 kg/m  GENERAL APPEARANCE:  Well appearing, well developed, well nourished, NAD HEENT:  Atraumatic, normocephalic, oropharynx clear NECK:  Supple without lymphadenopathy or thyromegaly ABDOMEN:  Soft, non-tender, no masses EXTREMITIES:  Moves all extremities well, without clubbing, cyanosis, or edema NEUROLOGIC:  Alert and oriented x 3, normal gait, CN II-XII grossly intact MENTAL STATUS:  appropriate BACK:  Non-tender to palpation, No CVAT SKIN:  Warm, dry, and intact   Results: U/A: negative

## 2022-07-20 LAB — URINALYSIS, ROUTINE W REFLEX MICROSCOPIC
Bilirubin, UA: NEGATIVE
Glucose, UA: NEGATIVE
Ketones, UA: NEGATIVE
Leukocytes,UA: NEGATIVE
Nitrite, UA: NEGATIVE
Protein,UA: NEGATIVE
RBC, UA: NEGATIVE
Specific Gravity, UA: 1.03 (ref 1.005–1.030)
Urobilinogen, Ur: 0.2 mg/dL (ref 0.2–1.0)
pH, UA: 6 (ref 5.0–7.5)

## 2022-08-09 DIAGNOSIS — E782 Mixed hyperlipidemia: Secondary | ICD-10-CM | POA: Diagnosis not present

## 2022-08-09 DIAGNOSIS — E1142 Type 2 diabetes mellitus with diabetic polyneuropathy: Secondary | ICD-10-CM | POA: Diagnosis not present

## 2022-08-09 DIAGNOSIS — E538 Deficiency of other specified B group vitamins: Secondary | ICD-10-CM | POA: Diagnosis not present

## 2022-08-09 DIAGNOSIS — E119 Type 2 diabetes mellitus without complications: Secondary | ICD-10-CM | POA: Diagnosis not present

## 2022-08-09 DIAGNOSIS — Z794 Long term (current) use of insulin: Secondary | ICD-10-CM | POA: Diagnosis not present

## 2022-08-23 DIAGNOSIS — R29898 Other symptoms and signs involving the musculoskeletal system: Secondary | ICD-10-CM | POA: Diagnosis not present

## 2022-08-23 DIAGNOSIS — R202 Paresthesia of skin: Secondary | ICD-10-CM | POA: Diagnosis not present

## 2022-09-09 DIAGNOSIS — R202 Paresthesia of skin: Secondary | ICD-10-CM | POA: Diagnosis not present

## 2022-09-09 DIAGNOSIS — R29898 Other symptoms and signs involving the musculoskeletal system: Secondary | ICD-10-CM | POA: Diagnosis not present

## 2022-09-27 ENCOUNTER — Encounter: Payer: Self-pay | Admitting: Urology

## 2022-09-27 ENCOUNTER — Ambulatory Visit (INDEPENDENT_AMBULATORY_CARE_PROVIDER_SITE_OTHER): Payer: 59 | Admitting: Urology

## 2022-09-27 VITALS — BP 128/81 | HR 81 | Ht 73.0 in | Wt 270.0 lb

## 2022-09-27 DIAGNOSIS — R300 Dysuria: Secondary | ICD-10-CM

## 2022-09-27 DIAGNOSIS — N138 Other obstructive and reflux uropathy: Secondary | ICD-10-CM | POA: Diagnosis not present

## 2022-09-27 DIAGNOSIS — N401 Enlarged prostate with lower urinary tract symptoms: Secondary | ICD-10-CM

## 2022-09-27 DIAGNOSIS — N529 Male erectile dysfunction, unspecified: Secondary | ICD-10-CM | POA: Diagnosis not present

## 2022-09-27 LAB — URINALYSIS, ROUTINE W REFLEX MICROSCOPIC
Bilirubin, UA: NEGATIVE
Ketones, UA: NEGATIVE
Leukocytes,UA: NEGATIVE
Nitrite, UA: NEGATIVE
Protein,UA: NEGATIVE
Specific Gravity, UA: >1.03 — ABNORMAL HIGH (ref 1.005–1.030)
Urobilinogen, Ur: 0.2 mg/dL (ref 0.2–1.0)
pH, UA: 5.5 (ref 5.0–7.5)

## 2022-09-27 LAB — MICROSCOPIC EXAMINATION

## 2022-09-27 NOTE — Progress Notes (Signed)
Assessment: 1. BPH with obstruction/lower urinary tract symptoms   2. Organic impotence     Plan: Continue tadalafil 20 mg prn  Urine sent for STD testing per patient request.  Patient left the office prior to completion of his visit due to a family emergency. Recommend evaluation with PSA next visit PVR next visit for evaluation of urinary symptoms.   Chief Complaint:  Chief Complaint  Patient presents with   Benign Prostatic Hypertrophy    History of Present Illness:  Lee Reid is a 67 y.o. male who is seen for continued evaluation of BPH with LUTS and ED.  At his initial visit in June 2024, he reported onset of dysuria about 2 months prior.  He was treated with antibiotics with some improvement in symptoms.  His symptoms worsened after finishing antibiotics.  He was seen in the ER on 04/27/22 for evaluation of back pain and leg pain.  Urinalysis showed 6-10 WBC, 6-10 RBC, and few bacteria.  WBC 6.4K.  CT imaging showed normal kidneys, no stones or obstruction, bladder wall thickening, and enlarged prostate.  He was again treated with antibiotics and symptoms improved.  At the time of his visit in June 2024, he reported mild dysuria, nocturia 2-4x.   His urinalysis showed evidence of trichomonas.  He was treated with Flagyl 2 g x 1. He was also given a trial of alfuzosin 10 mg daily for his BPH with lower urinary tract symptoms.  At his visit in July 2024, he reported that the dysuria had completely resolved.  He never started taking the alfuzosin.  He continued to have lower urinary tract symptoms including sensation of incomplete emptying, intermittent stream, decreased stream, frequency and urgency.  He also had nocturia x 4. IPSS = 31. He also reported difficulty achieving and maintaining his erections.  He was using sildenafil 100 mg as needed.  He did not feel like this was working as well for him as it once did.  He was given a trial of tadalafil 20 mg as needed. I  recommended that he begin the alfuzosin as previously prescribed.  He was in today for follow-up.  He continues on alfuzosin.  He he reports that he is now having problems with urgency and urge incontinence.  He also reports slight dysuria within the past week.  He has previously used nystatin cream with improvement in his symptoms.  He has not used this recently.  He is also concerned about STDs.  He reports a monogamous relationship with his fiance. He is using tadalafil 20 mg as needed with some improvement in his erections.   Portions of the above documentation were copied from a prior visit for review purposes only.   Past Medical History:  Past Medical History:  Diagnosis Date   Anginal pain (HCC)    Anxiety    Asthma    Chronic back pain    Depression    Diabetes mellitus without complication (HCC)    Dysrhythmia    irregular heartbeat due to stress   GERD (gastroesophageal reflux disease)    Heart murmur    born with heart murmur   Pneumonia     Past Surgical History:  Past Surgical History:  Procedure Laterality Date   CHOLECYSTECTOMY N/A 09/16/2014   Procedure: LAPAROSCOPIC CHOLECYSTECTOMY ;  Surgeon: De Blanch Kinsinger, MD;  Location: [facility];  Service: General;  Laterality: N/A;   EYE SURGERY     TESTICLE SURGERY     growth removed  TONSILLECTOMY      Allergies:  Allergies  Allergen Reactions   Bee Venom     Family History:  Family History  Problem Relation Age of Onset   Hypertension Mother    Heart disease Father    Cancer Father     Social History:  Social History   Tobacco Use   Smoking status: Every Day    Current packs/day: 0.25    Types: Cigarettes   Smokeless tobacco: Never  Vaping Use   Vaping status: Former  Substance Use Topics   Alcohol use: Not Currently    Comment: occ   Drug use: No    ROS: Constitutional:  Negative for fever, chills, weight loss CV: Negative for chest pain, previous MI, hypertension Respiratory:   Negative for shortness of breath, wheezing, sleep apnea, frequent cough GI:  Negative for nausea, vomiting, bloody stool, GERD  Physical exam: BP 128/81   Pulse 81   Ht 6\' 1"  (1.854 m)   Wt 270 lb (122.5 kg)   BMI 35.62 kg/m  GENERAL APPEARANCE:  Well appearing, well developed, well nourished, NAD HEENT:  Atraumatic, normocephalic, oropharynx clear NECK:  Supple without lymphadenopathy or thyromegaly ABDOMEN:  Soft, non-tender, no masses EXTREMITIES:  Moves all extremities well, without clubbing, cyanosis, or edema NEUROLOGIC:  Alert and oriented x 3, normal gait, CN II-XII grossly intact MENTAL STATUS:  appropriate BACK:  Non-tender to palpation, No CVAT SKIN:  Warm, dry, and intact GU: Penis:  uncircumcised; some erythema of foreskin and glans Meatus: Normal Scrotum: normal, no masses Testis: normal without masses bilateral   Results: U/A: 6-10 WBC, 0-2 RBC

## 2022-09-30 DIAGNOSIS — G629 Polyneuropathy, unspecified: Secondary | ICD-10-CM | POA: Diagnosis not present

## 2022-09-30 DIAGNOSIS — G5622 Lesion of ulnar nerve, left upper limb: Secondary | ICD-10-CM | POA: Diagnosis not present

## 2022-10-07 LAB — CT, NG, MYCOPLASMAS NAA, URINE
Chlamydia trachomatis, NAA: NEGATIVE
Mycoplasma genitalium NAA: NEGATIVE
Mycoplasma hominis NAA: NEGATIVE
Neisseria gonorrhoeae, NAA: NEGATIVE
Ureaplasma spp NAA: POSITIVE — AB

## 2022-10-07 MED ORDER — DOXYCYCLINE HYCLATE 100 MG PO CAPS
100.0000 mg | ORAL_CAPSULE | Freq: Two times a day (BID) | ORAL | 0 refills | Status: DC
Start: 2022-10-07 — End: 2022-10-11

## 2022-10-07 NOTE — Addendum Note (Signed)
Addended by: Milderd Meager on: 10/07/2022 09:58 AM   Modules accepted: Orders

## 2022-10-11 ENCOUNTER — Telehealth: Payer: Self-pay | Admitting: Urology

## 2022-10-11 DIAGNOSIS — R300 Dysuria: Secondary | ICD-10-CM

## 2022-10-11 MED ORDER — DOXYCYCLINE HYCLATE 100 MG PO CAPS
100.0000 mg | ORAL_CAPSULE | Freq: Two times a day (BID) | ORAL | 0 refills | Status: DC
Start: 2022-10-11 — End: 2022-11-04

## 2022-10-11 NOTE — Telephone Encounter (Signed)
Patient is still waiting for medication to be sent to his pharmacy. Spoke with Dr Wynema Birch was told he had an infection and wants to know if that medication could be sent to the pharmacy ([facility]).

## 2022-10-11 NOTE — Telephone Encounter (Signed)
Patient states the pharmacy meds were sent to originally is too far. He would like them sent to the [facility] on [address]. Abx Rx resent.

## 2022-11-04 ENCOUNTER — Encounter: Payer: Self-pay | Admitting: Urology

## 2022-11-04 ENCOUNTER — Ambulatory Visit: Payer: 59 | Admitting: Urology

## 2022-11-04 VITALS — BP 115/77 | HR 87 | Ht 73.0 in | Wt 260.0 lb

## 2022-11-04 DIAGNOSIS — N138 Other obstructive and reflux uropathy: Secondary | ICD-10-CM

## 2022-11-04 DIAGNOSIS — R300 Dysuria: Secondary | ICD-10-CM

## 2022-11-04 DIAGNOSIS — N529 Male erectile dysfunction, unspecified: Secondary | ICD-10-CM

## 2022-11-04 DIAGNOSIS — N401 Enlarged prostate with lower urinary tract symptoms: Secondary | ICD-10-CM

## 2022-11-04 LAB — BLADDER SCAN AMB NON-IMAGING

## 2022-11-04 MED ORDER — SILODOSIN 8 MG PO CAPS
8.0000 mg | ORAL_CAPSULE | Freq: Every day | ORAL | 11 refills | Status: DC
Start: 2022-11-04 — End: 2023-05-23

## 2022-11-04 MED ORDER — SILODOSIN 8 MG PO CAPS
8.0000 mg | ORAL_CAPSULE | Freq: Every day | ORAL | 11 refills | Status: DC
Start: 2022-11-04 — End: 2022-11-04

## 2022-11-04 NOTE — Progress Notes (Signed)
Assessment: 1. BPH with obstruction/lower urinary tract symptoms   2. Organic impotence   3. Dysuria     Plan: STD testing per patient request Continue tadalafil 20 mg prn  PSA today Trial of silodosin 8 mg daily in place of alfuzosin Return to office in 1 month with PVR I discussed further evaluation with cystoscopy.  He is reluctant to proceed at this time.  Chief Complaint:  Chief Complaint  Patient presents with   Benign Prostatic Hypertrophy    History of Present Illness:  Lee Reid is a 67 y.o. male who is seen for continued evaluation of BPH with LUTS and ED.  At his initial visit in June 2024, he reported onset of dysuria about 2 months prior.  He was treated with antibiotics with some improvement in symptoms.  His symptoms worsened after finishing antibiotics.  He was seen in the ER on 04/27/22 for evaluation of back pain and leg pain.  Urinalysis showed 6-10 WBC, 6-10 RBC, and few bacteria.  WBC 6.4K.  CT imaging showed normal kidneys, no stones or obstruction, bladder wall thickening, and enlarged prostate.  He was again treated with antibiotics and symptoms improved.  At the time of his visit in June 2024, he reported mild dysuria, nocturia 2-4x.   His urinalysis showed evidence of trichomonas.  He was treated with Flagyl 2 g x 1. He was also given a trial of alfuzosin 10 mg daily for his BPH with lower urinary tract symptoms. PVR = 0 ml in 6/24  At his visit in July 2024, he reported that the dysuria had completely resolved.  He never started taking the alfuzosin.  He continued to have lower urinary tract symptoms including sensation of incomplete emptying, intermittent stream, decreased stream, frequency and urgency.  He also had nocturia x 4. IPSS = 31. He also reported difficulty achieving and maintaining his erections.  He was using sildenafil 100 mg as needed.  He did not feel like this was working as well for him as it once did.  He was given a trial of  tadalafil 20 mg as needed.  At his visit in 9/24, he continued on alfuzosin.  He reported problems with urgency and urge incontinence.  He also reported slight dysuria.   He was concerned about STDs.  He reported a monogamous relationship with his fiance. He was using tadalafil 20 mg as needed with some improvement in his erections. 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  He was treated with doxycycline x 7 days.  He returns today for follow-up.  He continues on alfuzosin.  He continues to feel like he does not empty his bladder completely.  He is also having some slight dysuria.  He remains concerned about the possibility of STDs.  No gross hematuria or flank pain.  No urethral discharge. He is using tadalafil 20 mg as needed for erectile dysfunction.  Portions of the above documentation were copied from a prior visit for review purposes only.   Past Medical History:  Past Medical History:  Diagnosis Date   Anginal pain (HCC)    Anxiety    Asthma    Chronic back pain    Depression    Diabetes mellitus without complication (HCC)    Dysrhythmia    irregular heartbeat due to stress   GERD (gastroesophageal reflux disease)    Heart murmur    born with heart murmur   Pneumonia     Past Surgical History:  Past Surgical History:  Procedure Laterality Date   CHOLECYSTECTOMY N/A 09/16/2014   Procedure: LAPAROSCOPIC CHOLECYSTECTOMY ;  Surgeon: De Blanch Kinsinger, MD;  Location: [facility];  Service: General;  Laterality: N/A;   EYE SURGERY     TESTICLE SURGERY     growth removed   TONSILLECTOMY      Allergies:  Allergies  Allergen Reactions   Bee Venom     Family History:  Family History  Problem Relation Age of Onset   Hypertension Mother    Heart disease Father    Cancer Father     Social History:  Social History   Tobacco Use   Smoking status: Every Day    Current packs/day: 0.25    Types: Cigarettes   Smokeless tobacco: Never  Vaping Use   Vaping status:  Former  Substance Use Topics   Alcohol use: Not Currently    Comment: occ   Drug use: No    ROS: Constitutional:  Negative for fever, chills, weight loss CV: Negative for chest pain, previous MI, hypertension Respiratory:  Negative for shortness of breath, wheezing, sleep apnea, frequent cough GI:  Negative for nausea, vomiting, bloody stool, GERD  Physical exam: BP 115/77   Pulse 87   Ht 6\' 1"  (1.854 m)   Wt 260 lb (117.9 kg)   BMI 34.30 kg/m  GENERAL APPEARANCE:  Well appearing, well developed, well nourished, NAD HEENT:  Atraumatic, normocephalic, oropharynx clear NECK:  Supple without lymphadenopathy or thyromegaly ABDOMEN:  Soft, non-tender, no masses EXTREMITIES:  Moves all extremities well, without clubbing, cyanosis, or edema NEUROLOGIC:  Alert and oriented x 3, normal gait, CN II-XII grossly intact MENTAL STATUS:  appropriate BACK:  Non-tender to palpation, No CVAT SKIN:  Warm, dry, and intact   Results: U/A: 0 WBC, 0-2 RBC, no bacteria  PVR: 85 ml

## 2022-11-04 NOTE — Addendum Note (Signed)
Addended by: Lizbeth Bark on: 11/04/2022 11:33 AM   Modules accepted: Orders

## 2022-11-05 LAB — PSA: Prostate Specific Ag, Serum: 1.5 ng/mL (ref 0.0–4.0)

## 2022-11-06 LAB — CT, NG, MYCOPLASMAS NAA, URINE
Chlamydia trachomatis, NAA: NEGATIVE
Mycoplasma genitalium NAA: NEGATIVE
Mycoplasma hominis NAA: NEGATIVE
Neisseria gonorrhoeae, NAA: NEGATIVE
Ureaplasma spp NAA: NEGATIVE

## 2022-11-10 LAB — URINALYSIS, ROUTINE W REFLEX MICROSCOPIC
Bilirubin, UA: NEGATIVE
Ketones, UA: NEGATIVE
Leukocytes,UA: NEGATIVE
Nitrite, UA: NEGATIVE
Protein,UA: NEGATIVE
Specific Gravity, UA: 1.02 (ref 1.005–1.030)
Urobilinogen, Ur: 0.2 mg/dL (ref 0.2–1.0)
pH, UA: 6 (ref 5.0–7.5)

## 2022-11-10 LAB — MICROSCOPIC EXAMINATION

## 2022-11-22 ENCOUNTER — Emergency Department (HOSPITAL_BASED_OUTPATIENT_CLINIC_OR_DEPARTMENT_OTHER)
Admission: EM | Admit: 2022-11-22 | Discharge: 2022-11-22 | Disposition: A | Discharge status: Home / Self Care | Payer: 59 | Attending: Emergency Medicine | Admitting: Emergency Medicine

## 2022-11-22 ENCOUNTER — Encounter (HOSPITAL_BASED_OUTPATIENT_CLINIC_OR_DEPARTMENT_OTHER): Payer: Self-pay | Admitting: Emergency Medicine

## 2022-11-22 ENCOUNTER — Other Ambulatory Visit: Payer: Self-pay

## 2022-11-22 DIAGNOSIS — Z7984 Long term (current) use of oral hypoglycemic drugs: Secondary | ICD-10-CM | POA: Diagnosis not present

## 2022-11-22 DIAGNOSIS — M79672 Pain in left foot: Secondary | ICD-10-CM | POA: Diagnosis not present

## 2022-11-22 DIAGNOSIS — T8140XA Infection following a procedure, unspecified, initial encounter: Secondary | ICD-10-CM | POA: Diagnosis not present

## 2022-11-22 DIAGNOSIS — J45909 Unspecified asthma, uncomplicated: Secondary | ICD-10-CM | POA: Insufficient documentation

## 2022-11-22 DIAGNOSIS — Z79899 Other long term (current) drug therapy: Secondary | ICD-10-CM | POA: Diagnosis not present

## 2022-11-22 DIAGNOSIS — E119 Type 2 diabetes mellitus without complications: Secondary | ICD-10-CM | POA: Insufficient documentation

## 2022-11-22 DIAGNOSIS — R42 Dizziness and giddiness: Secondary | ICD-10-CM | POA: Diagnosis not present

## 2022-11-22 LAB — CBC WITH DIFFERENTIAL/PLATELET
Abs Immature Granulocytes: 0.02 10*3/uL (ref 0.00–0.07)
Basophils Absolute: 0 10*3/uL (ref 0.0–0.1)
Basophils Relative: 0 %
Eosinophils Absolute: 0.1 10*3/uL (ref 0.0–0.5)
Eosinophils Relative: 2 %
HCT: 44.9 % (ref 39.0–52.0)
Hemoglobin: 15.1 g/dL (ref 13.0–17.0)
Immature Granulocytes: 0 %
Lymphocytes Relative: 36 %
Lymphs Abs: 2 10*3/uL (ref 0.7–4.0)
MCH: 25.9 pg — ABNORMAL LOW (ref 26.0–34.0)
MCHC: 33.6 g/dL (ref 30.0–36.0)
MCV: 77 fL — ABNORMAL LOW (ref 80.0–100.0)
Monocytes Absolute: 0.4 10*3/uL (ref 0.1–1.0)
Monocytes Relative: 7 %
Neutro Abs: 2.9 10*3/uL (ref 1.7–7.7)
Neutrophils Relative %: 55 %
Platelets: 166 10*3/uL (ref 150–400)
RBC: 5.83 MIL/uL — ABNORMAL HIGH (ref 4.22–5.81)
RDW: 14.4 % (ref 11.5–15.5)
WBC: 5.5 10*3/uL (ref 4.0–10.5)
nRBC: 0 % (ref 0.0–0.2)

## 2022-11-22 LAB — COMPREHENSIVE METABOLIC PANEL
ALT: 22 U/L (ref 0–44)
AST: 20 U/L (ref 15–41)
Albumin: 3.9 g/dL (ref 3.5–5.0)
Alkaline Phosphatase: 75 U/L (ref 38–126)
Anion gap: 9 (ref 5–15)
BUN: 16 mg/dL (ref 8–23)
CO2: 24 mmol/L (ref 22–32)
Calcium: 9.7 mg/dL (ref 8.9–10.3)
Chloride: 105 mmol/L (ref 98–111)
Creatinine, Ser: 1.13 mg/dL (ref 0.61–1.24)
GFR, Estimated: >60 mL/min (ref >60)
Glucose, Bld: 278 mg/dL — ABNORMAL HIGH (ref 70–99)
Potassium: 3.9 mmol/L (ref 3.5–5.1)
Sodium: 138 mmol/L (ref 135–145)
Total Bilirubin: 0.6 mg/dL (ref <1.2)
Total Protein: 6.8 g/dL (ref 6.5–8.1)

## 2022-11-22 LAB — URINALYSIS, W/ REFLEX TO CULTURE (INFECTION SUSPECTED)
Bilirubin Urine: NEGATIVE
Glucose, UA: >=500 mg/dL — AB
Ketones, ur: NEGATIVE mg/dL
Leukocytes,Ua: NEGATIVE
Nitrite: NEGATIVE
Protein, ur: NEGATIVE mg/dL
Specific Gravity, Urine: 1.015 (ref 1.005–1.030)
WBC, UA: NONE SEEN WBC/hpf (ref 0–5)
pH: 5.5 (ref 5.0–8.0)

## 2022-11-22 LAB — LACTIC ACID, PLASMA: Lactic Acid, Venous: 2.5 mmol/L (ref 0.5–1.9)

## 2022-11-22 MED ORDER — OXYCODONE HCL 5 MG PO TABS: 5.0000 mg | ORAL_TABLET | ORAL | 0 refills | Status: DC | PRN

## 2022-11-22 NOTE — ED Triage Notes (Signed)
Left foot pain x 2 weeks , possible wound infection per pt , Hx diabetes , reports controlled CBG .  Reports have not noticed any drainage but pain is 10/10

## 2022-11-22 NOTE — ED Provider Notes (Signed)
[place] [facility] AT [facility] Provider Note   CSN: [id number] Arrival date & time: 11/22/22  1536     History {Add pertinent medical, surgical, social history, OB history to HPI:1} Chief Complaint  Patient presents with   Wound Infection    Lee Reid is a 67 y.o. male.  HPI      Friday became more severe but has been last 3-4 weeks off and on, but constant regular pain since Friday and can't take it anymore.  Taking a lot of aleve, has another 1.5 months before see podiatrist, nothing helping, trying gabapentin but it makes him feel dizzy. Here because of pain, tolerates pain but this is getting worse.  Sharp pain in whole left foot at times shooting to big toe, pain from bunion on the left side and callus underneath. Pain worse in these locations and then connect. If lean on the callus the bunion starts hurting and back and forth. 10/10 pain.  Gets complete relief when his callus is cut off. Sees podiatrist every 6 months and can't see them any sooner. Saw PCP today.   No fever, nausea, vomiting No drainage.   Home Medications Prior to Admission medications   Medication Sig Start Date End Date Taking? Authorizing Provider  Empagliflozin (JARDIANCE PO) Take by mouth.    [provider]  gabapentin (NEURONTIN) 300 MG capsule Take 1 capsule (300 mg total) by mouth 3 (three) times daily. 03/27/22 04/26/22  Lonell Grandchild, MD  glipiZIDE (GLUCOTROL XL) 5 MG 24 hr tablet Take 5 mg by mouth daily. 06/20/22   [provider]  LANTUS 100 UNIT/ML injection Inject 10 Units into the skin at bedtime. 06/28/22   [provider]  MOUNJARO 2.5 MG/0.5ML Pen Inject into the skin. 06/02/22   [provider]  silodosin (RAPAFLO) 8 MG CAPS capsule Take 1 capsule (8 mg total) by mouth daily with breakfast. 11/04/22   Stoneking, Danford Bad., MD  tadalafil (CIALIS) 20 MG tablet Take 1 tablet (20 mg total) by mouth daily as needed for  erectile dysfunction. 07/19/22   Stoneking, Danford Bad., MD      Allergies    Bee venom    Review of Systems   Review of Systems  Physical Exam Updated Vital Signs BP 133/66 (BP Location: Right Arm)   Pulse 78   Temp 97.8 F (36.6 C)   Resp 20   Wt 117.9 kg   SpO2 95%   BMI 34.30 kg/m  Physical Exam  ED Results / Procedures / Treatments   Labs (all labs ordered are listed, but only abnormal results are displayed) Labs Reviewed  LACTIC ACID, PLASMA - Abnormal; Notable for the following components:      Result Value   Lactic Acid, Venous 2.5 (*)    All other components within normal limits  COMPREHENSIVE METABOLIC PANEL - Abnormal; Notable for the following components:   Glucose, Bld 278 (*)    All other components within normal limits  CBC WITH DIFFERENTIAL/PLATELET - Abnormal; Notable for the following components:   RBC 5.83 (*)    MCV 77.0 (*)    MCH 25.9 (*)    All other components within normal limits  URINALYSIS, W/ REFLEX TO CULTURE (INFECTION SUSPECTED) - Abnormal; Notable for the following components:   APPearance HAZY (*)    Glucose, UA >=500 (*)    Hgb urine dipstick TRACE (*)    Bacteria, UA RARE (*)    All other components within  normal limits  LACTIC ACID, PLASMA    EKG None  Radiology No results found.  Procedures Procedures  {Document cardiac monitor, telemetry assessment procedure when appropriate:1}  Medications Ordered in ED Medications - No data to display  ED Course/ Medical Decision Making/ A&P   {   Click here for ABCD2, HEART and other calculatorsREFRESH Note before signing :1}                              Medical Decision Making Amount and/or Complexity of Data Reviewed Labs: ordered.   ***  {Document critical care time when appropriate:1} {Document review of labs and clinical decision tools ie heart score, Chads2Vasc2 etc:1}  {Document your independent review of radiology images, and any outside records:1} {Document your  discussion with family members, caretakers, and with consultants:1} {Document social determinants of health affecting pt's care:1} {Document your decision making why or why not admission, treatments were needed:1} Final Clinical Impression(s) / ED Diagnoses Final diagnoses:  None    Rx / DC Orders ED Discharge Orders     None

## 2022-12-01 DIAGNOSIS — I739 Peripheral vascular disease, unspecified: Secondary | ICD-10-CM | POA: Diagnosis not present

## 2022-12-01 DIAGNOSIS — L84 Corns and callosities: Secondary | ICD-10-CM | POA: Diagnosis not present

## 2022-12-01 DIAGNOSIS — E1151 Type 2 diabetes mellitus with diabetic peripheral angiopathy without gangrene: Secondary | ICD-10-CM | POA: Diagnosis not present

## 2022-12-01 DIAGNOSIS — M7989 Other specified soft tissue disorders: Secondary | ICD-10-CM | POA: Diagnosis not present

## 2022-12-01 DIAGNOSIS — M24575 Contracture, left foot: Secondary | ICD-10-CM | POA: Diagnosis not present

## 2022-12-01 DIAGNOSIS — L97522 Non-pressure chronic ulcer of other part of left foot with fat layer exposed: Secondary | ICD-10-CM | POA: Diagnosis not present

## 2022-12-01 DIAGNOSIS — E1142 Type 2 diabetes mellitus with diabetic polyneuropathy: Secondary | ICD-10-CM | POA: Diagnosis not present

## 2022-12-06 DIAGNOSIS — L97522 Non-pressure chronic ulcer of other part of left foot with fat layer exposed: Secondary | ICD-10-CM | POA: Diagnosis not present

## 2022-12-07 ENCOUNTER — Ambulatory Visit: Payer: 59 | Admitting: Urology

## 2022-12-07 ENCOUNTER — Other Ambulatory Visit: Payer: Self-pay

## 2022-12-07 ENCOUNTER — Emergency Department (HOSPITAL_BASED_OUTPATIENT_CLINIC_OR_DEPARTMENT_OTHER)
Admission: EM | Admit: 2022-12-07 | Discharge: 2022-12-07 | Disposition: A | Discharge status: Home / Self Care | Payer: 59 | Attending: Emergency Medicine | Admitting: Emergency Medicine

## 2022-12-07 ENCOUNTER — Emergency Department (HOSPITAL_BASED_OUTPATIENT_CLINIC_OR_DEPARTMENT_OTHER): Payer: 59

## 2022-12-07 ENCOUNTER — Encounter (HOSPITAL_BASED_OUTPATIENT_CLINIC_OR_DEPARTMENT_OTHER): Payer: Self-pay | Admitting: Urology

## 2022-12-07 DIAGNOSIS — M25572 Pain in left ankle and joints of left foot: Secondary | ICD-10-CM | POA: Diagnosis not present

## 2022-12-07 DIAGNOSIS — Z794 Long term (current) use of insulin: Secondary | ICD-10-CM | POA: Insufficient documentation

## 2022-12-07 DIAGNOSIS — E119 Type 2 diabetes mellitus without complications: Secondary | ICD-10-CM | POA: Diagnosis not present

## 2022-12-07 DIAGNOSIS — M25531 Pain in right wrist: Secondary | ICD-10-CM | POA: Diagnosis not present

## 2022-12-07 DIAGNOSIS — S0990XA Unspecified injury of head, initial encounter: Secondary | ICD-10-CM | POA: Insufficient documentation

## 2022-12-07 DIAGNOSIS — M542 Cervicalgia: Secondary | ICD-10-CM | POA: Diagnosis not present

## 2022-12-07 DIAGNOSIS — M25561 Pain in right knee: Secondary | ICD-10-CM | POA: Insufficient documentation

## 2022-12-07 DIAGNOSIS — M545 Low back pain, unspecified: Secondary | ICD-10-CM | POA: Diagnosis not present

## 2022-12-07 DIAGNOSIS — M4802 Spinal stenosis, cervical region: Secondary | ICD-10-CM | POA: Diagnosis not present

## 2022-12-07 DIAGNOSIS — M19041 Primary osteoarthritis, right hand: Secondary | ICD-10-CM | POA: Diagnosis not present

## 2022-12-07 DIAGNOSIS — Z7984 Long term (current) use of oral hypoglycemic drugs: Secondary | ICD-10-CM | POA: Insufficient documentation

## 2022-12-07 DIAGNOSIS — W108XXA Fall (on) (from) other stairs and steps, initial encounter: Secondary | ICD-10-CM | POA: Diagnosis not present

## 2022-12-07 DIAGNOSIS — S199XXA Unspecified injury of neck, initial encounter: Secondary | ICD-10-CM | POA: Diagnosis not present

## 2022-12-07 DIAGNOSIS — M79641 Pain in right hand: Secondary | ICD-10-CM | POA: Diagnosis not present

## 2022-12-07 DIAGNOSIS — M51369 Other intervertebral disc degeneration, lumbar region without mention of lumbar back pain or lower extremity pain: Secondary | ICD-10-CM | POA: Diagnosis not present

## 2022-12-07 DIAGNOSIS — M1711 Unilateral primary osteoarthritis, right knee: Secondary | ICD-10-CM | POA: Diagnosis not present

## 2022-12-07 DIAGNOSIS — M4856XA Collapsed vertebra, not elsewhere classified, lumbar region, initial encounter for fracture: Secondary | ICD-10-CM | POA: Diagnosis not present

## 2022-12-07 DIAGNOSIS — I700 Atherosclerosis of aorta: Secondary | ICD-10-CM | POA: Diagnosis not present

## 2022-12-07 DIAGNOSIS — W19XXXA Unspecified fall, initial encounter: Secondary | ICD-10-CM

## 2022-12-07 DIAGNOSIS — S3992XA Unspecified injury of lower back, initial encounter: Secondary | ICD-10-CM | POA: Diagnosis not present

## 2022-12-07 MED ORDER — IBUPROFEN 600 MG PO TABS: 600.0000 mg | ORAL_TABLET | Freq: Four times a day (QID) | ORAL | 0 refills | Status: DC | PRN

## 2022-12-07 MED ORDER — METHOCARBAMOL 500 MG PO TABS
500.0000 mg | ORAL_TABLET | Freq: Three times a day (TID) | ORAL | 0 refills | Status: DC | PRN
Start: 2022-12-07 — End: 2023-12-12

## 2022-12-07 NOTE — Discharge Instructions (Signed)
Contact a health care provider if: ?You have more pain or swelling in the injured area. ?Get help right away if: ?You have numbness or tingling in the injured area. ?You lose a lot of strength in the injured area. ?

## 2022-12-07 NOTE — ED Provider Notes (Signed)
[place] [facility] AT [facility] Provider Note   CSN: [id number] Arrival date & time: 12/07/22  1151     History  Chief Complaint  Patient presents with   Lee Reid is a 67 y.o. male with a past history of obesity and diabetes who presents emergency department for evaluation of multiple painful joints after fall.  Patient states that he slipped going up stairs on plastic coating the carpet.  He fell forward and braced himself with his right knee and wrist.  He hit his chin on the stairs.  He denies loss of consciousness, dental injury, tongue injury, upper extremity weakness or paresthesias.  Patient states that when he was able to get up he realized that his shoes were at the bottom of the stairs.  He stood up to go down and the plastic slipped again.  He fell backward hitting the back of his head and his bottom.  He complains of neck pain, head pain, right wrist pain, knee pain and lumbar pain.  No weakness or paresthesia of the lower extremities.  He did not try anything for pain prior to arrival.   [facility] Medications Prior to Admission medications   Medication Sig Start Date End Date Taking? Authorizing Provider  Empagliflozin (JARDIANCE PO) Take by mouth.    [provider]  gabapentin (NEURONTIN) 300 MG capsule Take 1 capsule (300 mg total) by mouth 3 (three) times daily. 03/27/22 04/26/22  Lonell Grandchild, MD  glipiZIDE (GLUCOTROL XL) 5 MG 24 hr tablet Take 5 mg by mouth daily. 06/20/22   [provider]  LANTUS 100 UNIT/ML injection Inject 10 Units into the skin at bedtime. 06/28/22   [provider]  MOUNJARO 2.5 MG/0.5ML Pen Inject into the skin. 06/02/22   [provider]  oxyCODONE (ROXICODONE) 5 MG immediate release tablet Take 1 tablet (5 mg total) by mouth every 4 (four) hours as needed for severe pain (pain score 7-10). 11/22/22   Alvira Monday, MD  silodosin (RAPAFLO) 8 MG CAPS  capsule Take 1 capsule (8 mg total) by mouth daily with breakfast. 11/04/22   Stoneking, Danford Bad., MD  tadalafil (CIALIS) 20 MG tablet Take 1 tablet (20 mg total) by mouth daily as needed for erectile dysfunction. 07/19/22   Stoneking, Danford Bad., MD      Allergies    Bee venom    Review of Systems   Review of Systems  Physical Exam Updated Vital Signs BP (!) 107/52 (BP Location: Right Arm)   Pulse 80   Temp 98.3 F (36.8 C) (Oral)   Resp 17   Ht 6\' 1"  (1.854 m)   Wt 117.9 kg   SpO2 95%   BMI 34.29 kg/m  Physical Exam Vitals and nursing note reviewed.  Constitutional:      General: He is not in acute distress.    Appearance: He is well-developed. He is not diaphoretic.  HENT:     Head: Normocephalic and atraumatic.     Comments: No signs or symptoms of head trauma. Eyes:     General: No scleral icterus.    Extraocular Movements: Extraocular movements intact.     Conjunctiva/sclera: Conjunctivae normal.     Pupils: Pupils are equal, round, and reactive to light.  Neck:     Comments: No midline spinal tenderness, bilateral cervical paraspinal muscle tenderness.  Stiffness noted in the traps and shoulders.  Range of motion is  not limited.  Cardiovascular:     Rate and Rhythm: Normal rate and regular rhythm.     Heart sounds: Normal heart sounds.  Pulmonary:     Effort: Pulmonary effort is normal. No respiratory distress.     Breath sounds: Normal breath sounds.  Abdominal:     Palpations: Abdomen is soft.     Tenderness: There is no abdominal tenderness.  Musculoskeletal:     Comments: No line tenderness of the back.  Normal strength in the lower extremities.  No obvious signs of deformity or swelling in the right knee.  Left ankle without evidence of obvious fractures or dislocations.  Full range of motion.  Patient has been ambulatory prior to arrival.  Right wrist examination reveals no obvious signs of fracture or dislocation.  No swelling.  Sensory and motor nerves  intact.  Skin:    General: Skin is warm and dry.  Neurological:     Mental Status: He is alert.  Psychiatric:        Behavior: Behavior normal.     ED Results / Procedures / Treatments   Labs (all labs ordered are listed, but only abnormal results are displayed) Labs Reviewed - No data to display  EKG None  Radiology CT Cervical Spine Wo Contrast  Result Date: 12/07/2022 CLINICAL DATA:  Neck trauma (Age >= 65y) EXAM: CT CERVICAL SPINE WITHOUT CONTRAST TECHNIQUE: Multidetector CT imaging of the cervical spine was performed without intravenous contrast. Multiplanar CT image reconstructions were also generated. RADIATION DOSE REDUCTION: This exam was performed according to the departmental dose-optimization program which includes automated exposure control, adjustment of the mA and/or kV according to patient size and/or use of iterative reconstruction technique. COMPARISON:  None Available. FINDINGS: Alignment: Straightening.  No substantial sagittal subluxation. Skull base and vertebrae: Vertebral heights maintained. No evidence of acute fracture. Soft tissues and spinal canal: No prevertebral fluid or swelling. No visible canal hematoma. Disc levels: Multilevel facet uncovertebral hypertrophy with varying degrees of neural foraminal stenosis. Upper chest: The visualized lung apices are clear. IMPRESSION: No evidence of acute fracture or traumatic malalignment. Electronically Signed   By: Feliberto Harts M.D.   On: 12/07/2022 14:13   CT Head Wo Contrast  Result Date: 12/07/2022 CLINICAL DATA:  Head trauma, GCS=15, no focal neuro findings (low risk) (Ped 0-17y) EXAM: CT HEAD WITHOUT CONTRAST TECHNIQUE: Contiguous axial images were obtained from the base of the skull through the vertex without intravenous contrast. RADIATION DOSE REDUCTION: This exam was performed according to the departmental dose-optimization program which includes automated exposure control, adjustment of the mA and/or kV  according to patient size and/or use of iterative reconstruction technique. COMPARISON:  CT head 10/04/2019. FINDINGS: Brain: No evidence of acute infarction, hemorrhage, hydrocephalus, extra-axial collection or mass lesion/mass effect. Patchy white matter hypodensities, nonspecific but compatible with chronic microvascular ischemic change. Vascular: No hyperdense vessel. Skull: No acute fracture. Sinuses/Orbits: Postoperative changes of the left globe. Clear sinuses. IMPRESSION: No evidence of acute intracranial abnormality. Electronically Signed   By: Feliberto Harts M.D.   On: 12/07/2022 14:09    Procedures Procedures    Medications Ordered in ED Medications - No data to display  ED Course/ Medical Decision Making/ A&P Clinical Course as of 12/07/22 1613  Tue Dec 07, 2022  1446 I visualized and interpreted CT head, CT C-spine, CT lumbar spine which showed no acute findings as per radiology.  I also visualized and interpreted knee hand and ankle x-ray.  I do not see any  signs of fracture or dislocation. [AH]  1608 I discussed my interpretation of the plain films with the patient.  Patient in shared decision making would like to leave at this point as the plain film reads are taking an inordinate amount of time to be read by radiology.  And the fact that he has already been here for 4 hours and 20 minutes and had reads done at 1:00 he would like to be discharged.  I feel like this is fine as he is ambulatory without obvious signs of fracture.  Will discharge with anti-inflammatory medications and muscle relaxers. [AH]    Clinical Course User Index [AH] Arthor Captain, PA-C                                 Medical Decision Making Amount and/or Complexity of Data Reviewed Radiology: ordered and independent interpretation performed.  Risk Prescription drug management.   See ED course        Final Clinical Impression(s) / ED Diagnoses Final diagnoses:  None    Rx / DC  Orders ED Discharge Orders     None         Arthor Captain, PA-C 12/07/22 1614    Alvira Monday, MD 12/07/22 [phone]

## 2022-12-07 NOTE — ED Triage Notes (Signed)
Pt states mechanical slip and fall last night at 0000 Pt states hit chin and hit snapped his neck pain  Also slipped when getting up and landed on back  States right hand pain from breaking fall and left ankle pain  States hit head but denies LOC   Not on thinners

## 2022-12-07 NOTE — ED Notes (Signed)
Pt alert and oriented X 4 at the time of discharge. RR even and unlabored. No acute distress noted. Pt verbalized understanding of discharge instructions as discussed. Pt transported to lobby in a wheelchair at time of discharge.

## 2022-12-07 NOTE — ED Notes (Signed)
Fall risk armband Fall risk sign on door Patient wearing shoes

## 2022-12-08 DIAGNOSIS — M542 Cervicalgia: Secondary | ICD-10-CM | POA: Diagnosis not present

## 2022-12-10 ENCOUNTER — Encounter: Payer: Self-pay | Admitting: Urology

## 2022-12-10 ENCOUNTER — Ambulatory Visit (INDEPENDENT_AMBULATORY_CARE_PROVIDER_SITE_OTHER): Payer: 59 | Admitting: Urology

## 2022-12-10 VITALS — BP 137/85 | HR 88 | Ht 73.0 in | Wt 280.0 lb

## 2022-12-10 DIAGNOSIS — N138 Other obstructive and reflux uropathy: Secondary | ICD-10-CM

## 2022-12-10 DIAGNOSIS — R300 Dysuria: Secondary | ICD-10-CM | POA: Diagnosis not present

## 2022-12-10 DIAGNOSIS — N401 Enlarged prostate with lower urinary tract symptoms: Secondary | ICD-10-CM

## 2022-12-10 LAB — URINALYSIS, ROUTINE W REFLEX MICROSCOPIC
Bilirubin, UA: NEGATIVE
Ketones, UA: NEGATIVE
Leukocytes,UA: NEGATIVE
Nitrite, UA: NEGATIVE
Protein,UA: NEGATIVE
RBC, UA: NEGATIVE
Specific Gravity, UA: 1.01 (ref 1.005–1.030)
Urobilinogen, Ur: 0.2 mg/dL (ref 0.2–1.0)
pH, UA: 5.5 (ref 5.0–7.5)

## 2022-12-10 LAB — BLADDER SCAN AMB NON-IMAGING

## 2022-12-10 NOTE — Progress Notes (Signed)
Assessment: 1. BPH with obstruction/lower urinary tract symptoms     Plan: Continue tadalafil 20 mg prn  Continue silodosin 8 mg daily  Schedule for cystoscopy in next 2 weeks   Chief Complaint:  Chief Complaint  Patient presents with   Benign Prostatic Hypertrophy    History of Present Illness:  Lee Reid is a 67 y.o. male who is seen for continued evaluation of BPH with LUTS and ED.  At his initial visit in June 2024, he reported onset of dysuria about 2 months prior.  He was treated with antibiotics with some improvement in symptoms.  His symptoms worsened after finishing antibiotics.  He was seen in the ER on 04/27/22 for evaluation of back pain and leg pain.  Urinalysis showed 6-10 WBC, 6-10 RBC, and few bacteria.  WBC 6.4K.  CT imaging showed normal kidneys, no stones or obstruction, bladder wall thickening, and enlarged prostate.  He was again treated with antibiotics and symptoms improved.  At the time of his visit in June 2024, he reported mild dysuria, nocturia 2-4x.   His urinalysis showed evidence of trichomonas.  He was treated with Flagyl 2 g x 1. He was also given a trial of alfuzosin 10 mg daily for his BPH with lower urinary tract symptoms. PVR = 0 ml in 6/24  At his visit in July 2024, he reported that the dysuria had completely resolved.  He never started taking the alfuzosin.  He continued to have lower urinary tract symptoms including sensation of incomplete emptying, intermittent stream, decreased stream, frequency and urgency.  He also had nocturia x 4. IPSS = 31. He also reported difficulty achieving and maintaining his erections.  He was using sildenafil 100 mg as needed.  He did not feel like this was working as well for him as it once did.  He was given a trial of tadalafil 20 mg as needed.  At his visit in 9/24, he continued on alfuzosin.  He reported problems with urgency and urge incontinence.  He also reported slight dysuria.   He was concerned  about STDs.  He reported a monogamous relationship with his fiance. He was using tadalafil 20 mg as needed with some improvement in his erections. 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  He was treated with doxycycline x 7 days.  At his visit in 10/24, he continued on alfuzosin.  He continued to feel like he was not able to empty his bladder completely.  He was also having some slight dysuria.  He remained concerned about the possibility of STDs.  No gross hematuria or flank pain.  No urethral discharge. He was using tadalafil 20 mg as needed for erectile dysfunction. PVR = 85 ml. PSA 1.5 STD panel negative He was started on silodosin 8 mg daily in place of alfuzosin.  He returns today for follow-up.  He continues on silodosin.  He has not noted any improvement in his symptoms.  He continues to feel like his bladder does not empty and has postvoid dribbling.  He also has hesitancy and straining.  No dysuria.  Prostate volume measures approximately 109.9 cm on the CT study from 4/24.  Portions of the above documentation were copied from a prior visit for review purposes only.   Past Medical History:  Past Medical History:  Diagnosis Date   Anginal pain (HCC)    Anxiety    Asthma    Chronic back pain    Depression    Diabetes mellitus without complication (  HCC)    Dysrhythmia    irregular heartbeat due to stress   GERD (gastroesophageal reflux disease)    Heart murmur    born with heart murmur   Pneumonia     Past Surgical History:  Past Surgical History:  Procedure Laterality Date   CHOLECYSTECTOMY N/A 09/16/2014   Procedure: LAPAROSCOPIC CHOLECYSTECTOMY ;  Surgeon: De Blanch Kinsinger, MD;  Location: [facility];  Service: General;  Laterality: N/A;   EYE SURGERY     TESTICLE SURGERY     growth removed   TONSILLECTOMY      Allergies:  Allergies  Allergen Reactions   Bee Venom     Family History:  Family History  Problem Relation Age of Onset   Hypertension Mother     Heart disease Father    Cancer Father     Social History:  Social History   Tobacco Use   Smoking status: Every Day    Current packs/day: 0.25    Types: Cigarettes   Smokeless tobacco: Never  Vaping Use   Vaping status: Former  Substance Use Topics   Alcohol use: Not Currently    Comment: occ   Drug use: No    ROS: Constitutional:  Negative for fever, chills, weight loss CV: Negative for chest pain, previous MI, hypertension Respiratory:  Negative for shortness of breath, wheezing, sleep apnea, frequent cough GI:  Negative for nausea, vomiting, bloody stool, GERD  Physical exam: BP 137/85   Pulse 88   Ht 6\' 1"  (1.854 m)   Wt 280 lb (127 kg)   BMI 36.94 kg/m  GENERAL APPEARANCE:  Well appearing, well developed, well nourished, NAD HEENT:  Atraumatic, normocephalic, oropharynx clear NECK:  Supple without lymphadenopathy or thyromegaly ABDOMEN:  Soft, non-tender, no masses EXTREMITIES:  Moves all extremities well, without clubbing, cyanosis, or edema NEUROLOGIC:  Alert and oriented x 3, normal gait, CN II-XII grossly intact MENTAL STATUS:  appropriate BACK:  Non-tender to palpation, No CVAT SKIN:  Warm, dry, and intact   Results: U/A: 2+ glucose  PVR:  189 ml

## 2022-12-14 ENCOUNTER — Emergency Department (HOSPITAL_COMMUNITY): Payer: 59

## 2022-12-14 ENCOUNTER — Other Ambulatory Visit: Payer: Self-pay

## 2022-12-14 ENCOUNTER — Encounter (HOSPITAL_BASED_OUTPATIENT_CLINIC_OR_DEPARTMENT_OTHER): Payer: Self-pay

## 2022-12-14 ENCOUNTER — Emergency Department (HOSPITAL_BASED_OUTPATIENT_CLINIC_OR_DEPARTMENT_OTHER)
Admission: EM | Admit: 2022-12-14 | Discharge: 2022-12-14 | Disposition: A | Discharge status: Home / Self Care | Payer: 59 | Attending: Emergency Medicine | Admitting: Emergency Medicine

## 2022-12-14 DIAGNOSIS — R531 Weakness: Secondary | ICD-10-CM | POA: Diagnosis not present

## 2022-12-14 DIAGNOSIS — E119 Type 2 diabetes mellitus without complications: Secondary | ICD-10-CM | POA: Insufficient documentation

## 2022-12-14 DIAGNOSIS — G459 Transient cerebral ischemic attack, unspecified: Secondary | ICD-10-CM | POA: Diagnosis not present

## 2022-12-14 DIAGNOSIS — J45909 Unspecified asthma, uncomplicated: Secondary | ICD-10-CM | POA: Diagnosis not present

## 2022-12-14 DIAGNOSIS — M4712 Other spondylosis with myelopathy, cervical region: Secondary | ICD-10-CM | POA: Diagnosis not present

## 2022-12-14 DIAGNOSIS — M4716 Other spondylosis with myelopathy, lumbar region: Secondary | ICD-10-CM | POA: Diagnosis not present

## 2022-12-14 DIAGNOSIS — M5105 Intervertebral disc disorders with myelopathy, thoracolumbar region: Secondary | ICD-10-CM | POA: Diagnosis not present

## 2022-12-14 DIAGNOSIS — Z7984 Long term (current) use of oral hypoglycemic drugs: Secondary | ICD-10-CM | POA: Diagnosis not present

## 2022-12-14 DIAGNOSIS — M4714 Other spondylosis with myelopathy, thoracic region: Secondary | ICD-10-CM | POA: Diagnosis not present

## 2022-12-14 DIAGNOSIS — M4802 Spinal stenosis, cervical region: Secondary | ICD-10-CM | POA: Insufficient documentation

## 2022-12-14 DIAGNOSIS — M542 Cervicalgia: Secondary | ICD-10-CM | POA: Diagnosis present

## 2022-12-14 DIAGNOSIS — I6782 Cerebral ischemia: Secondary | ICD-10-CM | POA: Diagnosis not present

## 2022-12-14 MED ORDER — KETOROLAC TROMETHAMINE 60 MG/2ML IM SOLN
30.0000 mg | Freq: Once | INTRAMUSCULAR | Status: AC
  Administered 2022-12-14: 30 mg via INTRAMUSCULAR
  Filled 2022-12-14: qty 2

## 2022-12-14 MED ORDER — LORAZEPAM 2 MG/ML IJ SOLN: 1.0000 mg | INTRAMUSCULAR | Status: DC | PRN

## 2022-12-14 NOTE — ED Provider Notes (Addendum)
[place] [facility] AT [facility] Provider Note   CSN: [id number] Arrival date & time: 12/14/22  8295     History  Chief Complaint  Patient presents with   Pain    Lee Reid is a 67 y.o. male.  HPI   67 year old male with medical history significant for diabetes mellitus, asthma, anxiety, depression, GERD, chronic back pain who presents to the emergency department with multiple complaints.  The patient states that he fell up and then down the stairs last week.  He had CT imaging of the head, cervical spine and lower lumbar spine which showed no acute abnormalities.  His CT imaging of the lumbar spine had shown a chronic compression fracture at L1.  He also was found to have multilevel disc degeneration, greatest at L2-L3 [place] there was mild spinal stenosis and moderate neural for aminal stenosis noted on CT.  Since his fall, he has been complaining of bilateral upper extremity weakness, cervical radiculopathy rating down the right arm.  He endorses a burning sensation as well as pain shooting down to his fingertips with associated paresthesias.  He states that he is also experiencing lower back pain.  He denies any new falls or trauma.  He denies any saddle anesthesia.  He denies any fecal incontinence, states that he has had sensation of incomplete voiding that is worse since the fall.  He endorses weakness in the right lower extremity that is new.  He also states that he has intermittently had slurred speech.  Home Medications Prior to Admission medications   Medication Sig Start Date End Date Taking? Authorizing Provider  Empagliflozin (JARDIANCE PO) Take by mouth.   Yes [provider]  glipiZIDE (GLUCOTROL XL) 5 MG 24 hr tablet Take 5 mg by mouth daily. 06/20/22  Yes [provider]  ibuprofen (ADVIL) 600 MG tablet Take 1 tablet (600 mg total) by mouth every 6 (six) hours as needed. 12/07/22  Yes Harris, Abigail, PA-C  LANTUS 100  UNIT/ML injection Inject 10 Units into the skin at bedtime. 06/28/22  Yes [provider]  MOUNJARO 2.5 MG/0.5ML Pen Inject into the skin. 06/02/22  Yes [provider]  silodosin (RAPAFLO) 8 MG CAPS capsule Take 1 capsule (8 mg total) by mouth daily with breakfast. 11/04/22  Yes Stoneking, Danford Bad., MD  gabapentin (NEURONTIN) 300 MG capsule Take 1 capsule (300 mg total) by mouth 3 (three) times daily. 03/27/22 04/26/22  Lonell Grandchild, MD  methocarbamol (ROBAXIN) 500 MG tablet Take 1 tablet (500 mg total) by mouth 3 (three) times daily as needed for muscle spasms. 12/07/22   Harris, Cammy Copa, PA-C  oxyCODONE (ROXICODONE) 5 MG immediate release tablet Take 1 tablet (5 mg total) by mouth every 4 (four) hours as needed for severe pain (pain score 7-10). 11/22/22   Alvira Monday, MD  tadalafil (CIALIS) 20 MG tablet Take 1 tablet (20 mg total) by mouth daily as needed for erectile dysfunction. 07/19/22   Stoneking, Danford Bad., MD      Allergies    Bee venom    Review of Systems   Review of Systems  All other systems reviewed and are negative.   Physical Exam Updated Vital Signs BP 123/81   Pulse 82   Temp 97.8 F (36.6 C)   Resp 13   Ht 6\' 1"  (1.854 m)   Wt 127 kg   SpO2 97%   BMI 36.94 kg/m  Physical Exam Vitals and nursing note reviewed.  Constitutional:  General: He is not in acute distress.    Appearance: He is well-developed.  HENT:     Head: Normocephalic and atraumatic.  Eyes:     Conjunctiva/sclera: Conjunctivae normal.  Neck:     Comments: Soft collar in place, no TTP Cardiovascular:     Rate and Rhythm: Normal rate and regular rhythm.  Pulmonary:     Effort: Pulmonary effort is normal. No respiratory distress.     Breath sounds: Normal breath sounds.  Abdominal:     Palpations: Abdomen is soft.     Tenderness: There is no abdominal tenderness.  Musculoskeletal:        General: No swelling.     Cervical back: Neck supple.     Comments: TTP  of the lumbar spine  Skin:    General: Skin is warm and dry.     Capillary Refill: Capillary refill takes less than 2 seconds.  Neurological:     Mental Status: He is alert.     Comments: MENTAL STATUS EXAM:    Orientation: Alert and oriented to person, place and time.  Memory: Cooperative, follows commands well.  Language: Speech is clear and language is normal.   CRANIAL NERVES:    CN 2 (Optic): Visual fields intact to confrontation, blind in the left eye, chronic CN 3,4,6 (EOM): Pupils equal and reactive to light. Full extraocular eye movement without nystagmus.  CN 5 (Trigeminal): Facial sensation is normal, no weakness of masticatory muscles.  CN 7 (Facial): No facial weakness or asymmetry.  CN 8 (Auditory): Auditory acuity grossly normal.  CN 9,10 (Glossophar): The uvula is midline, the palate elevates symmetrically.  CN 11 (spinal access): Normal sternocleidomastoid and trapezius strength.  CN 12 (Hypoglossal): The tongue is midline. No atrophy or fasciculations.Marland Kitchen   MOTOR:  Muscle Strength: 4/5RUE, 4/5LUE, 4/5RLE, 5/5LLE.   COORDINATION:   No tremor.   SENSATION:   Intact to light touch all four extremities.     Psychiatric:        Mood and Affect: Mood normal.     ED Results / Procedures / Treatments   Labs (all labs ordered are listed, but only abnormal results are displayed) Labs Reviewed - No data to display  EKG None  Radiology No results found.  Procedures Procedures    Medications Ordered in ED Medications  LORazepam (ATIVAN) injection 1 mg (has no administration in time range)  ketorolac (TORADOL) injection 30 mg (30 mg Intramuscular Given 12/14/22 6644)    ED Course/ Medical Decision Making/ A&P                                 Medical Decision Making Amount and/or Complexity of Data Reviewed Radiology: ordered.  Risk Prescription drug management.    67 year old male with medical history significant for diabetes mellitus, asthma,  anxiety, depression, GERD, chronic back pain who presents to the emergency department with multiple complaints.  The patient states that he fell up and then down the stairs last week.  He had CT imaging of the head, cervical spine and lower lumbar spine which showed no acute abnormalities.  His CT imaging of the lumbar spine had shown a chronic compression fracture at L1.  He also was found to have multilevel disc degeneration, greatest at L2-L3 [place] there was mild spinal stenosis and moderate neural for aminal stenosis noted on CT.  Since his fall, he has been complaining of bilateral upper extremity weakness,  cervical radiculopathy rating down the right arm.  He endorses a burning sensation as well as pain shooting down to his fingertips with associated paresthesias.  He states that he is also experiencing lower back pain.  He denies any new falls or trauma.  He denies any saddle anesthesia.  He denies any fecal incontinence, states that he has had sensation of incomplete voiding that is worse since the fall.  He endorses weakness in the right lower extremity that is new.  He also states that he has intermittently had slurred speech.  On arrival, the patient was vitally stable.  Presenting with worsening cervical radiculopathy status post fall.  Has known degenerative disc disease and a chronic compression fracture in the L-spine but worsening pain, no new falls or trauma.  Has a sensation of incomplete voiding, experiencing worsening myelopathy in the right lower extremity that is new since his fall.  He also has had intermittent slurred speech.  Considered TIA versus CVA, last known normal unclear.  Also considered degenerative disc disease and herniated disc causing myelopathy.  Lower suspicion for cauda equina syndrome, low concern for epidural abscess, transverse myelitis however given the worsening myelopathy since his fall, will obtain MRI imaging of the spine.  IM Toradol administered for pain  control.  Discussed with the patient that MRI imaging is not available at this facility, patient comfortable with the plan for POV transfer to [facility] for MRI imaging to further evaluate, Dr. Durwin Nora accepting in transfer.    Final Clinical Impression(s) / ED Diagnoses Final diagnoses:  Cervical radiculopathy  Weakness    Rx / DC Orders ED Discharge Orders     None         Ernie Avena, MD 12/14/22 0981    Ernie Avena, MD 12/14/22 1914    Ernie Avena, MD 12/14/22 0945    Ernie Avena, MD 12/14/22 [phone]

## 2022-12-14 NOTE — ED Notes (Signed)
Pt aware of driving to [facility] for his MRI. Pt has no IV placed and his paperwork was given to pt to take to [facility].

## 2022-12-14 NOTE — ED Notes (Signed)
Pt stated that he didn't want to have all the monitors on because he was going to go to another hospital for an MRI per doctor.

## 2022-12-14 NOTE — ED Notes (Signed)
Patient transported to MRI 

## 2022-12-14 NOTE — Discharge Instructions (Addendum)
Your MRIs show that you have narrowing in your spinal canal called spinal stenosis.  This could be causing your pain and weakness.  You need to follow-up with a spine specialist/neurosurgeon and have been referred to one.  Call their office tomorrow to set up outpatient follow up.  If you develop worsening, recurrent, or continued neck or back pain, numbness or weakness in the legs, incontinence of your bowels or bladders, numbness of your buttocks, fever, abdominal pain, or any other new/concerning symptoms then return to the ER for evaluation.

## 2022-12-14 NOTE — ED Notes (Signed)
Pt in no acute distress at time of discharge. Pt states he had no further questions. Pt alert and oriented x4.

## 2022-12-14 NOTE — ED Notes (Signed)
Fall wrist armband  Fall risk sign on door  Patient wearing shoes

## 2022-12-14 NOTE — ED Notes (Signed)
Patient requesting to speak with MD prior to being discharged. MD now at bedside speaking with patient.

## 2022-12-14 NOTE — ED Provider Notes (Addendum)
11:46 AM Patient transferred from [facility].  Endorses progressive right-sided weakness since his fall.  Had negative imaging.  He is having neck pain but now also is having slurred speech, headache.  He is pending MRIs.  Given his history including diabetes we will also check some lab work and an EKG.   Since coming back from MRI, patient has been walking around.  To me, his right hand seems a little smaller than his left, and chart review shows he is had weakness in both extremities and I suspect he has more chronic weakness than he is letting on.  The MRI shows pretty significant spinal canal stenosis which does seem to be worse at his neck.  However this does not seem to be a traumatic finding.  There is no emergent condition.  I did page neurosurgery but then patient went to talk again and really just wants to leave.  I do not think he needs an emergent decompression and so I think is reasonable to discharge him.  I did offer to do labs since he is a diabetic and is complaining of multiple complaints but he declines.  He does not want a wait on neurosurgery here but would like an outpatient referral.  Will discharge home with return precautions.   Pricilla Loveless, MD 12/14/22 1538  3:52 PM Did discuss with Dr. Conchita Paris after patient left.  He was in the OR.  He advises that this is more of a chronic condition and more of a peripheral problem and can follow-up the patient in about a week or so.  I did already give the patient his contact information but there is no need for an emergent decompression or other treatment in the hospital.  Patient has already left.   Pricilla Loveless, MD 12/14/22 [phone]

## 2022-12-14 NOTE — ED Triage Notes (Signed)
Pt has multiple complaints. States that he has a burning in sensation in this neck and shoulders

## 2022-12-22 DIAGNOSIS — M542 Cervicalgia: Secondary | ICD-10-CM | POA: Diagnosis not present

## 2022-12-22 DIAGNOSIS — Z794 Long term (current) use of insulin: Secondary | ICD-10-CM | POA: Diagnosis not present

## 2022-12-22 DIAGNOSIS — E782 Mixed hyperlipidemia: Secondary | ICD-10-CM | POA: Diagnosis not present

## 2022-12-22 DIAGNOSIS — E1142 Type 2 diabetes mellitus with diabetic polyneuropathy: Secondary | ICD-10-CM | POA: Diagnosis not present

## 2022-12-22 DIAGNOSIS — E119 Type 2 diabetes mellitus without complications: Secondary | ICD-10-CM | POA: Diagnosis not present

## 2022-12-22 DIAGNOSIS — E538 Deficiency of other specified B group vitamins: Secondary | ICD-10-CM | POA: Diagnosis not present

## 2022-12-28 ENCOUNTER — Other Ambulatory Visit: Payer: 59 | Admitting: Urology

## 2022-12-29 DIAGNOSIS — M25511 Pain in right shoulder: Secondary | ICD-10-CM | POA: Diagnosis not present

## 2022-12-29 DIAGNOSIS — M1990 Unspecified osteoarthritis, unspecified site: Secondary | ICD-10-CM | POA: Diagnosis not present

## 2023-01-03 DIAGNOSIS — Z794 Long term (current) use of insulin: Secondary | ICD-10-CM | POA: Diagnosis not present

## 2023-01-03 DIAGNOSIS — E538 Deficiency of other specified B group vitamins: Secondary | ICD-10-CM | POA: Diagnosis not present

## 2023-01-03 DIAGNOSIS — R21 Rash and other nonspecific skin eruption: Secondary | ICD-10-CM | POA: Diagnosis not present

## 2023-01-03 DIAGNOSIS — E1142 Type 2 diabetes mellitus with diabetic polyneuropathy: Secondary | ICD-10-CM | POA: Diagnosis not present

## 2023-01-03 DIAGNOSIS — E119 Type 2 diabetes mellitus without complications: Secondary | ICD-10-CM | POA: Diagnosis not present

## 2023-01-03 DIAGNOSIS — E782 Mixed hyperlipidemia: Secondary | ICD-10-CM | POA: Diagnosis not present

## 2023-01-21 ENCOUNTER — Encounter: Payer: Self-pay | Admitting: Urology

## 2023-01-21 ENCOUNTER — Ambulatory Visit (INDEPENDENT_AMBULATORY_CARE_PROVIDER_SITE_OTHER): Payer: 59 | Admitting: Urology

## 2023-01-21 VITALS — BP 137/84 | HR 74 | Ht 73.0 in | Wt 275.0 lb

## 2023-01-21 DIAGNOSIS — N138 Other obstructive and reflux uropathy: Secondary | ICD-10-CM

## 2023-01-21 DIAGNOSIS — R300 Dysuria: Secondary | ICD-10-CM | POA: Diagnosis not present

## 2023-01-21 DIAGNOSIS — N529 Male erectile dysfunction, unspecified: Secondary | ICD-10-CM | POA: Diagnosis not present

## 2023-01-21 DIAGNOSIS — Z711 Person with feared health complaint in whom no diagnosis is made: Secondary | ICD-10-CM

## 2023-01-21 DIAGNOSIS — N401 Enlarged prostate with lower urinary tract symptoms: Secondary | ICD-10-CM | POA: Diagnosis not present

## 2023-01-21 LAB — URINALYSIS, ROUTINE W REFLEX MICROSCOPIC
Bilirubin, UA: NEGATIVE
Ketones, UA: NEGATIVE
Leukocytes,UA: NEGATIVE
Nitrite, UA: NEGATIVE
Protein,UA: NEGATIVE
RBC, UA: NEGATIVE
Specific Gravity, UA: 1.01 (ref 1.005–1.030)
Urobilinogen, Ur: 0.2 mg/dL (ref 0.2–1.0)
pH, UA: 6 (ref 5.0–7.5)

## 2023-01-21 MED ORDER — CIPROFLOXACIN HCL 500 MG PO TABS
500.0000 mg | ORAL_TABLET | Freq: Once | ORAL | Status: AC
  Administered 2023-01-21: 500 mg via ORAL

## 2023-01-21 NOTE — Progress Notes (Signed)
 Assessment: 1. BPH with obstruction/lower urinary tract symptoms   2. Organic impotence   3. Concern about sexually transmitted disease in male without diagnosis     Plan: I discussed the findings on cystoscopy with the patient today.  Given the prostate volume, I recommended that he consider HoLEP or robotic assisted simple prostatectomy if he is interested in surgical management for his BPH with lower urinary tract symptoms. I advised him that I do not perform either of these procedures, and if he is interested, I would refer him to one of my partners. Urine test sent for STD screening per patient request-will contact him with results. Due to his failure to respond to medical therapy for ED, I discussed other options for management of his erectile dysfunction including vacuum erection device, intracavernosal injections, and penile prosthesis. Information on VED provided.  Prescription sent to [facility]. Return to office in 3 months.  Chief Complaint:  Chief Complaint  Patient presents with   Cysto    History of Present Illness:  Lee Reid is a 68 y.o. male who is seen for continued evaluation of BPH with LUTS and ED.  At his initial visit in June 2024, he reported onset of dysuria about 2 months prior.  He was treated with antibiotics with some improvement in symptoms.  His symptoms worsened after finishing antibiotics.  He was seen in the ER on 04/27/22 for evaluation of back pain and leg pain.  Urinalysis showed 6-10 WBC, 6-10 RBC, and few bacteria.  WBC 6.4K.  CT imaging showed normal kidneys, no stones or obstruction, bladder wall thickening, and enlarged prostate.  He was again treated with antibiotics and symptoms improved.  At the time of his visit in June 2024, he reported mild dysuria, nocturia 2-4x.   His urinalysis showed evidence of trichomonas.  He was treated with Flagyl  2 g x 1. He was also given a trial of alfuzosin  10 mg daily for his BPH with lower urinary tract  symptoms. PVR = 0 ml in 6/24  At his visit in July 2024, he reported that the dysuria had completely resolved.  He never started taking the alfuzosin .  He continued to have lower urinary tract symptoms including sensation of incomplete emptying, intermittent stream, decreased stream, frequency and urgency.  He also had nocturia x 4. IPSS = 31. He also reported difficulty achieving and maintaining his erections.  He was using sildenafil 100 mg as needed.  He did not feel like this was working as well for him as it once did.  He was given a trial of tadalafil  20 mg as needed.  At his visit in 9/24, he continued on alfuzosin .  He reported problems with urgency and urge incontinence.  He also reported slight dysuria.   He was concerned about STDs.  He reported a monogamous relationship with his fiance. He was using tadalafil  20 mg as needed with some improvement in his erections. 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  He was treated with doxycycline  x 7 days.  At his visit in 10/24, he continued on alfuzosin .  He continued to feel like he was not able to empty his bladder completely.  He was also having some slight dysuria.  He remained concerned about the possibility of STDs.  No gross hematuria or flank pain.  No urethral discharge. He was using tadalafil  20 mg as needed for erectile dysfunction. PVR = 85 ml. PSA 1.5 STD panel negative He was started on silodosin  8 mg daily in  place of alfuzosin .  He had not noted any improvement in his symptoms with silodosin .  He continued to feel like his bladder does not empty and has postvoid dribbling.  He also had hesitancy and straining.  No dysuria.  Prostate volume measures approximately 109.9 cm on the CT study from 4/24.  He presents today for cystoscopy. He continues to have symptoms of incomplete emptying, intermittent stream, hesitancy, and straining.  He has discontinued the silodosin . He continue to have difficulty achieving and  maintaining erections despite use of tadalafil .  He is interested in discussing other options. He is also requesting STD testing for history of STIs and upcoming marriage.   Portions of the above documentation were copied from a prior visit for review purposes only.   Past Medical History:  Past Medical History:  Diagnosis Date   Anginal pain (HCC)    Anxiety    Asthma    Chronic back pain    Depression    Diabetes mellitus without complication (HCC)    Dysrhythmia    irregular heartbeat due to stress   GERD (gastroesophageal reflux disease)    Heart murmur    born with heart murmur   Pneumonia     Past Surgical History:  Past Surgical History:  Procedure Laterality Date   CHOLECYSTECTOMY N/A 09/16/2014   Procedure: LAPAROSCOPIC CHOLECYSTECTOMY ;  Surgeon: Herlene Righter Kinsinger, MD;  Location: [facility];  Service: General;  Laterality: N/A;   EYE SURGERY     TESTICLE SURGERY     growth removed   TONSILLECTOMY      Allergies:  Allergies  Allergen Reactions   Bee Venom     Family History:  Family History  Problem Relation Age of Onset   Hypertension Mother    Heart disease Father    Cancer Father     Social History:  Social History   Tobacco Use   Smoking status: Every Day    Current packs/day: 0.25    Types: Cigarettes   Smokeless tobacco: Never  Vaping Use   Vaping status: Former  Substance Use Topics   Alcohol use: Not Currently    Comment: occ   Drug use: No    ROS: Constitutional:  Negative for fever, chills, weight loss CV: Negative for chest pain, previous MI, hypertension Respiratory:  Negative for shortness of breath, wheezing, sleep apnea, frequent cough GI:  Negative for nausea, vomiting, bloody stool, GERD  Physical exam: BP 137/84   Pulse 74   Ht 6' 1 (1.854 m)   Wt 275 lb (124.7 kg)   BMI 36.28 kg/m  GENERAL APPEARANCE:  Well appearing, well developed, well nourished, NAD HEENT:  Atraumatic, normocephalic, oropharynx clear NECK:   Supple without lymphadenopathy or thyromegaly ABDOMEN:  Soft, non-tender, no masses EXTREMITIES:  Moves all extremities well, without clubbing, cyanosis, or edema NEUROLOGIC:  Alert and oriented x 3, normal gait, CN II-XII grossly intact MENTAL STATUS:  appropriate BACK:  Non-tender to palpation, No CVAT SKIN:  Warm, dry, and intact   Results: U/A: 2+ glucose  Procedure:  Flexible Cystourethroscopy  Pre-operative Diagnosis: Lower urinary tract symptoms  Post-operative Diagnosis: Lower urinary tract symptoms  Anesthesia:  local with lidocaine  jelly  Surgical Narrative:  After appropriate informed consent was obtained, the patient was prepped and draped in the usual sterile fashion in the supine position.  The patient was correctly identified and the proper procedure delineated prior to proceeding.  Sterile lidocaine  gel was instilled in the urethra. The flexible cystoscope was  introduced without difficulty.  Findings:  Anterior urethra: Normal  Posterior urethra: Lateral lobe hypertrophy with median lobe  Bladder: Trabeculations; limited exam due to patient cooperation  Ureteral orifices: normal  Additional findings: none  Saline bladder wash for cytology was not performed.    The cystoscope was then removed.  The patient tolerated the procedure well.

## 2023-01-25 LAB — CT, NG, MYCOPLASMAS NAA, URINE
Chlamydia trachomatis, NAA: NEGATIVE
Mycoplasma genitalium NAA: NEGATIVE
Mycoplasma hominis NAA: NEGATIVE
Neisseria gonorrhoeae, NAA: NEGATIVE
Ureaplasma spp NAA: NEGATIVE

## 2023-01-31 DIAGNOSIS — H25811 Combined forms of age-related cataract, right eye: Secondary | ICD-10-CM | POA: Diagnosis not present

## 2023-02-16 DIAGNOSIS — M25511 Pain in right shoulder: Secondary | ICD-10-CM | POA: Diagnosis not present

## 2023-02-16 DIAGNOSIS — M542 Cervicalgia: Secondary | ICD-10-CM | POA: Diagnosis not present

## 2023-02-16 DIAGNOSIS — M545 Low back pain, unspecified: Secondary | ICD-10-CM | POA: Diagnosis not present

## 2023-02-16 DIAGNOSIS — G8929 Other chronic pain: Secondary | ICD-10-CM | POA: Diagnosis not present

## 2023-03-30 DIAGNOSIS — L97522 Non-pressure chronic ulcer of other part of left foot with fat layer exposed: Secondary | ICD-10-CM | POA: Diagnosis not present

## 2023-03-30 DIAGNOSIS — E1151 Type 2 diabetes mellitus with diabetic peripheral angiopathy without gangrene: Secondary | ICD-10-CM | POA: Diagnosis not present

## 2023-03-30 DIAGNOSIS — E1142 Type 2 diabetes mellitus with diabetic polyneuropathy: Secondary | ICD-10-CM | POA: Diagnosis not present

## 2023-03-30 DIAGNOSIS — I739 Peripheral vascular disease, unspecified: Secondary | ICD-10-CM | POA: Diagnosis not present

## 2023-03-30 DIAGNOSIS — L603 Nail dystrophy: Secondary | ICD-10-CM | POA: Diagnosis not present

## 2023-03-30 DIAGNOSIS — L84 Corns and callosities: Secondary | ICD-10-CM | POA: Diagnosis not present

## 2023-04-12 DIAGNOSIS — L97522 Non-pressure chronic ulcer of other part of left foot with fat layer exposed: Secondary | ICD-10-CM | POA: Diagnosis not present

## 2023-04-13 DIAGNOSIS — R21 Rash and other nonspecific skin eruption: Secondary | ICD-10-CM | POA: Diagnosis not present

## 2023-04-13 DIAGNOSIS — E119 Type 2 diabetes mellitus without complications: Secondary | ICD-10-CM | POA: Diagnosis not present

## 2023-04-13 DIAGNOSIS — Z794 Long term (current) use of insulin: Secondary | ICD-10-CM | POA: Diagnosis not present

## 2023-04-13 DIAGNOSIS — E1142 Type 2 diabetes mellitus with diabetic polyneuropathy: Secondary | ICD-10-CM | POA: Diagnosis not present

## 2023-04-13 DIAGNOSIS — E782 Mixed hyperlipidemia: Secondary | ICD-10-CM | POA: Diagnosis not present

## 2023-04-13 DIAGNOSIS — E538 Deficiency of other specified B group vitamins: Secondary | ICD-10-CM | POA: Diagnosis not present

## 2023-04-21 DIAGNOSIS — J4520 Mild intermittent asthma, uncomplicated: Secondary | ICD-10-CM | POA: Diagnosis not present

## 2023-04-21 DIAGNOSIS — E119 Type 2 diabetes mellitus without complications: Secondary | ICD-10-CM | POA: Diagnosis not present

## 2023-04-21 DIAGNOSIS — E538 Deficiency of other specified B group vitamins: Secondary | ICD-10-CM | POA: Diagnosis not present

## 2023-04-21 DIAGNOSIS — E782 Mixed hyperlipidemia: Secondary | ICD-10-CM | POA: Diagnosis not present

## 2023-04-21 DIAGNOSIS — Z0001 Encounter for general adult medical examination with abnormal findings: Secondary | ICD-10-CM | POA: Diagnosis not present

## 2023-04-21 DIAGNOSIS — Z794 Long term (current) use of insulin: Secondary | ICD-10-CM | POA: Diagnosis not present

## 2023-04-21 DIAGNOSIS — E1142 Type 2 diabetes mellitus with diabetic polyneuropathy: Secondary | ICD-10-CM | POA: Diagnosis not present

## 2023-04-26 DIAGNOSIS — L97522 Non-pressure chronic ulcer of other part of left foot with fat layer exposed: Secondary | ICD-10-CM | POA: Diagnosis not present

## 2023-05-22 ENCOUNTER — Other Ambulatory Visit: Payer: Self-pay | Admitting: Urology

## 2023-05-22 DIAGNOSIS — N529 Male erectile dysfunction, unspecified: Secondary | ICD-10-CM

## 2023-05-23 ENCOUNTER — Ambulatory Visit: Admitting: Podiatry

## 2023-05-23 ENCOUNTER — Other Ambulatory Visit: Payer: Self-pay | Admitting: Urology

## 2023-05-23 ENCOUNTER — Telehealth: Payer: Self-pay | Admitting: Urology

## 2023-05-23 DIAGNOSIS — N401 Enlarged prostate with lower urinary tract symptoms: Secondary | ICD-10-CM

## 2023-05-23 MED ORDER — SILODOSIN 8 MG PO CAPS: 8.0000 mg | ORAL_CAPSULE | Freq: Every day | ORAL | 11 refills | Status: DC

## 2023-05-23 NOTE — Telephone Encounter (Signed)
 Pt called about getting a refill on silodosin  (RAPAFLO ) 8 MG CAPS capsule [[id number]] pt states that he is out. Pt has follow up apt with Stoneking on May 29. Please advise.

## 2023-05-25 ENCOUNTER — Encounter: Payer: Self-pay | Admitting: Podiatry

## 2023-05-25 ENCOUNTER — Ambulatory Visit (INDEPENDENT_AMBULATORY_CARE_PROVIDER_SITE_OTHER): Admitting: Podiatry

## 2023-05-25 ENCOUNTER — Other Ambulatory Visit: Payer: Self-pay

## 2023-05-25 ENCOUNTER — Ambulatory Visit: Admitting: Podiatry

## 2023-05-25 DIAGNOSIS — L97521 Non-pressure chronic ulcer of other part of left foot limited to breakdown of skin: Secondary | ICD-10-CM

## 2023-05-25 DIAGNOSIS — L97502 Non-pressure chronic ulcer of other part of unspecified foot with fat layer exposed: Secondary | ICD-10-CM | POA: Diagnosis not present

## 2023-05-25 DIAGNOSIS — E119 Type 2 diabetes mellitus without complications: Secondary | ICD-10-CM | POA: Diagnosis not present

## 2023-05-25 DIAGNOSIS — E538 Deficiency of other specified B group vitamins: Secondary | ICD-10-CM | POA: Diagnosis not present

## 2023-05-25 DIAGNOSIS — Z794 Long term (current) use of insulin: Secondary | ICD-10-CM | POA: Diagnosis not present

## 2023-05-25 DIAGNOSIS — E1142 Type 2 diabetes mellitus with diabetic polyneuropathy: Secondary | ICD-10-CM | POA: Diagnosis not present

## 2023-05-25 DIAGNOSIS — L97512 Non-pressure chronic ulcer of other part of right foot with fat layer exposed: Secondary | ICD-10-CM

## 2023-05-25 DIAGNOSIS — E782 Mixed hyperlipidemia: Secondary | ICD-10-CM | POA: Diagnosis not present

## 2023-05-25 DIAGNOSIS — J4520 Mild intermittent asthma, uncomplicated: Secondary | ICD-10-CM | POA: Diagnosis not present

## 2023-05-25 MED ORDER — MUPIROCIN 2 % EX OINT: 1.0000 | TOPICAL_OINTMENT | Freq: Two times a day (BID) | CUTANEOUS | 0 refills | Status: DC

## 2023-05-25 NOTE — Progress Notes (Signed)
 Physical Exam:  Patient alert and oriented x 3.  No complaints of nausea, vomiting, fever, or chills  Vascular: DP pulses diminished bilaterally. PT pulses palpable bilaterally.  Moderate edema. Capillary fill time immediate.  Dermatologic: Full-thickness ulcer rating in the subcutaneous tissue left foot foot. Measures 4 mm wide x 2 mm long x 3 deep.  Minimal drainage.  No erythema.  Mild exudate.  No undermining.  Hyperkeratotic lesion plantar lateral aspect left foot with no signs of ulceration.  Neurologic: Mild paresthesia bilateral  Musculoskeletal: Met adductus foot type left  Radiographs: None  Diagnoses: Deep full-thickness ulcer penetrating into the subcutaneous tissue ulcer foot left . Plan: -Ulcer healing well with signs of infection. -Sharply debrided any devitalized but devitalized tissue from the ulcer left foot.  Debrided in the subcutaneous tissue.  Applies triple antibiotic ointment and a light dressing. - Wound care: Soak in warm Epsom salt water twice daily for 15 minutes.  Apply Bactroban ointment and a gauze dressing. - Refill Rx Bactroban ointment applied to wound twice daily 3 refills -Discussed Lamisil for onychomycosis.  Once the ulcer is healed we can get him started on this.    Return to weeks f/u ulcer

## 2023-05-25 NOTE — Addendum Note (Signed)
 Addended by: Theresia Flasher on: 05/25/2023 04:02 PM   Modules accepted: Orders

## 2023-06-16 ENCOUNTER — Ambulatory Visit (INDEPENDENT_AMBULATORY_CARE_PROVIDER_SITE_OTHER): Admitting: Urology

## 2023-06-16 ENCOUNTER — Encounter: Payer: Self-pay | Admitting: Urology

## 2023-06-16 VITALS — BP 129/65 | HR 102 | Ht 73.0 in | Wt 275.0 lb

## 2023-06-16 DIAGNOSIS — N401 Enlarged prostate with lower urinary tract symptoms: Secondary | ICD-10-CM

## 2023-06-16 DIAGNOSIS — Z711 Person with feared health complaint in whom no diagnosis is made: Secondary | ICD-10-CM | POA: Diagnosis not present

## 2023-06-16 DIAGNOSIS — N529 Male erectile dysfunction, unspecified: Secondary | ICD-10-CM | POA: Diagnosis not present

## 2023-06-16 DIAGNOSIS — B3749 Other urogenital candidiasis: Secondary | ICD-10-CM | POA: Diagnosis not present

## 2023-06-16 DIAGNOSIS — N138 Other obstructive and reflux uropathy: Secondary | ICD-10-CM | POA: Diagnosis not present

## 2023-06-16 LAB — MICROSCOPIC EXAMINATION

## 2023-06-16 LAB — URINALYSIS, ROUTINE W REFLEX MICROSCOPIC
Bilirubin, UA: NEGATIVE
Glucose, UA: NEGATIVE
Leukocytes,UA: NEGATIVE
Nitrite, UA: NEGATIVE
RBC, UA: NEGATIVE
Specific Gravity, UA: 1.025 (ref 1.005–1.030)
Urobilinogen, Ur: 2 mg/dL — ABNORMAL HIGH (ref 0.2–1.0)
pH, UA: 5.5 (ref 5.0–7.5)

## 2023-06-16 MED ORDER — TADALAFIL 20 MG PO TABS: 20.0000 mg | ORAL_TABLET | Freq: Every day | ORAL | 11 refills | Status: DC | PRN

## 2023-06-16 MED ORDER — MICONAZOLE NITRATE 2 % EX CREA: 1.0000 | TOPICAL_CREAM | Freq: Two times a day (BID) | CUTANEOUS | 2 refills | Status: DC

## 2023-06-16 NOTE — Progress Notes (Signed)
 Assessment: 1. BPH with obstruction/lower urinary tract symptoms   2. Organic impotence   3. Concern about sexually transmitted disease in male without diagnosis   4. Candidal balano-posthitis     Plan: Urine test sent for STD screening per patient request - will contact him with results. Continue tadalafil  20 mg prn.  Refill sent. Rx for miconazole for balanitis sent Return to office in 6 months  Chief Complaint:  Chief Complaint  Patient presents with   Benign Prostatic Hypertrophy    History of Present Illness:  Lee Reid is a 68 y.o. male who is seen for continued evaluation of BPH with LUTS and ED.  At his initial visit in June 2024, he reported onset of dysuria about 2 months prior.  He was treated with antibiotics with some improvement in symptoms.  His symptoms worsened after finishing antibiotics.  He was seen in the ER on 04/27/22 for evaluation of back pain and leg pain.  Urinalysis showed 6-10 WBC, 6-10 RBC, and few bacteria.  WBC 6.4K.  CT imaging showed normal kidneys, no stones or obstruction, bladder wall thickening, and enlarged prostate.  He was again treated with antibiotics and symptoms improved.  At the time of his visit in June 2024, he reported mild dysuria, nocturia 2-4x.   His urinalysis showed evidence of trichomonas.  He was treated with Flagyl  2 g x 1. He was also given a trial of alfuzosin  10 mg daily for his BPH with lower urinary tract symptoms. PVR = 0 ml in 6/24  At his visit in July 2024, he reported that the dysuria had completely resolved.  He never started taking the alfuzosin .  He continued to have lower urinary tract symptoms including sensation of incomplete emptying, intermittent stream, decreased stream, frequency and urgency.  He also had nocturia x 4. IPSS = 31. He also reported difficulty achieving and maintaining his erections.  He was using sildenafil 100 mg as needed.  He did not feel like this was working as well for him as it  once did.  He was given a trial of tadalafil  20 mg as needed.  At his visit in 9/24, he continued on alfuzosin .  He reported problems with urgency and urge incontinence.  He also reported slight dysuria.   He was concerned about STDs.  He reported a monogamous relationship with his fiance. He was using tadalafil  20 mg as needed with some improvement in his erections. 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  He was treated with doxycycline  x 7 days.  At his visit in 10/24, he continued on alfuzosin .  He continued to feel like he was not able to empty his bladder completely.  He was also having some slight dysuria.  He remained concerned about the possibility of STDs.  No gross hematuria or flank pain.  No urethral discharge. He was using tadalafil  20 mg as needed for erectile dysfunction. PVR = 85 ml. PSA 1.5 STD panel negative He was started on silodosin  8 mg daily in place of alfuzosin .  He had not noted any improvement in his symptoms with silodosin .  He continued to feel like his bladder does not empty and has postvoid dribbling.  He also had hesitancy and straining.  No dysuria.  Prostate volume measures approximately 109.9 cm on the CT study from 4/24.  He continued to have symptoms of incomplete emptying, intermittent stream, hesitancy, and straining.  He discontinued the silodosin . He continued to have difficulty achieving and maintaining erections despite use  of tadalafil .  He was interested in discussing other options. Cystoscopy from 1/25 showed lateral lobe enlargement of the prostate, median lobe, bladder trabeculations. Treatment options for the BPH with LUTS discussed including HoLEP or robotic assisted simple prostatectomy. He was given a prescription for VED.  He returns today for follow-up.  He has not resumed the silodosin .  He continues to take tadalafil  20 mg as needed.  He continues to have lower urinary tract symptoms including incomplete emptying, frequency,  urgency, intermittent stream.  He does report some mild discomfort with voiding.  He is concerned about an STI as he recently engaged in oral sex.  He reports that his symptoms worsened shortly after this.  No gross hematuria or flank pain. IPSS = 23/4.  Portions of the above documentation were copied from a prior visit for review purposes only.   Past Medical History:  Past Medical History:  Diagnosis Date   Anginal pain (HCC)    Anxiety    Asthma    Chronic back pain    Depression    Diabetes mellitus without complication (HCC)    Dysrhythmia    irregular heartbeat due to stress   GERD (gastroesophageal reflux disease)    Heart murmur    born with heart murmur   Pneumonia     Past Surgical History:  Past Surgical History:  Procedure Laterality Date   CHOLECYSTECTOMY N/A 09/16/2014   Procedure: LAPAROSCOPIC CHOLECYSTECTOMY ;  Surgeon: Alphonso Aschoff Kinsinger, MD;  Location: [facility];  Service: General;  Laterality: N/A;   EYE SURGERY     TESTICLE SURGERY     growth removed   TONSILLECTOMY      Allergies:  Allergies  Allergen Reactions   Bee Venom     Family History:  Family History  Problem Relation Age of Onset   Hypertension Mother    Heart disease Father    Cancer Father     Social History:  Social History   Tobacco Use   Smoking status: Every Day    Current packs/day: 0.25    Types: Cigarettes   Smokeless tobacco: Never  Vaping Use   Vaping status: Former  Substance Use Topics   Alcohol use: Not Currently    Comment: occ   Drug use: No   ROS: Constitutional:  Negative for fever, chills, weight loss CV: Negative for chest pain, previous MI, hypertension Respiratory:  Negative for shortness of breath, wheezing, sleep apnea, frequent cough GI:  Negative for nausea, vomiting, bloody stool, GERD  Physical exam: BP 129/65   Pulse (!) 102   Ht 6\' 1"  (1.854 m)   Wt 275 lb (124.7 kg)   BMI 36.28 kg/m  GENERAL APPEARANCE:  Well appearing, well  developed, well nourished, NAD HEENT:  Atraumatic, normocephalic, oropharynx clear NECK:  Supple without lymphadenopathy or thyromegaly ABDOMEN:  Soft, non-tender, no masses EXTREMITIES:  Moves all extremities well, without clubbing, cyanosis, or edema NEUROLOGIC:  Alert and oriented x 3, normal gait, CN II-XII grossly intact MENTAL STATUS:  appropriate BACK:  Non-tender to palpation, No CVAT SKIN:  Warm, dry, and intact GU: Penis:  uncircumcised; erythema of foreskin and glans c/w candida balanitis Meatus: Normal Scrotum: normal, no masses Testis: normal without masses bilateral    Results: U/A: 0-5 WBCs, 0-2 RBCs

## 2023-06-19 LAB — CT, NG, MYCOPLASMAS NAA, URINE
Chlamydia trachomatis, NAA: NEGATIVE
Mycoplasma genitalium NAA: NEGATIVE
Mycoplasma hominis NAA: NEGATIVE
Neisseria gonorrhoeae, NAA: NEGATIVE
Ureaplasma spp NAA: NEGATIVE

## 2023-06-20 ENCOUNTER — Ambulatory Visit: Payer: Self-pay | Admitting: Urology

## 2023-06-29 ENCOUNTER — Ambulatory Visit (INDEPENDENT_AMBULATORY_CARE_PROVIDER_SITE_OTHER): Admitting: Podiatry

## 2023-06-29 ENCOUNTER — Encounter: Payer: Self-pay | Admitting: Podiatry

## 2023-06-29 DIAGNOSIS — M79671 Pain in right foot: Secondary | ICD-10-CM

## 2023-06-29 DIAGNOSIS — M79672 Pain in left foot: Secondary | ICD-10-CM

## 2023-06-29 DIAGNOSIS — L84 Corns and callosities: Secondary | ICD-10-CM

## 2023-06-29 DIAGNOSIS — E1151 Type 2 diabetes mellitus with diabetic peripheral angiopathy without gangrene: Secondary | ICD-10-CM

## 2023-06-29 DIAGNOSIS — B351 Tinea unguium: Secondary | ICD-10-CM

## 2023-06-29 DIAGNOSIS — I739 Peripheral vascular disease, unspecified: Secondary | ICD-10-CM | POA: Diagnosis not present

## 2023-06-29 NOTE — Addendum Note (Signed)
 Addended by: Paula Born on: 06/29/2023 04:06 PM   Modules accepted: Orders

## 2023-06-29 NOTE — Progress Notes (Signed)
 Patient presents for evaluation and treatment of tenderness and some redness around nails feet.  Tenderness around toes with walking and wearing shoes.  complaint painful calluses over the plantar aspect of the left foot forefoot plantar lateral aspect of the foot  Physical exam:  General appearance: Alert, pleasant, and in no acute distress.  Vascular: Pedal pulses: DP 2/4 B/L, PT 0/4 B/L.  Moderate edema lower legs bilaterally  Neurological:  No complaint of burning or numbness in feet  Dermatologic:  Thick hyperkeratotic lesions with preulcerative changes plantar aspect lateral foot left and forefoot left.  Nails thickened, disfigured, discolored 1-5 BL with subungual debris.  Redness and hypertrophic nail folds along nail folds bilaterally but no signs of drainage or infection.  Musculoskeletal:  Severe met adductus deformities feet bilaterally   Diagnosis: 1. Painful onychomycotic nails 1 through 5 bilaterally. 2. Pain toes 1 through 5 bilaterally. 3.  Painful calluses with preulcerative changes plantar left foot x 2 4.  Type 2 diabetes melitis with PVD 5.  PVD Q8 modifier  Plan: -Debrided calluses x 2 to left foot. -Debrided onychomycotic nails 1 through 5 bilaterally. -Will get her appointment with [facility] for diabetic shoes and custom insoles.  This would be a better option for him with the preulcerative changes and history of ulceration on the left foot.  I do not think surgery would be indicated in his case would not have much benefit for him.  Return 3 months

## 2023-07-09 ENCOUNTER — Emergency Department (HOSPITAL_BASED_OUTPATIENT_CLINIC_OR_DEPARTMENT_OTHER)

## 2023-07-09 ENCOUNTER — Other Ambulatory Visit: Payer: Self-pay

## 2023-07-09 ENCOUNTER — Emergency Department (HOSPITAL_BASED_OUTPATIENT_CLINIC_OR_DEPARTMENT_OTHER): Admission: EM | Admit: 2023-07-09 | Discharge: 2023-07-09 | Disposition: A | Discharge status: Home / Self Care

## 2023-07-09 ENCOUNTER — Encounter (HOSPITAL_BASED_OUTPATIENT_CLINIC_OR_DEPARTMENT_OTHER): Payer: Self-pay | Admitting: Urology

## 2023-07-09 DIAGNOSIS — E1165 Type 2 diabetes mellitus with hyperglycemia: Secondary | ICD-10-CM | POA: Diagnosis not present

## 2023-07-09 DIAGNOSIS — R778 Other specified abnormalities of plasma proteins: Secondary | ICD-10-CM | POA: Diagnosis not present

## 2023-07-09 DIAGNOSIS — R739 Hyperglycemia, unspecified: Secondary | ICD-10-CM

## 2023-07-09 DIAGNOSIS — J45909 Unspecified asthma, uncomplicated: Secondary | ICD-10-CM | POA: Insufficient documentation

## 2023-07-09 DIAGNOSIS — R42 Dizziness and giddiness: Secondary | ICD-10-CM | POA: Diagnosis not present

## 2023-07-09 DIAGNOSIS — R079 Chest pain, unspecified: Secondary | ICD-10-CM | POA: Diagnosis not present

## 2023-07-09 DIAGNOSIS — R7989 Other specified abnormal findings of blood chemistry: Secondary | ICD-10-CM

## 2023-07-09 LAB — CBC WITH DIFFERENTIAL/PLATELET
Abs Immature Granulocytes: 0.02 10*3/uL (ref 0.00–0.07)
Basophils Absolute: 0 10*3/uL (ref 0.0–0.1)
Basophils Relative: 0 %
Eosinophils Absolute: 0.1 10*3/uL (ref 0.0–0.5)
Eosinophils Relative: 1 %
HCT: 43.7 % (ref 39.0–52.0)
Hemoglobin: 15.1 g/dL (ref 13.0–17.0)
Immature Granulocytes: 0 %
Lymphocytes Relative: 38 %
Lymphs Abs: 2.1 10*3/uL (ref 0.7–4.0)
MCH: 26.5 pg (ref 26.0–34.0)
MCHC: 34.6 g/dL (ref 30.0–36.0)
MCV: 76.7 fL — ABNORMAL LOW (ref 80.0–100.0)
Monocytes Absolute: 0.3 10*3/uL (ref 0.1–1.0)
Monocytes Relative: 6 %
Neutro Abs: 3 10*3/uL (ref 1.7–7.7)
Neutrophils Relative %: 55 %
Platelets: 141 10*3/uL — ABNORMAL LOW (ref 150–400)
RBC: 5.7 MIL/uL (ref 4.22–5.81)
RDW: 14 % (ref 11.5–15.5)
WBC: 5.6 10*3/uL (ref 4.0–10.5)
nRBC: 0 % (ref 0.0–0.2)

## 2023-07-09 LAB — URINALYSIS, ROUTINE W REFLEX MICROSCOPIC
Bilirubin Urine: NEGATIVE
Glucose, UA: >=500 mg/dL — AB
Hgb urine dipstick: NEGATIVE
Ketones, ur: NEGATIVE mg/dL
Leukocytes,Ua: NEGATIVE
Nitrite: NEGATIVE
Protein, ur: NEGATIVE mg/dL
Specific Gravity, Urine: 1.01 (ref 1.005–1.030)
pH: 5.5 (ref 5.0–8.0)

## 2023-07-09 LAB — URINALYSIS, MICROSCOPIC (REFLEX): RBC / HPF: NONE SEEN RBC/hpf (ref 0–5)

## 2023-07-09 LAB — COMPREHENSIVE METABOLIC PANEL WITH GFR
ALT: 22 U/L (ref 0–44)
AST: 19 U/L (ref 15–41)
Albumin: 3.9 g/dL (ref 3.5–5.0)
Alkaline Phosphatase: 105 U/L (ref 38–126)
Anion gap: 12 (ref 5–15)
BUN: 19 mg/dL (ref 8–23)
CO2: 23 mmol/L (ref 22–32)
Calcium: 9.3 mg/dL (ref 8.9–10.3)
Chloride: 98 mmol/L (ref 98–111)
Creatinine, Ser: 1.48 mg/dL — ABNORMAL HIGH (ref 0.61–1.24)
GFR, Estimated: 52 mL/min — ABNORMAL LOW (ref >60)
Glucose, Bld: 427 mg/dL — ABNORMAL HIGH (ref 70–99)
Potassium: 4.9 mmol/L (ref 3.5–5.1)
Sodium: 133 mmol/L — ABNORMAL LOW (ref 135–145)
Total Bilirubin: 0.3 mg/dL (ref 0.0–1.2)
Total Protein: 7.1 g/dL (ref 6.5–8.1)

## 2023-07-09 LAB — TROPONIN T, HIGH SENSITIVITY
Troponin T High Sensitivity: 27 ng/L — ABNORMAL HIGH (ref <19)
Troponin T High Sensitivity: 26 ng/L — ABNORMAL HIGH (ref <19)

## 2023-07-09 LAB — CBG MONITORING, ED
Glucose-Capillary: 404 mg/dL — ABNORMAL HIGH (ref 70–99)
Glucose-Capillary: 340 mg/dL — ABNORMAL HIGH (ref 70–99)

## 2023-07-09 LAB — LIPASE, BLOOD: Lipase: 40 U/L (ref 11–51)

## 2023-07-09 MED ORDER — LACTATED RINGERS IV BOLUS
1000.0000 mL | Freq: Once | INTRAVENOUS | Status: AC
  Administered 2023-07-09: 1000 mL via INTRAVENOUS

## 2023-07-09 MED ORDER — BLOOD GLUCOSE MONITORING SUPPL KIT: 1.0000 | PACK | Freq: Once | 0 refills | Status: AC

## 2023-07-09 MED ORDER — GLIPIZIDE ER 5 MG PO TB24: 5.0000 mg | ORAL_TABLET | Freq: Every day | ORAL | Status: DC

## 2023-07-09 MED ORDER — GLIPIZIDE ER 5 MG PO TB24: 5.0000 mg | ORAL_TABLET | Freq: Every day | ORAL | 0 refills | Status: AC

## 2023-07-09 MED ORDER — LACTATED RINGERS IV BOLUS: 1000.0000 mL | Freq: Once | INTRAVENOUS | Status: DC

## 2023-07-09 NOTE — ED Triage Notes (Signed)
 Pt in wheelchair to triage  States feeling sick and dizzy since yesterday States slight headache, not checking blood sugar in a couple weeks  Denies fever

## 2023-07-09 NOTE — ED Provider Notes (Addendum)
 [facility] Provider Note   CSN: [id number] Arrival date & time: 07/09/23  1709     Patient presents with: Dizziness   Lee Reid is a 68 y.o. male.    Dizziness  Patient is a 68 year old male presents ED today with complaints of some epigastric pain, left arm tingling, headache, and fatigue that he has noticed over the last 24 hours.  Previous medical history of diabetes, heart murmur, dysrhythmia, GERD, anxiety, asthma.  Notes that he is also not been taking his blood sugar and has been very inconsistent with his diabetes medications.  Says that he is no longer on insulin  and is not taking his Mounjaro.  Says that these have been progressive however when he came to the ED, notes that all his symptoms have abated.  He is no longer complaining of any symptoms.  Denies fever, vision changes, hearing changes, tinnitus, chest pain, shortness of breath, nausea, vomiting, diarrhea, dysuria, melena, hematochezia, lower leg swelling.  Prior to Admission medications   Medication Sig Start Date End Date Taking? Authorizing Provider  Blood Glucose Monitoring Suppl KIT 1 Device by Does not apply route once for 1 dose. 07/09/23 07/09/23 Yes Keyden Pavlov S, PA-C  Empagliflozin (JARDIANCE PO) Take by mouth.    [provider]  gabapentin  (NEURONTIN ) 300 MG capsule Take 1 capsule (300 mg total) by mouth 3 (three) times daily. 03/27/22 04/26/22  Francesca Elsie LITTIE, MD  glipiZIDE  (GLUCOTROL  XL) 5 MG 24 hr tablet Take 1 tablet (5 mg total) by mouth daily. 07/09/23   Joseangel Nettleton S, PA-C  ibuprofen  (ADVIL ) 600 MG tablet Take 1 tablet (600 mg total) by mouth every 6 (six) hours as needed. Patient not taking: Reported on 06/29/2023 12/07/22   Harris, Abigail, PA-C  LANTUS 100 UNIT/ML injection Inject 10 Units into the skin at bedtime. Patient not taking: Reported on 06/29/2023 06/28/22   [provider]  methocarbamol  (ROBAXIN ) 500 MG tablet  Take 1 tablet (500 mg total) by mouth 3 (three) times daily as needed for muscle spasms. Patient not taking: Reported on 06/29/2023 12/07/22   Harris, Abigail, PA-C  miconazole  (SM ANTIFUNGAL MICONAZOLE ) 2 % cream Apply 1 Application topically 2 (two) times daily. 06/16/23   Stoneking, Adine PARAS., MD  MOUNJARO 2.5 MG/0.5ML Pen Inject into the skin. Patient not taking: Reported on 06/29/2023 06/02/22   [provider]  mupirocin  ointment (BACTROBAN ) 2 % Apply 1 Application topically 2 (two) times daily. Patient not taking: Reported on 06/29/2023 05/25/23   Christine Rush, DPM  oxyCODONE  (ROXICODONE ) 5 MG immediate release tablet Take 1 tablet (5 mg total) by mouth every 4 (four) hours as needed for severe pain (pain score 7-10). Patient not taking: Reported on 06/29/2023 11/22/22   Dreama Longs, MD  silodosin  (RAPAFLO ) 8 MG CAPS capsule Take 1 capsule (8 mg total) by mouth daily with breakfast. Patient not taking: Reported on 06/29/2023 05/23/23   Roseann Adine PARAS., MD  tadalafil  (CIALIS ) 20 MG tablet Take 1 tablet (20 mg total) by mouth daily as needed for erectile dysfunction. Patient not taking: Reported on 06/29/2023 06/16/23   Stoneking, Adine PARAS., MD    Allergies: Bee venom    Review of Systems  Neurological:  Positive for dizziness.  All other systems reviewed and are negative.   Updated Vital Signs BP (!) 103/92   Pulse 80   Temp 97.8 F (36.6 C)   Resp 16   Ht 6' 1 (1.854 m)  Wt 124.7 kg   SpO2 100%   BMI 36.27 kg/m   Physical Exam Vitals and nursing note reviewed.  Constitutional:      General: He is not in acute distress.    Appearance: Normal appearance. He is not ill-appearing or diaphoretic.  HENT:     Head: Normocephalic and atraumatic.   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     Pulses: Normal pulses.     Heart sounds: Normal heart sounds. No murmur heard.    No friction rub. No gallop.  Pulmonary:     Effort: Pulmonary effort is normal. No respiratory distress.     Breath sounds: Normal breath sounds. No stridor. No wheezing, rhonchi or rales.  Chest:     Chest wall: No tenderness.  Abdominal:     General: Abdomen is flat. There is no distension.     Palpations: Abdomen is soft.     Tenderness: There is no abdominal tenderness. There is no right CVA tenderness, left CVA tenderness or guarding.   Musculoskeletal:     Cervical back: No rigidity.     Right lower leg: No edema.     Left lower leg: No edema.   Skin:    General: Skin is warm and dry.     Findings: No bruising or lesion.   Neurological:     General: No focal deficit present.     Mental Status: He is alert and oriented to person, place, and time. Mental status is at baseline.     Sensory: No sensory deficit.     Motor: No weakness.     Gait: Gait normal.     Comments: No facial asymmetry, no ataxia, no apraxia, no aphasia, no arm drift, normal sensation to both upper and lower extremities bilaterally, normal grip strength bilaterally, normal strength to both flexion and extension to both upper lower extremities 5+ bilaterally.   Psychiatric:        Mood and Affect: Mood normal.     (all labs ordered are listed, but only abnormal results are displayed) Labs Reviewed  CBC WITH DIFFERENTIAL/PLATELET - Abnormal; Notable for the following components:      Result Value   MCV 76.7 (*)    Platelets 141 (*)    All other components within normal limits  COMPREHENSIVE METABOLIC PANEL WITH GFR - Abnormal; Notable for the following components:   Sodium 133 (*)    Glucose, Bld 427 (*)    Creatinine, Ser 1.48 (*)    GFR, Estimated 52 (*)    All other components within normal limits  URINALYSIS, ROUTINE W REFLEX MICROSCOPIC - Abnormal; Notable for the following  components:   Glucose, UA >=500 (*)    All other components within normal limits  URINALYSIS, MICROSCOPIC (REFLEX) - Abnormal; Notable for the following components:   Bacteria, UA RARE (*)    All other components within normal limits  CBG MONITORING, ED - Abnormal; Notable for the following components:   Glucose-Capillary 404 (*)    All other components within normal limits  TROPONIN T, HIGH SENSITIVITY - Abnormal; Notable for the following components:   Troponin T High Sensitivity 27 (*)    All other components within normal limits  TROPONIN T,  HIGH SENSITIVITY - Abnormal; Notable for the following components:   Troponin T High Sensitivity 26 (*)    All other components within normal limits  LIPASE, BLOOD    EKG: EKG Interpretation Date/Time:  Saturday July 09 2023 17:44:22 EDT Ventricular Rate:  84 PR Interval:  156 QRS Duration:  144 QT Interval:  381 QTC Calculation: 451 R Axis:   -52  Text Interpretation: Sinus rhythm Right bundle branch block Inferior infarct, old Lateral leads are also involved Confirmed by Ula Barter [phone]) on 07/09/2023 6:02:38 PM  Radiology: DG Chest 2 View Result Date: 07/09/2023 CLINICAL DATA:  Chest pain, lightheadedness EXAM: CHEST - 2 VIEW COMPARISON:  None Available. FINDINGS: Lungs are well expanded, symmetric, and clear. No pneumothorax or pleural effusion. Cardiac size within normal limits. Pulmonary vascularity is normal. Osseous structures are age-appropriate. No acute bone abnormality. IMPRESSION: No active cardiopulmonary disease. Electronically Signed   By: Dorethia Molt M.D.   On: 07/09/2023 19:24     Procedures   Medications Ordered in the ED  glipiZIDE  (GLUCOTROL  XL) 24 hr tablet 5 mg (has no administration in time range)  lactated ringers  bolus 1,000 mL (0 mLs Intravenous Stopped 07/09/23 2005)             HEART Score: 6                    Medical Decision Making Amount and/or Complexity of Data Reviewed Labs:  ordered. Radiology: ordered.  Risk OTC drugs. Prescription drug management.   This patient is a 68 year old male who presents to the ED for concern of dizziness, left arm tingling, epigastric discomfort x 1 day, noting to have had all symptoms gone away since arriving to the ED.  On physical exam, patient is in no acute distress, afebrile, alert and orient x 4, speaking in full sentences, nontachypneic, nontachycardic.,  No murmur noted on exam, RRR, LCTAB, mild epigastric discomfort noted to palpation.  No lower leg edema.  Normal neuroexam.  Unremarkable exam otherwise.  With initial presentation, patient did note to have an elevated blood sugar, saying that he has not taken any his blood sugar medications and has been very inconsistent with them.  Low suspicion for CVA or PE.  However will evaluate for ACS as patient does have story that is mildly suspicious for ACS.  Labs have come back and showing no anion gap, thus no DKA but does show continued elevated blood sugars, will provide liter of fluid and reevaluate.  Initial troponin was elevated with second troponin decreased.  Case was discussed with attending who agreed the patient does not need any more treatment if patient wishes to go home at this time.  Discussed with patient, and he said that he wished to go home at this time as he believes someone else made this bed way more than I do.  He says that he is not having any symptoms at this time.  Will have him continue to do to restart and continue to use his prescribed blood sugar medications, will also have him continue to follow-up with cardiology as he has not been evaluated and with an elevated troponin deemed it would benefit him.  Discussed providing a second liter of fluid to help better regulate his blood sugar however he does not wish to have that at this time.  States that he will continue to drink more water.  Discussed would like him to follow-up with PCP for reevaluation of his  elevated creatinine as  well as to manage his blood sugar.  Patient accepted and agreed.  Upon reevaluation, blood sugar had improved to 340.  Patient vital signs have remained stable throughout the course of patient's time in the ED. Low suspicion for any other emergent pathology at this time. I believe this patient is safe to be discharged. Provided strict return to ER precautions. Patient expressed agreement and understanding of plan. All questions were answered.  Differential diagnoses prior to evaluation: The emergent differential diagnosis includes, but is not limited to, ACS, PE, pneumonia, anxiety, muscle strain, hyperglycemia, DKA, arrhythmia, AAS, CVA,. This is not an exhaustive differential.   Past Medical History / Co-morbidities / Social History: diabetes, heart murmur, dysrhythmia, GERD, anxiety, asthma  Additional history: Chart reviewed. Pertinent results include:   Last podiatry on 06/29/2023 for 2 diabetes with atherosclerosis of extremities.  Lab Tests/Imaging studies: I personally interpreted labs/imaging and the pertinent results include:    CBC unremarkable CMP shows a mild hyponatremia 133, elevated glucose of 427., elevated creatinine 1.48 and decreased GFR 52 labs negative AKI. UA does note glucose, CMP does note an elevated blood sugar of 427.,  Also noting a decrease GFR 42 and elevated creatinine 1.48 decreased from normal.  Troponin was initially 27 with delta troponin being 26 Chest x-ray unremarkable   I agree with the radiologist interpretation.  Cardiac monitoring: EKG obtained and interpreted by myself and attending physician which shows: Sinus rhythm with right bundle branch block  EKG Interpretation Date/Time:  Saturday July 09 2023 17:44:22 EDT Ventricular Rate:  84 PR Interval:  156 QRS Duration:  144 QT Interval:  381 QTC Calculation: 451 R Axis:   -52  Text Interpretation: Sinus rhythm Right bundle branch block Inferior infarct, old  Lateral leads are also involved Confirmed by Ula Barter [phone]) on 07/09/2023 6:02:38 PM          Medications: I ordered medication including LR.  I have reviewed the patients home medicines and have made adjustments as needed.  Critical Interventions: none  Social Determinants of Health: Patient notably has good follow-up with PCP, but has not been evaluated by cardiology, will send him in for cardiology visit  Disposition: After consideration of the diagnostic results and the patients response to treatment, I feel that the patient would benefit from discharge treatment as above.   emergency department workup does not suggest an emergent condition requiring admission or immediate intervention beyond what has been performed at this time. The plan is: Follow-up with cardiology, return to ED for any new or worsening symptoms, start and be consistent with diabetes medication already prescribed, follow-up with PCP for lab recheck, follow-up with cardiology. The patient is safe for discharge and has been instructed to return immediately for worsening symptoms, change in symptoms or any other concerns.   Final diagnoses:  Hyperglycemia  Elevated troponin    ED Discharge Orders          Ordered    glipiZIDE  (GLUCOTROL  XL) 5 MG 24 hr tablet  Daily        07/09/23 2129    Blood Glucose Monitoring Suppl KIT   Once        07/09/23 2129    Ambulatory referral to Cardiology       Comments: If you have not heard from the Cardiology office within the next 72 hours please call [phone].   07/09/23 2130               Beola Terrall RAMAN, PA-C 07/09/23 2142  Mckenzie Toruno S, PA-C 07/09/23 2152    Ula Prentice SAUNDERS, MD 07/09/23 [phone]

## 2023-07-09 NOTE — Discharge Instructions (Addendum)
 You were seen today for elevated blood sugar and dizziness, suspect that the elevated blood sugar was because of dizziness today.  Your imaging and labs today were reassuring that I have low suspicion for any emergent causes of your symptoms.  However am going to refer you to cardiology due to you having a cardiac history and elevated troponin today with no prior evaluation.  I have sent in a referral to cardiology, they should reach out to you within the next 24 to 48 hours however if they do not, call the number that is attached to his AVS to schedule appointment.  Also recommend you continue to follow-up with your primary care as you will need to meet with him for prescription of your glipizide  and for continuing to monitor your blood sugars/diabetes management.  I would also like to have you have your kidney function reevaluated 1 week to ensure that it is improving.  Please return to the ED if you can to have any new or worsening symptoms which would include chest pain, shortness of breath, persistent vomiting, blood in stool, confusion, one-sided weakness, decreased urination despite adequate water intake.

## 2023-08-24 DIAGNOSIS — J4520 Mild intermittent asthma, uncomplicated: Secondary | ICD-10-CM | POA: Diagnosis not present

## 2023-08-24 DIAGNOSIS — E538 Deficiency of other specified B group vitamins: Secondary | ICD-10-CM | POA: Diagnosis not present

## 2023-08-24 DIAGNOSIS — E119 Type 2 diabetes mellitus without complications: Secondary | ICD-10-CM | POA: Diagnosis not present

## 2023-08-24 DIAGNOSIS — E782 Mixed hyperlipidemia: Secondary | ICD-10-CM | POA: Diagnosis not present

## 2023-08-24 DIAGNOSIS — Z794 Long term (current) use of insulin: Secondary | ICD-10-CM | POA: Diagnosis not present

## 2023-08-24 DIAGNOSIS — E1142 Type 2 diabetes mellitus with diabetic polyneuropathy: Secondary | ICD-10-CM | POA: Diagnosis not present

## 2023-09-03 ENCOUNTER — Encounter (HOSPITAL_BASED_OUTPATIENT_CLINIC_OR_DEPARTMENT_OTHER): Payer: Self-pay | Admitting: Emergency Medicine

## 2023-09-03 ENCOUNTER — Emergency Department (HOSPITAL_BASED_OUTPATIENT_CLINIC_OR_DEPARTMENT_OTHER)
Admission: EM | Admit: 2023-09-03 | Discharge: 2023-09-03 | Disposition: A | Discharge status: Home / Self Care | Attending: Emergency Medicine | Admitting: Emergency Medicine

## 2023-09-03 ENCOUNTER — Other Ambulatory Visit: Payer: Self-pay

## 2023-09-03 DIAGNOSIS — E119 Type 2 diabetes mellitus without complications: Secondary | ICD-10-CM | POA: Insufficient documentation

## 2023-09-03 DIAGNOSIS — J45909 Unspecified asthma, uncomplicated: Secondary | ICD-10-CM | POA: Diagnosis not present

## 2023-09-03 DIAGNOSIS — R1011 Right upper quadrant pain: Secondary | ICD-10-CM | POA: Insufficient documentation

## 2023-09-03 DIAGNOSIS — K0889 Other specified disorders of teeth and supporting structures: Secondary | ICD-10-CM | POA: Diagnosis not present

## 2023-09-03 DIAGNOSIS — H9201 Otalgia, right ear: Secondary | ICD-10-CM | POA: Diagnosis not present

## 2023-09-03 LAB — CBC WITH DIFFERENTIAL/PLATELET
Abs Immature Granulocytes: 0.04 K/uL (ref 0.00–0.07)
Basophils Absolute: 0 K/uL (ref 0.0–0.1)
Basophils Relative: 0 %
Eosinophils Absolute: 0.1 K/uL (ref 0.0–0.5)
Eosinophils Relative: 1 %
HCT: 43.8 % (ref 39.0–52.0)
Hemoglobin: 15 g/dL (ref 13.0–17.0)
Immature Granulocytes: 1 %
Lymphocytes Relative: 37 %
Lymphs Abs: 2.7 K/uL (ref 0.7–4.0)
MCH: 26 pg (ref 26.0–34.0)
MCHC: 34.2 g/dL (ref 30.0–36.0)
MCV: 75.8 fL — ABNORMAL LOW (ref 80.0–100.0)
Monocytes Absolute: 0.5 K/uL (ref 0.1–1.0)
Monocytes Relative: 6 %
Neutro Abs: 4 K/uL (ref 1.7–7.7)
Neutrophils Relative %: 55 %
Platelets: 160 K/uL (ref 150–400)
RBC: 5.78 MIL/uL (ref 4.22–5.81)
RDW: 14.3 % (ref 11.5–15.5)
WBC: 7.3 K/uL (ref 4.0–10.5)
nRBC: 0 % (ref 0.0–0.2)

## 2023-09-03 LAB — COMPREHENSIVE METABOLIC PANEL WITH GFR
ALT: 28 U/L (ref 0–44)
AST: 20 U/L (ref 15–41)
Albumin: 4.1 g/dL (ref 3.5–5.0)
Alkaline Phosphatase: 105 U/L (ref 38–126)
Anion gap: 11 (ref 5–15)
BUN: 22 mg/dL (ref 8–23)
CO2: 24 mmol/L (ref 22–32)
Calcium: 9.8 mg/dL (ref 8.9–10.3)
Chloride: 100 mmol/L (ref 98–111)
Creatinine, Ser: 1.35 mg/dL — ABNORMAL HIGH (ref 0.61–1.24)
GFR, Estimated: 58 mL/min — ABNORMAL LOW (ref >60)
Glucose, Bld: 272 mg/dL — ABNORMAL HIGH (ref 70–99)
Potassium: 4.1 mmol/L (ref 3.5–5.1)
Sodium: 135 mmol/L (ref 135–145)
Total Bilirubin: 0.4 mg/dL (ref 0.0–1.2)
Total Protein: 7 g/dL (ref 6.5–8.1)

## 2023-09-03 LAB — LIPASE, BLOOD: Lipase: 24 U/L (ref 11–51)

## 2023-09-03 MED ORDER — CLINDAMYCIN HCL 150 MG PO CAPS: 450.0000 mg | ORAL_CAPSULE | Freq: Three times a day (TID) | ORAL | 0 refills | Status: AC

## 2023-09-03 MED ORDER — SENNOSIDES-DOCUSATE SODIUM 8.6-50 MG PO TABS: 1.0000 | ORAL_TABLET | Freq: Every evening | ORAL | 0 refills | Status: DC | PRN

## 2023-09-03 MED ORDER — OXYCODONE-ACETAMINOPHEN 5-325 MG PO TABS
1.0000 | ORAL_TABLET | Freq: Once | ORAL | Status: AC
  Administered 2023-09-03: 1 via ORAL
  Filled 2023-09-03: qty 1

## 2023-09-03 MED ORDER — SODIUM CHLORIDE 0.9 % IV BOLUS
500.0000 mL | Freq: Once | INTRAVENOUS | Status: AC
  Administered 2023-09-03: 500 mL via INTRAVENOUS

## 2023-09-03 MED ORDER — CLINDAMYCIN HCL 150 MG PO CAPS
450.0000 mg | ORAL_CAPSULE | Freq: Once | ORAL | Status: AC
  Administered 2023-09-03: 450 mg via ORAL
  Filled 2023-09-03: qty 3

## 2023-09-03 MED ORDER — OXYCODONE HCL 5 MG PO TABS: 5.0000 mg | ORAL_TABLET | Freq: Four times a day (QID) | ORAL | 0 refills | Status: DC | PRN

## 2023-09-03 NOTE — ED Provider Notes (Signed)
 Emergency Department Provider Note   I have reviewed the triage vital signs and the nursing notes.   HISTORY  Chief Complaint Dental Pain   HPI Lee Reid is a 68 y.o. male past history reviewed below including diabetes presents the emergency department with dental pain and right upper quadrant abdominal discomfort.  Approximately 1 month prior he had multiple extractions of his upper teeth.  He has had some swelling to the gums and pain radiating into the face.  No trouble breathing or difficulty swallowing.  He is unsure regarding fever.  No shaking chills.  No vomiting.  He called his dentist and was started on amoxicillin which she completed today but pain has returned.   He also states that 2 days ago he developed some right upper quadrant abdominal pain without vomiting.  No radiation into the chest or shortness of breath.  Past Medical History:  Diagnosis Date   Anginal pain (HCC)    Anxiety    Asthma    Chronic back pain    Depression    Diabetes mellitus without complication (HCC)    Dysrhythmia    irregular heartbeat due to stress   GERD (gastroesophageal reflux disease)    Heart murmur    born with heart murmur   Pneumonia     Review of Systems  Constitutional: No fever/chills ENT: No sore throat. Positive dental pain.  Cardiovascular: Denies chest pain. Respiratory: Denies shortness of breath. Gastrointestinal: Positive abdominal pain.  No nausea, no vomiting.   Skin: Negative for rash. Neurological: Negative for headaches, focal weakness or numbness.  ____________________________________________   PHYSICAL EXAM:  VITAL SIGNS: ED Triage Vitals  Encounter Vitals Group     BP 09/03/23 0243 138/78     Pulse Rate 09/03/23 0243 85     Resp 09/03/23 0243 18     Temp 09/03/23 0243 98.1 F (36.7 C)     Temp src --      SpO2 09/03/23 0243 94 %     Weight 09/03/23 0244 280 lb (127 kg)     Height 09/03/23 0244 6' 1 (1.854 m)   Constitutional:  Alert and oriented. Well appearing and in no acute distress. Eyes: Conjunctivae are normal. Head: Atraumatic. Nose: No congestion/rhinnorhea. Mouth/Throat: Mucous membranes are moist.  Diffusely poor dentition with visible abscess.  No trismus.  Clear voice.  Managing oral secretions.  Soft submandibular compartment. Neck: No stridor.   Cardiovascular: Normal rate, regular rhythm. Good peripheral circulation. Grossly normal heart sounds.   Respiratory: Normal respiratory effort.  No retractions. Lungs CTAB. Gastrointestinal: Soft and nontender. No distention.  Musculoskeletal: No gross deformities of extremities. Neurologic:  Normal speech and language.  Skin:  Skin is warm, dry and intact. No rash noted.  ____________________________________________   LABS (all labs ordered are listed, but only abnormal results are displayed)  Labs Reviewed  CBC WITH DIFFERENTIAL/PLATELET - Abnormal; Notable for the following components:      Result Value   MCV 75.8 (*)    All other components within normal limits  COMPREHENSIVE METABOLIC PANEL WITH GFR - Abnormal; Notable for the following components:   Glucose, Bld 272 (*)    Creatinine, Ser 1.35 (*)    GFR, Estimated 58 (*)    All other components within normal limits  LIPASE, BLOOD    ____________________________________________   PROCEDURES  Procedure(s) performed:   Procedures  None  ____________________________________________   INITIAL IMPRESSION / ASSESSMENT AND PLAN / ED COURSE  Pertinent labs &  imaging results that were available during my care of the patient were reviewed by me and considered in my medical decision making (see chart for details).   This patient is Presenting for Evaluation of dental/abd pain, which does require a range of treatment options, and is a complaint that involves a high risk of morbidity and mortality.  The Differential Diagnoses includes but is not exclusive to acute cholecystitis,  intrathoracic causes for epigastric abdominal pain, gastritis, duodenitis, pancreatitis, small bowel or large bowel obstruction, abdominal aortic aneurysm, hernia, gastritis, etc.   Critical Interventions-    Medications  sodium chloride  0.9 % bolus 500 mL (0 mLs Intravenous Stopped 09/03/23 0349)  oxyCODONE -acetaminophen  (PERCOCET/ROXICET) 5-325 MG per tablet 1 tablet (1 tablet Oral Given 09/03/23 0345)  clindamycin  (CLEOCIN ) capsule 450 mg (450 mg Oral Given 09/03/23 0345)    Reassessment after intervention: symptoms improved.   Clinical Laboratory Tests Ordered, included CBC without leukocytosis or anemia.  Hyperglycemia without evidence of DKA.  LFTs, bilirubin, lipase normal.   Cardiac Monitor Tracing which shows NSR.    Social Determinants of Health Risk patient is a smoker.   Medical Decision Making: Summary:  The patient presents the emergency department with dental pain.  I do not see any visible abscess or hard signs to suspect deeper space neck infection.  Do not plan for advanced imaging.  Plan for screening blood work given his right upper quadrant abdominal pain.  No focal tenderness on abdominal exam.  Not plan for abdominal imaging.  Reevaluation with update and discussion with patient.  Discussed lab work.  Plan for clindamycin  and pain medication for home. Plan to follow up with dentist ASAP.   Patient's presentation is most consistent with acute presentation with potential threat to life or bodily function.   Disposition: discharge  ____________________________________________  FINAL CLINICAL IMPRESSION(S) / ED DIAGNOSES  Final diagnoses:  Pain, dental  RUQ pain     NEW OUTPATIENT MEDICATIONS STARTED DURING THIS VISIT:  Discharge Medication List as of 09/03/2023  3:49 AM     START taking these medications   Details  clindamycin  (CLEOCIN ) 150 MG capsule Take 3 capsules (450 mg total) by mouth 3 (three) times daily for 7 days., Starting Sat 09/03/2023, Until  Sat 09/10/2023, Normal    senna-docusate (SENOKOT-S) 8.6-50 MG tablet Take 1 tablet by mouth at bedtime as needed for mild constipation., Starting Sat 09/03/2023, Normal        Note:  This document was prepared using Dragon voice recognition software and may include unintentional dictation errors.  Fonda Law, MD, [facility] Emergency Medicine    Lynsi Dooner, Fonda MATSU, MD 09/03/23 [phone]

## 2023-09-03 NOTE — Discharge Instructions (Signed)
 I am starting on antibiotics with your dental pain and a small number of pain pills.  Please call your dentist on Monday for close follow-up appointment in the coming week.  Return with any new or suddenly worsening symptoms.

## 2023-09-03 NOTE — ED Triage Notes (Signed)
 Pt c/o right sided dental and facial pain that started 3 days ago after a tooth broke off. States that he had dental work done x 1 month ago and still has swelling. Also endorses right sided abdominal pain.

## 2023-09-04 ENCOUNTER — Other Ambulatory Visit: Payer: Self-pay

## 2023-09-04 ENCOUNTER — Encounter (HOSPITAL_BASED_OUTPATIENT_CLINIC_OR_DEPARTMENT_OTHER): Payer: Self-pay | Admitting: Emergency Medicine

## 2023-09-04 ENCOUNTER — Emergency Department (HOSPITAL_BASED_OUTPATIENT_CLINIC_OR_DEPARTMENT_OTHER)
Admission: EM | Admit: 2023-09-04 | Discharge: 2023-09-05 | Disposition: A | Discharge status: Home / Self Care | Attending: Emergency Medicine | Admitting: Emergency Medicine

## 2023-09-04 DIAGNOSIS — E1165 Type 2 diabetes mellitus with hyperglycemia: Secondary | ICD-10-CM | POA: Diagnosis not present

## 2023-09-04 DIAGNOSIS — K0889 Other specified disorders of teeth and supporting structures: Secondary | ICD-10-CM | POA: Insufficient documentation

## 2023-09-04 DIAGNOSIS — Z7984 Long term (current) use of oral hypoglycemic drugs: Secondary | ICD-10-CM | POA: Insufficient documentation

## 2023-09-04 DIAGNOSIS — Z794 Long term (current) use of insulin: Secondary | ICD-10-CM | POA: Diagnosis not present

## 2023-09-04 DIAGNOSIS — R739 Hyperglycemia, unspecified: Secondary | ICD-10-CM

## 2023-09-04 LAB — CBG MONITORING, ED: Glucose-Capillary: 410 mg/dL — ABNORMAL HIGH (ref 70–99)

## 2023-09-04 MED ORDER — SODIUM CHLORIDE 0.9 % IV BOLUS: 1000.0000 mL | Freq: Once | INTRAVENOUS | Status: DC

## 2023-09-04 MED ORDER — INSULIN REGULAR HUMAN 100 UNIT/ML IJ SOLN
8.0000 [IU] | Freq: Once | INTRAMUSCULAR | Status: DC
  Filled 2023-09-04: qty 3

## 2023-09-04 MED ORDER — GLIPIZIDE 5 MG PO TABS
5.0000 mg | ORAL_TABLET | Freq: Once | ORAL | Status: DC
  Filled 2023-09-04: qty 1

## 2023-09-04 NOTE — ED Provider Notes (Signed)
 [place] [facility]  Provider Note  CSN: [id number] Arrival date & time: 09/04/23 2211  History Chief Complaint  Patient presents with   Dental Pain    Lee Reid is a 68 y.o. male with history of DM here for re-evaluation of dental pain, ongoing for several weeks. Has been on Abx several times. Seen in ED 2 days ago and given Rx for clinda and percocet. He reports some improvement but pain has been more severe today and extends through his entire R face. No fever or swelling. He has not had any of his DM medications for 3 days since he's been staying with a friend.    Home Medications Prior to Admission medications   Medication Sig Start Date End Date Taking? Authorizing Provider  clindamycin  (CLEOCIN ) 150 MG capsule Take 3 capsules (450 mg total) by mouth 3 (three) times daily for 7 days. 09/03/23 09/10/23  Long, Joshua G, MD  Empagliflozin (JARDIANCE PO) Take by mouth.    [provider]  gabapentin  (NEURONTIN ) 300 MG capsule Take 1 capsule (300 mg total) by mouth 3 (three) times daily. 03/27/22 04/26/22  Francesca Elsie LITTIE, MD  glipiZIDE  (GLUCOTROL  XL) 5 MG 24 hr tablet Take 1 tablet (5 mg total) by mouth daily. 07/09/23   Bauer, Collin S, PA-C  ibuprofen  (ADVIL ) 600 MG tablet Take 1 tablet (600 mg total) by mouth every 6 (six) hours as needed. Patient not taking: Reported on 06/29/2023 12/07/22   Harris, Abigail, PA-C  LANTUS 100 UNIT/ML injection Inject 10 Units into the skin at bedtime. Patient not taking: Reported on 06/29/2023 06/28/22   [provider]  methocarbamol  (ROBAXIN ) 500 MG tablet Take 1 tablet (500 mg total) by mouth 3 (three) times daily as needed for muscle spasms. Patient not taking: Reported on 06/29/2023 12/07/22   Harris, Abigail, PA-C  miconazole  (SM ANTIFUNGAL MICONAZOLE ) 2 % cream Apply 1 Application topically 2 (two) times daily. 06/16/23   Stoneking, Adine PARAS., MD  MOUNJARO 2.5 MG/0.5ML Pen Inject into  the skin. Patient not taking: Reported on 06/29/2023 06/02/22   [provider]  mupirocin  ointment (BACTROBAN ) 2 % Apply 1 Application topically 2 (two) times daily. Patient not taking: Reported on 06/29/2023 05/25/23   Christine Rush, DPM  oxyCODONE  (ROXICODONE ) 5 MG immediate release tablet Take 1 tablet (5 mg total) by mouth every 6 (six) hours as needed for severe pain (pain score 7-10). 09/03/23   Long, Fonda MATSU, MD  senna-docusate (SENOKOT-S) 8.6-50 MG tablet Take 1 tablet by mouth at bedtime as needed for mild constipation. 09/03/23   Long, Fonda MATSU, MD  silodosin  (RAPAFLO ) 8 MG CAPS capsule Take 1 capsule (8 mg total) by mouth daily with breakfast. Patient not taking: Reported on 06/29/2023 05/23/23   Stoneking, Adine PARAS., MD  tadalafil  (CIALIS ) 20 MG tablet Take 1 tablet (20 mg total) by mouth daily as needed for erectile dysfunction. Patient not taking: Reported on 06/29/2023 06/16/23   Stoneking, Adine PARAS., MD     Allergies    Bee venom   Review of Systems   Review of Systems Please see HPI for pertinent positives and negatives  Physical Exam BP (!) 130/93   Pulse 95   Temp 98.7 F (37.1 C) (Oral)   Resp 17   Ht 6' 1 (1.854 m)   Wt 127 kg   SpO2 96%   BMI 36.94 kg/m   Physical Exam Vitals and nursing note reviewed.  Constitutional:  Appearance: Normal appearance.  HENT:     Head: Normocephalic and atraumatic.     Nose: Nose normal.     Mouth/Throat:     Mouth: Mucous membranes are moist.     Comments: Poor dentition, multiple teeth eroded to the gums in R upper molar area, no erythema, fluctuance or other signs of abscess Eyes:     Extraocular Movements: Extraocular movements intact.     Conjunctiva/sclera: Conjunctivae normal.  Cardiovascular:     Rate and Rhythm: Normal rate.  Pulmonary:     Effort: Pulmonary effort is normal.     Breath sounds: Normal breath sounds.  Abdominal:     General: Abdomen is flat.     Palpations: Abdomen is soft.      Tenderness: There is no abdominal tenderness.  Musculoskeletal:        General: No swelling. Normal range of motion.     Cervical back: Neck supple.  Skin:    General: Skin is warm and dry.  Neurological:     General: No focal deficit present.     Mental Status: He is alert.  Psychiatric:        Mood and Affect: Mood normal.     ED Results / Procedures / Treatments   EKG None  Procedures Procedures  Medications Ordered in the ED Medications - No data to display   Initial Impression and Plan  Patient here for continued toothache, no signs of deep space abscess on exam. Vitals are reassuring. His Glucose is elevated, will check basic labs and given oral meds and IVF.   ED Course   Clinical Course as of 09/05/23 0005  [place] Sep 04, 2023  2351 Oral glipizide  is not available here. Will give SQ insulin .  [CS]  Mon Sep 05, 2023  0004 Patient now states he can get his medications tonight and see his PCP in the morning. He would like to forego labs, IVF or meds tonight. Advised to discuss his dental pain with his dentist.  [CS]    Clinical Course User Index [CS] Roselyn Carlin NOVAK, MD     MDM Rules/Calculators/A&P Medical Decision Making Problems Addressed: Hyperglycemia: chronic illness or injury with exacerbation, progression, or side effects of treatment Toothache: chronic illness or injury  Amount and/or Complexity of Data Reviewed Labs: ordered.  Risk Prescription drug management.     Final Clinical Impression(s) / ED Diagnoses Final diagnoses:  Toothache  Hyperglycemia    Rx / DC Orders ED Discharge Orders     None        Roselyn Carlin NOVAK, MD 09/05/23 0005

## 2023-09-04 NOTE — ED Triage Notes (Signed)
 Pt c/o right sided dental pain with facial swelling x 1 week, pt reports he has been Rx'ed 2 abx, seen here 2 days for same, reports increased pain

## 2023-09-12 DIAGNOSIS — Z794 Long term (current) use of insulin: Secondary | ICD-10-CM | POA: Diagnosis not present

## 2023-09-12 DIAGNOSIS — E782 Mixed hyperlipidemia: Secondary | ICD-10-CM | POA: Diagnosis not present

## 2023-09-12 DIAGNOSIS — E538 Deficiency of other specified B group vitamins: Secondary | ICD-10-CM | POA: Diagnosis not present

## 2023-09-12 DIAGNOSIS — E119 Type 2 diabetes mellitus without complications: Secondary | ICD-10-CM | POA: Diagnosis not present

## 2023-09-12 DIAGNOSIS — E1142 Type 2 diabetes mellitus with diabetic polyneuropathy: Secondary | ICD-10-CM | POA: Diagnosis not present

## 2023-09-12 DIAGNOSIS — J4520 Mild intermittent asthma, uncomplicated: Secondary | ICD-10-CM | POA: Diagnosis not present

## 2023-09-27 ENCOUNTER — Encounter (HOSPITAL_BASED_OUTPATIENT_CLINIC_OR_DEPARTMENT_OTHER): Payer: Self-pay

## 2023-09-27 ENCOUNTER — Other Ambulatory Visit (HOSPITAL_BASED_OUTPATIENT_CLINIC_OR_DEPARTMENT_OTHER): Payer: Self-pay

## 2023-09-27 ENCOUNTER — Other Ambulatory Visit: Payer: Self-pay

## 2023-09-27 ENCOUNTER — Emergency Department (HOSPITAL_BASED_OUTPATIENT_CLINIC_OR_DEPARTMENT_OTHER)
Admission: EM | Admit: 2023-09-27 | Discharge: 2023-09-27 | Disposition: A | Discharge status: Home / Self Care | Attending: Emergency Medicine | Admitting: Emergency Medicine

## 2023-09-27 DIAGNOSIS — M25461 Effusion, right knee: Secondary | ICD-10-CM | POA: Diagnosis not present

## 2023-09-27 DIAGNOSIS — M25561 Pain in right knee: Secondary | ICD-10-CM | POA: Diagnosis present

## 2023-09-27 DIAGNOSIS — M1711 Unilateral primary osteoarthritis, right knee: Secondary | ICD-10-CM | POA: Insufficient documentation

## 2023-09-27 MED ORDER — ACETAMINOPHEN 500 MG PO TABS
500.0000 mg | ORAL_TABLET | Freq: Four times a day (QID) | ORAL | 0 refills | Status: DC | PRN
  Filled 2023-09-27: qty 100, 25d supply, fill #0

## 2023-09-27 MED ORDER — NAPROXEN 375 MG PO TABS
375.0000 mg | ORAL_TABLET | Freq: Two times a day (BID) | ORAL | 0 refills | Status: DC
  Filled 2023-09-27: qty 20, 10d supply, fill #0

## 2023-09-27 NOTE — ED Provider Notes (Signed)
 [place] [facility] Provider Note   CSN: [id number] Arrival date & time: 09/27/23  9260     Patient presents with: Knee Pain   Lee Reid is a 68 y.o. male.   HPI Patient reports he has had right knee pain for over 6 months.  He reports it feels like there is a razor blade between the knee joints at certain times with certain positions and weightbearing.  The severity of this waxes and wanes.  Patient reports that he will get episodes when he is walking and it feels like the knee just catches and gives out and he cannot support his weight.  This has resulted in several falls.  Patient reports he has seen orthopedics in the past and they did not have any specific recommendations and told him there is essentially nothing wrong with it.  Patient reports he has tried multiple types of over-the-counter braces and supports without much relief.  The patient reports that over the past few days now he has had some worsening problems with the knee and gotten some diffuse swelling and more pain above the knee.    Prior to Admission medications   Medication Sig Start Date End Date Taking? Authorizing Provider  acetaminophen  (TYLENOL ) 500 MG tablet Take 1 tablet (500 mg total) by mouth every 6 (six) hours as needed. 09/27/23  Yes Armenta Canning, MD  naproxen  (NAPROSYN ) 375 MG tablet Take 1 tablet (375 mg total) by mouth 2 (two) times daily. 09/27/23  Yes Armenta Canning, MD  Empagliflozin (JARDIANCE PO) Take by mouth.    [provider]  gabapentin  (NEURONTIN ) 300 MG capsule Take 1 capsule (300 mg total) by mouth 3 (three) times daily. 03/27/22 04/26/22  Francesca Elsie LITTIE, MD  glipiZIDE  (GLUCOTROL  XL) 5 MG 24 hr tablet Take 1 tablet (5 mg total) by mouth daily. 07/09/23   Bauer, Collin S, PA-C  ibuprofen  (ADVIL ) 600 MG tablet Take 1 tablet (600 mg total) by mouth every 6 (six) hours as needed. Patient not taking: Reported on 06/29/2023 12/07/22   Harris,  Abigail, PA-C  LANTUS 100 UNIT/ML injection Inject 10 Units into the skin at bedtime. Patient not taking: Reported on 06/29/2023 06/28/22   [provider]  methocarbamol  (ROBAXIN ) 500 MG tablet Take 1 tablet (500 mg total) by mouth 3 (three) times daily as needed for muscle spasms. Patient not taking: Reported on 06/29/2023 12/07/22   Harris, Abigail, PA-C  miconazole  (SM ANTIFUNGAL MICONAZOLE ) 2 % cream Apply 1 Application topically 2 (two) times daily. 06/16/23   Stoneking, Adine PARAS., MD  MOUNJARO 2.5 MG/0.5ML Pen Inject into the skin. Patient not taking: Reported on 06/29/2023 06/02/22   [provider]  mupirocin  ointment (BACTROBAN ) 2 % Apply 1 Application topically 2 (two) times daily. Patient not taking: Reported on 06/29/2023 05/25/23   Christine Rush, DPM  oxyCODONE  (ROXICODONE ) 5 MG immediate release tablet Take 1 tablet (5 mg total) by mouth every 6 (six) hours as needed for severe pain (pain score 7-10). 09/03/23   Long, Fonda MATSU, MD  senna-docusate (SENOKOT-S) 8.6-50 MG tablet Take 1 tablet by mouth at bedtime as needed for mild constipation. 09/03/23   Long, Fonda MATSU, MD  silodosin  (RAPAFLO ) 8 MG CAPS capsule Take 1 capsule (8 mg total) by mouth daily with breakfast. Patient not taking: Reported on 06/29/2023 05/23/23   Roseann Adine PARAS., MD  tadalafil  (CIALIS ) 20 MG tablet Take 1 tablet (20 mg total) by mouth daily as needed for  erectile dysfunction. Patient not taking: Reported on 06/29/2023 06/16/23   Roseann Adine PARAS., MD    Allergies: Bee venom    Review of Systems  Updated Vital Signs BP 115/76 (BP Location: Left Arm)   Pulse 78   Temp 98 F (36.7 C) (Oral)   Resp 18   Ht 6' 1 (1.854 m)   Wt 131.5 kg   SpO2 97%   BMI 38.26 kg/m   Physical Exam Constitutional:      Comments: Alert nontoxic well in appearance.  Pulmonary:     Effort: Pulmonary effort is normal.  Musculoskeletal:     Comments: Both knees are examined side-by-side.  Right knee has  mild suprapatellar effusion that is palpable.  There is no erythema or warmth.  Patient has reproducible pain along the joint line particularly medially.  Some discomfort palpation in the suprapatellar area.  The patella is mobile but has crepitus.  No pain in the popliteal fossa.  Calf is soft and nontender.  No peripheral edema present.  Skin:    General: Skin is warm and dry.  Neurological:     General: No focal deficit present.     Mental Status: He is oriented to person, place, and time.  Psychiatric:        Mood and Affect: Mood normal.     (all labs ordered are listed, but only abnormal results are displayed) Labs Reviewed - No data to display  EKG: None  Radiology: No results found.   Procedures   Medications Ordered in the ED - No data to display                                  Medical Decision Making  Patient presents as outlined.  By history he has chronic knee pain and instability.  I have reviewed prior x-rays and there is note made of tricompartmental mild osteoarthritis.  On physical exam there is subtle effusion of the right knee.  There is no erythema or warmth.  Examining both knees, there is thickness suggestive of osteoarthritis.  We discussed at length the probability of meniscal dysfunction and osteophytes with internal knee derangement.  We discussed potential management strategies with orthopedics.  In light of no acute injury and probable diagnosis, patient and I were in agreement that additional imaging and diagnostic measures in the emergency department were unlikely to change the management.  At this time plan will be to treat conservatively with NSAID and Tylenol  if needed and continued supportive braces, elevating and icing with follow-up with orthopedics.  Patient was agreement with this plan.     Final diagnoses:  Osteoarthritis of right knee, unspecified osteoarthritis type  Effusion of right knee    ED Discharge Orders          Ordered     naproxen  (NAPROSYN ) 375 MG tablet  2 times daily        09/27/23 0841    acetaminophen  (TYLENOL ) 500 MG tablet  Every 6 hours PRN        09/27/23 0841               Armenta Canning, MD 09/27/23 [phone]

## 2023-09-27 NOTE — ED Notes (Signed)
 Discharge instructions reviewed with patient. Patient verbalizes understanding, no further questions at this time. Medications/prescriptions and follow up information provided. No acute distress noted at time of departure.

## 2023-09-27 NOTE — ED Triage Notes (Signed)
 C/o right knee pain and swelling intermittently for a few months, worse the last 3 days. Pain worse with taking steps, states is giving out on him. Denies injury.

## 2023-09-27 NOTE — Discharge Instructions (Signed)
 1.  You have described a chronic knee problem.  This is most likely due to wear and tear of your meniscus and joints with resulting osteoarthritis and dysfunction of the soft tissue pad that supports the weight of the knee.  As this occurs, you can get flareups of pain and swelling.  The swelling in the knee joint itself is called an effusion.  The knee can produce fluid to try to protect itself.  Sometimes this fluid is due to an infection but often it can be a response to inflammation.  In your case this appears likely due to chronic wear-and-tear of the internal structures of the joint and development of additional joint fluid.  This condition can flare up and then improved.  You should elevate your leg is much as possible, and try to wear a supportive brace.  Apply ice. 2.  Ultimately you need to follow-up with an orthopedic specialist.  Chronic knee problems have multiple treatment options that must be recommended and supervised by a joint specialist called an orthopedist. 3.  For your acute pain episode you may take naproxen  twice a day for the next week.  This is an anti-inflammatory medication called a nonsteroidal anti-inflammatory.  This needs to be monitored by your doctor.  These medications can result in problems with your kidney, ulcers and GI bleeding.  For short-term use, these are excellent medications for pain control for this type of problem.  If they are used for long-term, it is important they are carefully monitored.  In addition to taking naproxen , it is safe for you to take a 500 mg dose of Tylenol  every 4-6 hours for additional pain relief.

## 2023-09-28 ENCOUNTER — Ambulatory Visit (INDEPENDENT_AMBULATORY_CARE_PROVIDER_SITE_OTHER): Admitting: Podiatry

## 2023-09-28 ENCOUNTER — Encounter: Payer: Self-pay | Admitting: Podiatry

## 2023-09-28 DIAGNOSIS — L84 Corns and callosities: Secondary | ICD-10-CM | POA: Diagnosis not present

## 2023-09-28 DIAGNOSIS — B351 Tinea unguium: Secondary | ICD-10-CM | POA: Diagnosis not present

## 2023-09-28 DIAGNOSIS — M79672 Pain in left foot: Secondary | ICD-10-CM | POA: Diagnosis not present

## 2023-09-28 DIAGNOSIS — M79671 Pain in right foot: Secondary | ICD-10-CM

## 2023-09-28 NOTE — Progress Notes (Signed)
 Patient presents for evaluation and treatment of tenderness and some redness around nails feet.  Tenderness around toes with walking and wearing shoes.  complaint painful calluses over the plantar aspect of the left foot forefoot and plantar lateral aspect of the foot   Physical exam:   General appearance: Alert, pleasant, and in no acute distress.   Vascular: Pedal pulses: DP 2/4 B/L, PT 0/4 B/L.  Moderate edema lower legs bilaterally   Neurological:  No complaint of burning or numbness in feet   Dermatologic:  Thick hyperkeratotic lesions with preulcerative changes plantar aspect lateral foot left and forefoot left.  Nails thickened, disfigured, discolored 1-5 BL with subungual debris.  Redness and hypertrophic nail folds along nail folds bilaterally but no signs of drainage or infection.   Musculoskeletal:  Severe met adductus deformities feet bilaterally     Diagnosis: 1. Painful onychomycotic nails 1 through 5 bilaterally. 2. Pain toes 1 through 5 bilaterally. 3.  Painful calluses with preulcerative changes plantar left foot x 2 4.  Type 2 diabetes melitis with PVD 5.  PVD Q8 modifier   Plan: -Debrided calluses x 2  left foot. -Debrided onychomycotic nails 1 through 5 bilaterally.    Return 3 months, however he notices signs of ulceration he should call us  immediately.

## 2023-11-12 IMAGING — CT CT T SPINE W/O CM
3 of 6 series · 10 of 33 positions shown, 12 images · non-contrast
Comparison: None.

CLINICAL DATA: Mid-back pain neck pain

EXAM:
CT THORACIC SPINE WITHOUT CONTRAST
TECHNIQUE: Multidetector CT images of the thoracic were obtained using the
standard protocol without intravenous contrast.
RADIATION DOSE REDUCTION: This exam was performed according to the
departmental dose-optimization program which includes automated
exposure control, adjustment of the mA and/or kV according to
patient size and/or use of iterative reconstruction technique.

[Series 4: sagittal bone · sagittal · 0.41mm/px · 5 of 60 slices shown, 6 images]
[im 20/60  bone]
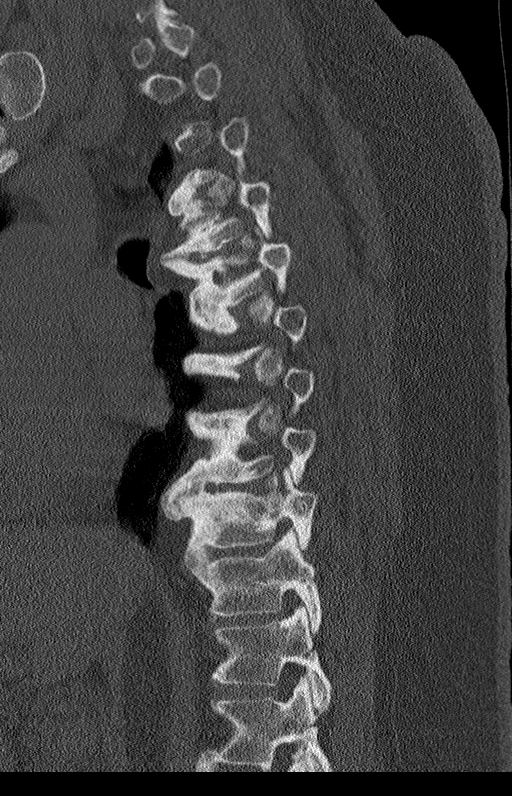
[im 25/60  bone]
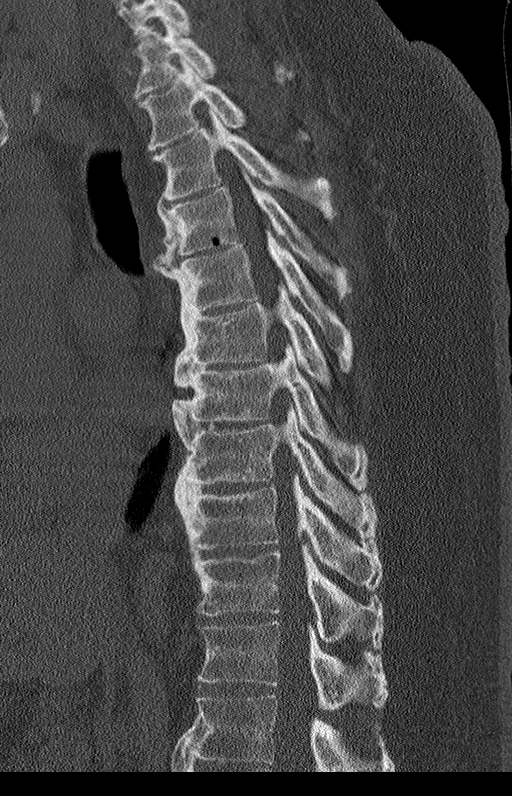
[im 30/60  soft-tissue]
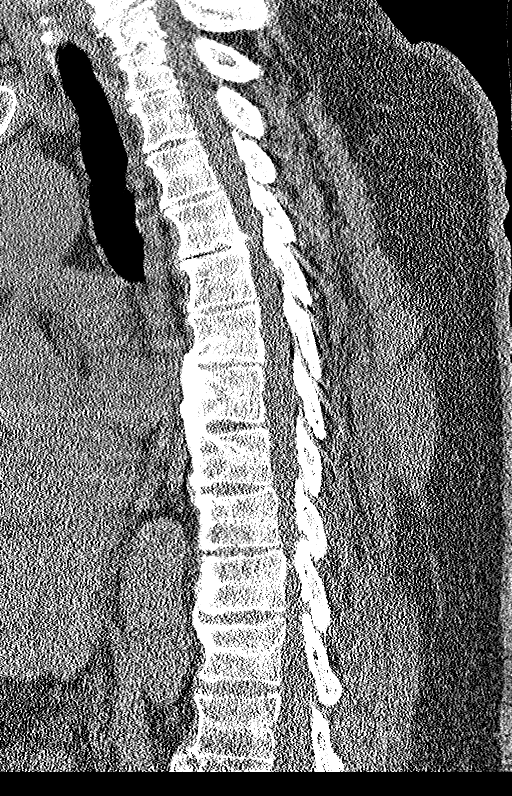
[im 30/60  bone]
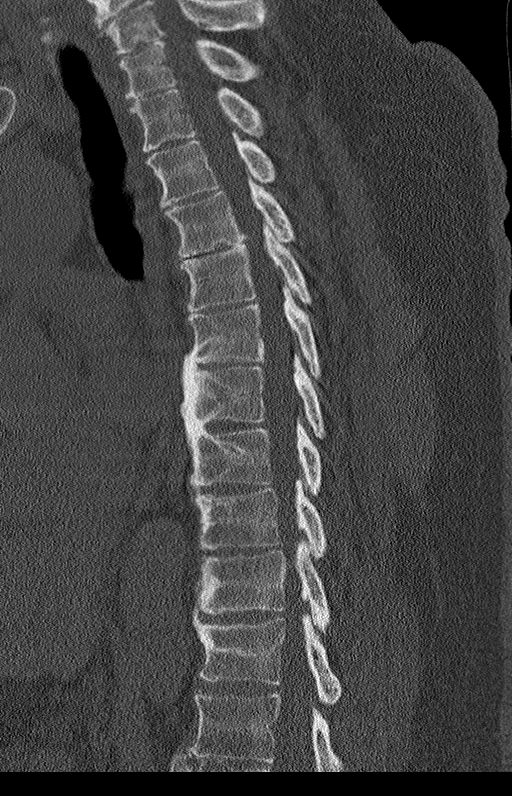
[im 35/60  bone]
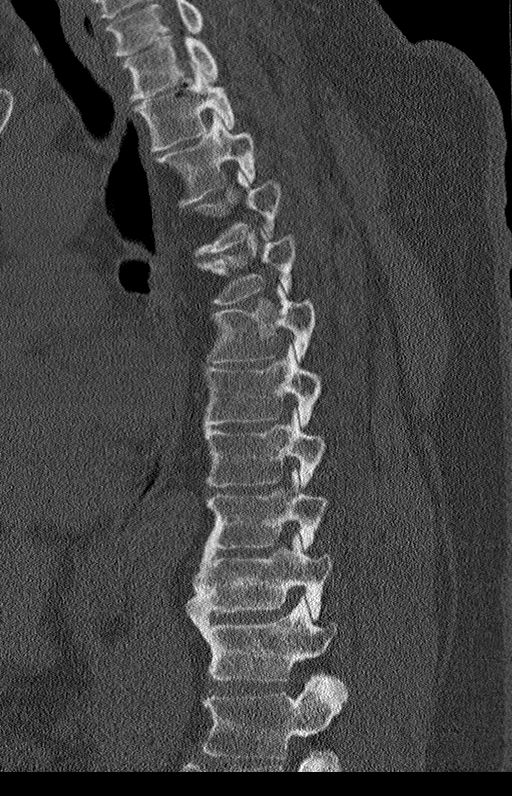
[im 40/60  bone]
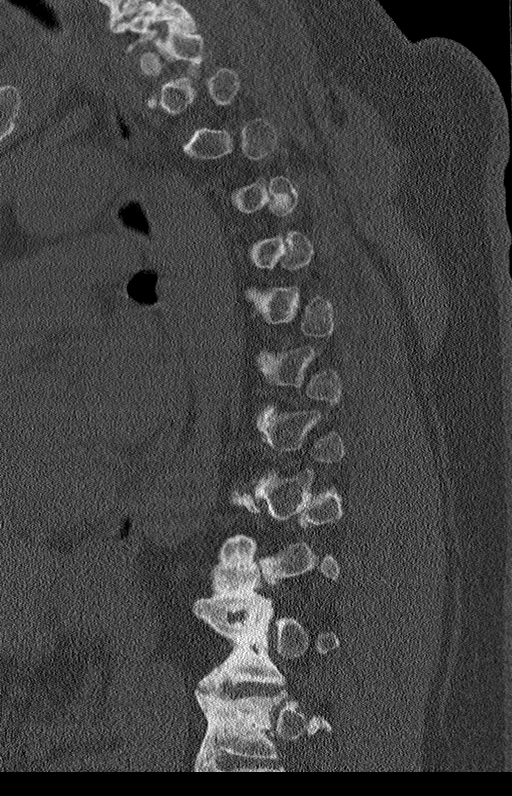

[Series 5: coronal bone · coronal · 0.29mm/px · 3 of 71 slices shown]
[im 15/71  bone]
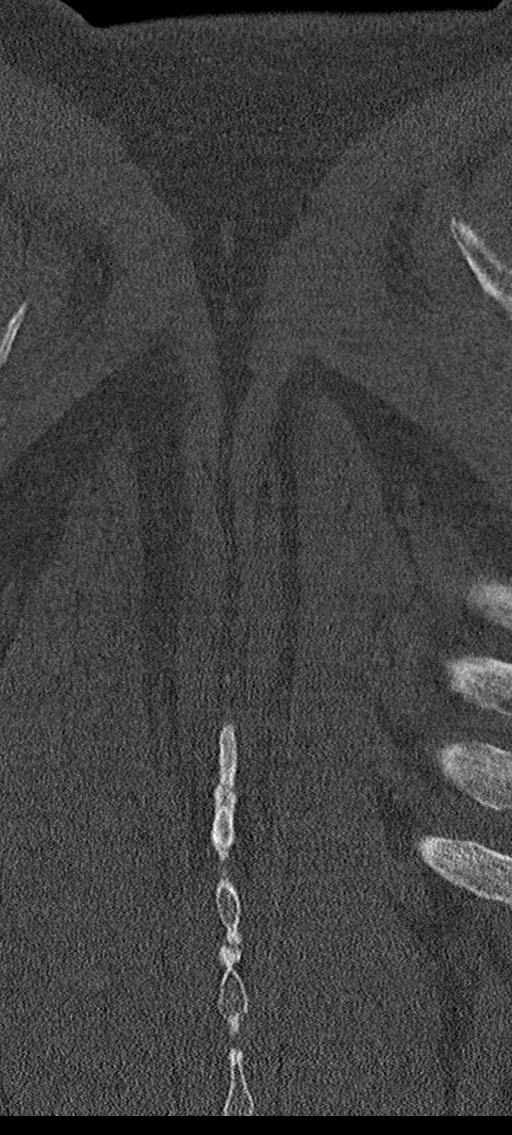
[im 29/71  bone]
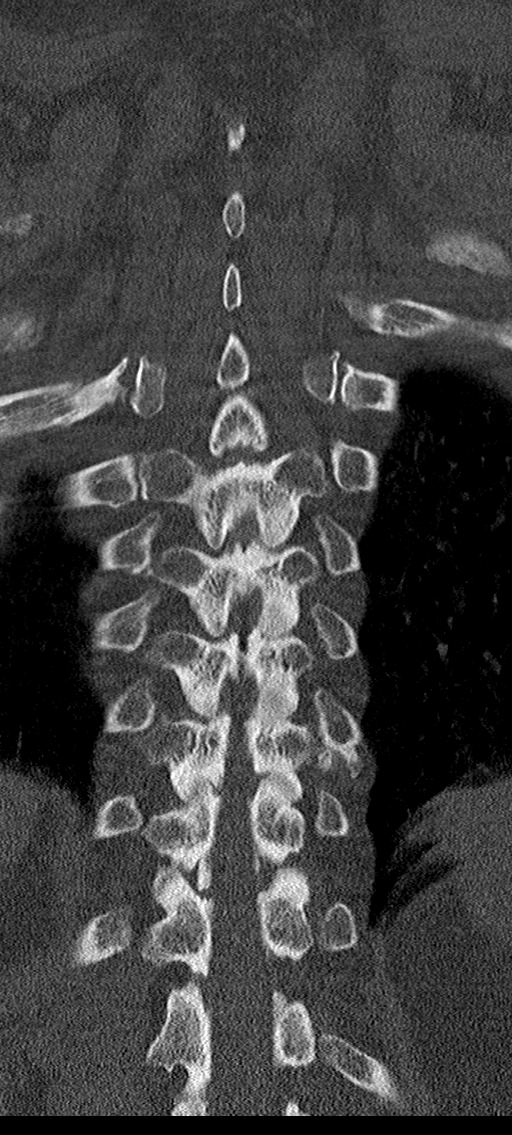
[im 43/71  bone]
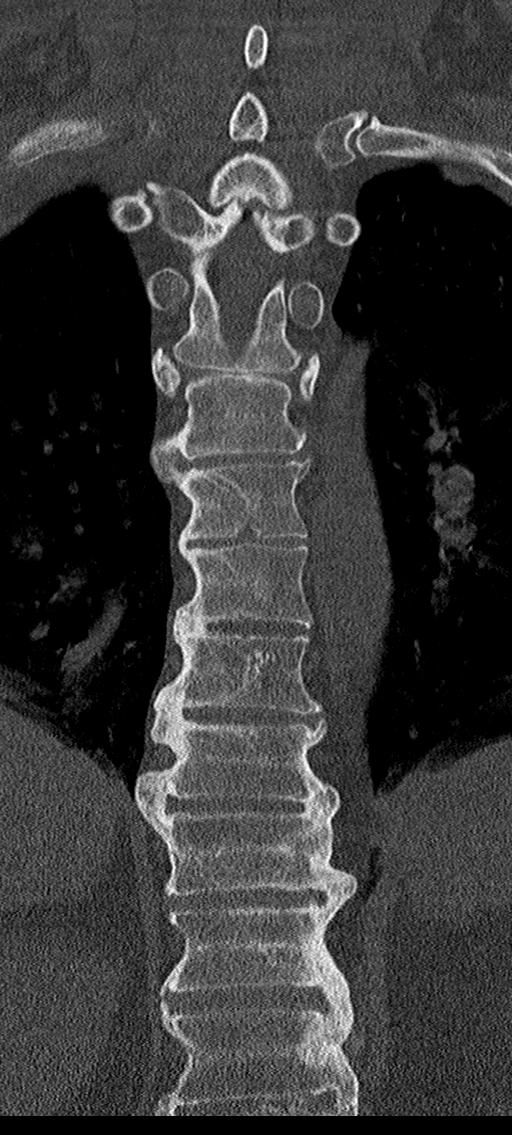

[Series 7: orthogonal axials · axial · 0.27mm/px · z∈[+697,+853]mm · 2 of 156 slices shown, 3 images]
[im 39/156  soft-tissue]
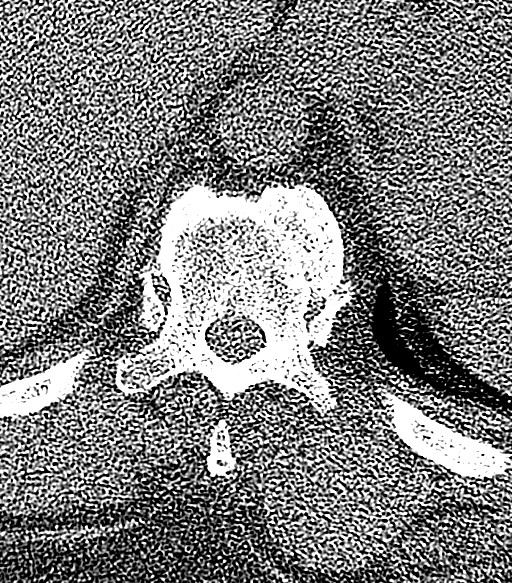
[im 39/156  bone]
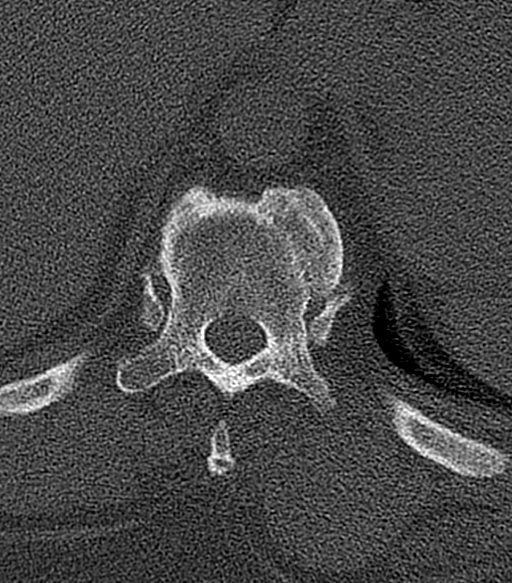
[im 117/156  bone]
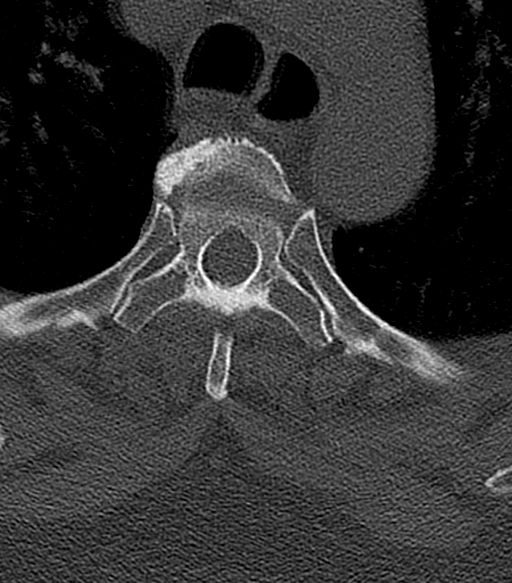

[10 of 33 positions shown; findings below may reference images not displayed]

FINDINGS: Alignment: Mild straightening of the thoracic kyphosis.

Vertebrae: There is no acute thoracic spine fracture. No aggressive
osseous lesion. There is bridging anterolateral osteophyte formation
across multiple levels consistent with diffuse idiopathic skeletal
hyperostosis.

Paraspinal and other soft tissues: Negative

Disc levels: There is mild to moderate multilevel degenerative disc
disease. There is endplate and facet spurring on the left at T4-T5
resulting in mild-to-moderate left-sided neural foraminal stenosis.
IMPRESSION: No acute thoracic spine fracture.

Mild-to-moderate multilevel degenerative changes with bony spurring
resulting in mild to moderate left-sided neural foraminal stenosis
at T4-T5.
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
## 2023-12-12 ENCOUNTER — Ambulatory Visit (INDEPENDENT_AMBULATORY_CARE_PROVIDER_SITE_OTHER): Admitting: Urology

## 2023-12-12 ENCOUNTER — Encounter: Payer: Self-pay | Admitting: Urology

## 2023-12-12 VITALS — BP 120/81 | HR 91 | Ht 73.0 in | Wt 275.0 lb

## 2023-12-12 DIAGNOSIS — R350 Frequency of micturition: Secondary | ICD-10-CM | POA: Diagnosis not present

## 2023-12-12 DIAGNOSIS — R3915 Urgency of urination: Secondary | ICD-10-CM | POA: Diagnosis not present

## 2023-12-12 DIAGNOSIS — N401 Enlarged prostate with lower urinary tract symptoms: Secondary | ICD-10-CM | POA: Diagnosis not present

## 2023-12-12 DIAGNOSIS — R3129 Other microscopic hematuria: Secondary | ICD-10-CM | POA: Insufficient documentation

## 2023-12-12 DIAGNOSIS — N529 Male erectile dysfunction, unspecified: Secondary | ICD-10-CM | POA: Insufficient documentation

## 2023-12-12 DIAGNOSIS — N138 Other obstructive and reflux uropathy: Secondary | ICD-10-CM

## 2023-12-12 LAB — MICROSCOPIC EXAMINATION
Bacteria, UA: NONE SEEN
RBC, Urine: >30 /HPF — AB (ref 0–2)

## 2023-12-12 LAB — URINALYSIS, ROUTINE W REFLEX MICROSCOPIC
Bilirubin, UA: NEGATIVE
Ketones, UA: NEGATIVE
Leukocytes,UA: NEGATIVE
Nitrite, UA: NEGATIVE
Protein,UA: NEGATIVE
Specific Gravity, UA: 1.015 (ref 1.005–1.030)
Urobilinogen, Ur: 0.2 mg/dL (ref 0.2–1.0)
pH, UA: 6 (ref 5.0–7.5)

## 2023-12-12 LAB — BLADDER SCAN AMB NON-IMAGING

## 2023-12-12 MED ORDER — TADALAFIL 20 MG PO TABS: 20.0000 mg | ORAL_TABLET | Freq: Every day | ORAL | 11 refills | Status: AC | PRN

## 2023-12-12 NOTE — Progress Notes (Signed)
 Assessment: 1. BPH with obstruction/lower urinary tract symptoms   2. Microscopic hematuria   3. Organic impotence     Plan: PSA, BMP today I discussed a trial of tadalafil  5 mg daily for his BPH with LUTS and ED.  He does not wish to try this as he states it did not work for him in the past. Continue tadalafil  20 mg as needed.  Refill sent. Today I had a discussion with the patient regarding the findings of microscopic hematuria including the implications and differential diagnoses associated with it.  I also discussed recommendations for further evaluation including the rationale for upper tract imaging and cystoscopy.  I discussed the nature of these procedures including potential risk and complications.  The patient expressed an understanding of these issues. CT hematuria study Hematuria profile sent today Return to office for cystoscopy after CT done  Chief Complaint:  Chief Complaint  Patient presents with   Benign Prostatic Hypertrophy    History of Present Illness:  Lee Reid is a 68 y.o. male who is seen for continued evaluation of BPH with LUTS and ED.  At his initial visit in June 2024, he reported onset of dysuria about 2 months prior.  He was treated with antibiotics with some improvement in symptoms.  His symptoms worsened after finishing antibiotics.  He was seen in the ER on 04/27/22 for evaluation of back pain and leg pain.  Urinalysis showed 6-10 WBC, 6-10 RBC, and few bacteria.  WBC 6.4K.  CT imaging showed normal kidneys, no stones or obstruction, bladder wall thickening, and enlarged prostate.  He was again treated with antibiotics and symptoms improved.  At the time of his visit in June 2024, he reported mild dysuria, nocturia 2-4x.   His urinalysis showed evidence of trichomonas.  He was treated with Flagyl  2 g x 1. He was also given a trial of alfuzosin  10 mg daily for his BPH with lower urinary tract symptoms. PVR = 0 ml in 6/24  At his visit in July  2024, he reported that the dysuria had completely resolved.  He never started taking the alfuzosin .  He continued to have lower urinary tract symptoms including sensation of incomplete emptying, intermittent stream, decreased stream, frequency and urgency.  He also had nocturia x 4. IPSS = 31. He also reported difficulty achieving and maintaining his erections.  He was using sildenafil 100 mg as needed.  He did not feel like this was working as well for him as it once did.  He was given a trial of tadalafil  20 mg as needed.  At his visit in 9/24, he continued on alfuzosin .  He reported problems with urgency and urge incontinence.  He also reported slight dysuria.   He was concerned about STDs.  He reported a monogamous relationship with his fiance. He was using tadalafil  20 mg as needed with some improvement in his erections. 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  He was treated with doxycycline  x 7 days.  At his visit in 10/24, he continued on alfuzosin .  He continued to feel like he was not able to empty his bladder completely.  He was also having some slight dysuria.  He remained concerned about the possibility of STDs.  No gross hematuria or flank pain.  No urethral discharge. He was using tadalafil  20 mg as needed for erectile dysfunction. PVR = 85 ml. PSA 1.5 STD panel negative He was started on silodosin  8 mg daily in place of alfuzosin .  He  had not noted any improvement in his symptoms with silodosin .  He continued to feel like his bladder does not empty and has postvoid dribbling.  He also had hesitancy and straining.  No dysuria.  Prostate volume measures approximately 109.9 cm on the CT study from 4/24.  He continued to have symptoms of incomplete emptying, intermittent stream, hesitancy, and straining.  He discontinued the silodosin . He continued to have difficulty achieving and maintaining erections despite use of tadalafil .  He was interested in discussing other  options. Cystoscopy from 1/25 showed lateral lobe enlargement of the prostate, median lobe, bladder trabeculations. Treatment options for the BPH with LUTS discussed including HoLEP or robotic assisted simple prostatectomy. He was given a prescription for VED.  At his visit in May 2025, he had not resumed the silodosin .  He continued to take tadalafil  20 mg as needed.  He continued to have lower urinary tract symptoms including incomplete emptying, frequency, urgency, intermittent stream.  He reported some mild discomfort with voiding.  He was concerned about an STI due to 2 recent episode of oral sex.  He reported that his symptoms worsened shortly after this.  No gross hematuria or flank pain. IPSS = 23/4. STI panel was negative.  He returns today for follow-up.  He reports some worsening of his lower urinary tract symptoms.  He is having sensation of incomplete emptying, frequency, urgency, intermittent stream, and dribbling prior to urination.  He also has nocturia x 3.  He is not having any dysuria or gross hematuria.  He is no longer taking silodosin . IPSS = 28/5.  Portions of the above documentation were copied from a prior visit for review purposes only.   Past Medical History:  Past Medical History:  Diagnosis Date   Anginal pain    Anxiety    Asthma    Chronic back pain    Depression    Diabetes mellitus without complication (HCC)    Dysrhythmia    irregular heartbeat due to stress   GERD (gastroesophageal reflux disease)    Heart murmur    born with heart murmur   Pneumonia     Past Surgical History:  Past Surgical History:  Procedure Laterality Date   CHOLECYSTECTOMY N/A 09/16/2014   Procedure: LAPAROSCOPIC CHOLECYSTECTOMY ;  Surgeon: Herlene Righter Kinsinger, MD;  Location: [facility];  Service: General;  Laterality: N/A;   EYE SURGERY     TESTICLE SURGERY     growth removed   TONSILLECTOMY      Allergies:  Allergies  Allergen Reactions   Bee Venom     Family  History:  Family History  Problem Relation Age of Onset   Hypertension Mother    Heart disease Father    Cancer Father     Social History:  Social History   Tobacco Use   Smoking status: Every Day    Current packs/day: 0.25    Types: Cigarettes   Smokeless tobacco: Never  Vaping Use   Vaping status: Former  Substance Use Topics   Alcohol use: Not Currently    Comment: occ   Drug use: No   ROS: Constitutional:  Negative for fever, chills, weight loss CV: Negative for chest pain, previous MI, hypertension Respiratory:  Negative for shortness of breath, wheezing, sleep apnea, frequent cough GI:  Negative for nausea, vomiting, bloody stool, GERD  Physical exam: BP 120/81   Pulse 91   Ht 6' 1 (1.854 m)   Wt 275 lb (124.7 kg)   BMI 36.28  kg/m  GENERAL APPEARANCE:  Well appearing, well developed, well nourished, NAD HEENT:  Atraumatic, normocephalic, oropharynx clear NECK:  Supple without lymphadenopathy or thyromegaly ABDOMEN:  Soft, non-tender, no masses EXTREMITIES:  Moves all extremities well, without clubbing, cyanosis, or edema NEUROLOGIC:  Alert and oriented x 3, normal gait, CN II-XII grossly intact MENTAL STATUS:  appropriate BACK:  Non-tender to palpation, No CVAT SKIN:  Warm, dry, and intact GU: Prostate: 60+ g, NT, no nodules Rectum: Normal tone,  no masses or tenderness   Results: U/A: >30 RBC, 0-5 WBC  PVR = 20 ml

## 2023-12-13 ENCOUNTER — Other Ambulatory Visit

## 2023-12-14 LAB — BASIC METABOLIC PANEL WITH GFR
BUN/Creatinine Ratio: 13 (ref 10–24)
BUN: 17 mg/dL (ref 8–27)
CO2: 23 mmol/L (ref 20–29)
Calcium: 9.8 mg/dL (ref 8.6–10.2)
Chloride: 94 mmol/L — ABNORMAL LOW (ref 96–106)
Creatinine, Ser: 1.34 mg/dL — ABNORMAL HIGH (ref 0.76–1.27)
Glucose: 470 mg/dL — ABNORMAL HIGH (ref 70–99)
Potassium: 5.4 mmol/L — ABNORMAL HIGH (ref 3.5–5.2)
Sodium: 137 mmol/L (ref 134–144)
eGFR: 58 mL/min/1.73 — ABNORMAL LOW (ref >59)

## 2023-12-14 LAB — PSA: Prostate Specific Ag, Serum: 1.5 ng/mL (ref 0.0–4.0)

## 2023-12-20 LAB — HEMATURIA PROFILE

## 2023-12-21 ENCOUNTER — Encounter: Payer: Self-pay | Admitting: Urology

## 2023-12-28 ENCOUNTER — Ambulatory Visit: Admitting: Podiatry

## 2023-12-28 ENCOUNTER — Ambulatory Visit (INDEPENDENT_AMBULATORY_CARE_PROVIDER_SITE_OTHER): Admitting: Podiatry

## 2023-12-28 ENCOUNTER — Encounter: Payer: Self-pay | Admitting: Podiatry

## 2023-12-28 DIAGNOSIS — M79671 Pain in right foot: Secondary | ICD-10-CM | POA: Diagnosis not present

## 2023-12-28 DIAGNOSIS — B351 Tinea unguium: Secondary | ICD-10-CM

## 2023-12-28 DIAGNOSIS — L84 Corns and callosities: Secondary | ICD-10-CM

## 2023-12-28 DIAGNOSIS — M79672 Pain in left foot: Secondary | ICD-10-CM

## 2023-12-28 NOTE — Progress Notes (Signed)
 Patient presents for evaluation and treatment of tenderness and some redness around nails feet.  Tenderness around toes with walking and wearing shoes.  complaint painful calluses over the plantar aspect of the left foot forefoot and plantar lateral aspect of the foot   Physical exam:   General appearance: Alert, pleasant, and in no acute distress.   Vascular: Pedal pulses: DP 2/4 B/L, PT 0/4 B/L.  Moderate edema lower legs bilaterally   Neurological:  No complaint of burning or numbness in feet   Dermatologic:  Thick hyperkeratotic lesions with preulcerative changes plantar aspect lateral foot left and forefoot left.  Nails thickened, disfigured, discolored 1-5 BL with subungual debris.  Redness and hypertrophic nail folds along nail folds bilaterally but no signs of drainage or infection.  Skin thin and atrophic with no hair growth lower extremity and areas of hyperpigmentation.   Musculoskeletal:  Severe met adductus deformities feet bilaterally     Diagnosis: 1. Painful onychomycotic nails 1 through 5 bilaterally. 2. Pain toes 1 through 5 bilaterally. 3.  Painful calluses with preulcerative changes plantar left foot x 2 4.  PVD Q8 modifier   Plan: -Debrided calluses x 2  left foot.  Sharply debrided with 312 chisel blade. -Debrided onychomycotic nails 1 through 5 bilaterally.  Sharply debrided with nail clippers and reduced with power bur.     Return 3 months, however he notices signs of ulceration he should call us  immediately.

## 2024-01-05 ENCOUNTER — Ambulatory Visit (HOSPITAL_BASED_OUTPATIENT_CLINIC_OR_DEPARTMENT_OTHER)
Admission: RE | Admit: 2024-01-05 | Discharge: 2024-01-05 | Disposition: A | Source: Ambulatory Visit | Attending: Urology | Admitting: Urology

## 2024-01-05 DIAGNOSIS — R3129 Other microscopic hematuria: Secondary | ICD-10-CM | POA: Insufficient documentation

## 2024-01-05 MED ORDER — IOHEXOL 300 MG/ML  SOLN
100.0000 mL | Freq: Once | INTRAMUSCULAR | Status: AC | PRN
  Administered 2024-01-05: 12:00:00 100 mL via INTRAVENOUS

## 2024-01-09 ENCOUNTER — Ambulatory Visit: Payer: Self-pay | Admitting: Urology

## 2024-01-20 ENCOUNTER — Ambulatory Visit: Admitting: Urology

## 2024-01-20 ENCOUNTER — Encounter: Payer: Self-pay | Admitting: Urology

## 2024-01-20 VITALS — BP 132/82 | HR 81

## 2024-01-20 DIAGNOSIS — N401 Enlarged prostate with lower urinary tract symptoms: Secondary | ICD-10-CM | POA: Diagnosis not present

## 2024-01-20 DIAGNOSIS — R3129 Other microscopic hematuria: Secondary | ICD-10-CM

## 2024-01-20 DIAGNOSIS — N138 Other obstructive and reflux uropathy: Secondary | ICD-10-CM | POA: Diagnosis not present

## 2024-01-20 DIAGNOSIS — R350 Frequency of micturition: Secondary | ICD-10-CM | POA: Diagnosis not present

## 2024-01-20 DIAGNOSIS — R3915 Urgency of urination: Secondary | ICD-10-CM | POA: Diagnosis not present

## 2024-01-20 DIAGNOSIS — N529 Male erectile dysfunction, unspecified: Secondary | ICD-10-CM | POA: Diagnosis not present

## 2024-01-20 LAB — URINALYSIS, ROUTINE W REFLEX MICROSCOPIC
Bilirubin, UA: NEGATIVE
Leukocytes,UA: NEGATIVE
Nitrite, UA: NEGATIVE
Specific Gravity, UA: 1.02 (ref 1.005–1.030)
Urobilinogen, Ur: 0.2 mg/dL (ref 0.2–1.0)
pH, UA: 5 (ref 5.0–7.5)

## 2024-01-20 LAB — MICROSCOPIC EXAMINATION

## 2024-01-20 NOTE — Addendum Note (Signed)
 Addended by: OBADIAH ROSELEE RAMAN on: 01/20/2024 02:24 PM   Modules accepted: Orders

## 2024-01-20 NOTE — Progress Notes (Signed)
 "  Assessment: 1. Microscopic hematuria   2. BPH with obstruction/lower urinary tract symptoms   3. Organic impotence    Plan: I personally reviewed the CT study from 12/25 with results as noted below. Given his significant discomfort with cystoscopy previously and the normal appearing bladder, I am going to send his urine for CX bladder and lieu of repeating cystoscopy. Continue tadalafil  as needed. Return to office in 6 months  Chief Complaint:  Chief Complaint  Patient presents with   Hematuria   Cysto    History of Present Illness:  Lee Reid is a 69 y.o. male who is seen for continued evaluation of BPH with LUTS and ED.  At his initial visit in June 2024, he reported onset of dysuria about 2 months prior.  He was treated with antibiotics with some improvement in symptoms.  His symptoms worsened after finishing antibiotics.  He was seen in the ER on 04/27/22 for evaluation of back pain and leg pain.  Urinalysis showed 6-10 WBC, 6-10 RBC, and few bacteria.  WBC 6.4K.  CT imaging showed normal kidneys, no stones or obstruction, bladder wall thickening, and enlarged prostate.  He was again treated with antibiotics and symptoms improved.  At the time of his visit in June 2024, he reported mild dysuria, nocturia 2-4x.   His urinalysis showed evidence of trichomonas.  He was treated with Flagyl  2 g x 1. He was also given a trial of alfuzosin  10 mg daily for his BPH with lower urinary tract symptoms. PVR = 0 ml in 6/24  At his visit in July 2024, he reported that the dysuria had completely resolved.  He never started taking the alfuzosin .  He continued to have lower urinary tract symptoms including sensation of incomplete emptying, intermittent stream, decreased stream, frequency and urgency.  He also had nocturia x 4. IPSS = 31. He also reported difficulty achieving and maintaining his erections.  He was using sildenafil 100 mg as needed.  He did not feel like this was working as well  for him as it once did.  He was given a trial of tadalafil  20 mg as needed.  At his visit in 9/24, he continued on alfuzosin .  He reported problems with urgency and urge incontinence.  He also reported slight dysuria.   He was concerned about STDs.  He reported a monogamous relationship with his fiance. He was using tadalafil  20 mg as needed with some improvement in his erections. 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  He was treated with doxycycline  x 7 days.  At his visit in 10/24, he continued on alfuzosin .  He continued to feel like he was not able to empty his bladder completely.  He was also having some slight dysuria.  He remained concerned about the possibility of STDs.  No gross hematuria or flank pain.  No urethral discharge. He was using tadalafil  20 mg as needed for erectile dysfunction. PVR = 85 ml. PSA 1.5 STD panel negative He was started on silodosin  8 mg daily in place of alfuzosin .  He had not noted any improvement in his symptoms with silodosin .  He continued to feel like his bladder does not empty and has postvoid dribbling.  He also had hesitancy and straining.  No dysuria.  Prostate volume measures approximately 109.9 cm on the CT study from 4/24.  He continued to have symptoms of incomplete emptying, intermittent stream, hesitancy, and straining.  He discontinued the silodosin . He continued to have difficulty achieving and  maintaining erections despite use of tadalafil .  He was interested in discussing other options. Cystoscopy from 1/25 showed lateral lobe enlargement of the prostate, median lobe, bladder trabeculations. Treatment options for the BPH with LUTS discussed including HoLEP or robotic assisted simple prostatectomy. He was given a prescription for VED.  At his visit in May 2025, he had not resumed the silodosin .  He continued to take tadalafil  20 mg as needed.  He continued to have lower urinary tract symptoms including incomplete emptying, frequency,  urgency, intermittent stream.  He reported some mild discomfort with voiding.  He was concerned about an STI due to 2 recent episode of oral sex.  He reported that his symptoms worsened shortly after this.  No gross hematuria or flank pain. IPSS = 23/4. STI panel was negative.  At his visit in November 2025, he reported some worsening of his lower urinary tract symptoms.  He was having sensation of incomplete emptying, frequency, urgency, intermittent stream, and dribbling prior to urination.  He also had nocturia x 3.  He was not having any dysuria or gross hematuria.  He was no longer taking silodosin . IPSS = 28/5. PVR = 20 mL. Urinalysis showed >30 RBCs. PSA 11/25: 1.5 BMP 11/25: Creatinine 1.34  Hematuria evaluation: CT hematuria study from 01/05/2024 showed no renal or ureteral calculi, no renal mass, no evidence of obstruction or filling defect.  Prostate volume measures 88.87 cm. Hematuria profile 12/12/2023: Negative for malignant cells or dysplasia; isomorphic erythrocytes. Prior cystoscopy from 1/25 did not show any obvious bladder abnormalities.  He returns today for follow-up.  No dysuria or gross hematuria.  He reports that his lower urinary tract symptoms are fairly stable.  He does have occasional discomfort in the suprapubic area with some frequency and urgency.  No flank pain.  He continues on tadalafil  20 mg as needed with good results.  Portions of the above documentation were copied from a prior visit for review purposes only.   Past Medical History:  Past Medical History:  Diagnosis Date   Anginal pain    Anxiety    Asthma    Chronic back pain    Depression    Diabetes mellitus without complication (HCC)    Dysrhythmia    irregular heartbeat due to stress   GERD (gastroesophageal reflux disease)    Heart murmur    born with heart murmur   Pneumonia     Past Surgical History:  Past Surgical History:  Procedure Laterality Date   CHOLECYSTECTOMY N/A  09/16/2014   Procedure: LAPAROSCOPIC CHOLECYSTECTOMY ;  Surgeon: Herlene Righter Kinsinger, MD;  Location: [facility];  Service: General;  Laterality: N/A;   EYE SURGERY     TESTICLE SURGERY     growth removed   TONSILLECTOMY      Allergies:  Allergies  Allergen Reactions   Bee Venom     Family History:  Family History  Problem Relation Age of Onset   Hypertension Mother    Heart disease Father    Cancer Father     Social History:  Social History   Tobacco Use   Smoking status: Every Day    Current packs/day: 0.25    Types: Cigarettes   Smokeless tobacco: Never  Vaping Use   Vaping status: Former  Substance Use Topics   Alcohol use: Not Currently    Comment: occ   Drug use: No   ROS: Constitutional:  Negative for fever, chills, weight loss CV: Negative for chest pain, previous MI, hypertension  Respiratory:  Negative for shortness of breath, wheezing, sleep apnea, frequent cough GI:  Negative for nausea, vomiting, bloody stool, GERD  Physical exam: BP 132/82   Pulse 81  GENERAL APPEARANCE:  Well appearing, well developed, well nourished, NAD HEENT:  Atraumatic, normocephalic, oropharynx clear NECK:  Supple without lymphadenopathy or thyromegaly ABDOMEN:  Soft, non-tender, no masses EXTREMITIES:  Moves all extremities well, without clubbing, cyanosis, or edema NEUROLOGIC:  Alert and oriented x 3, normal gait, CN II-XII grossly intact MENTAL STATUS:  appropriate BACK:  Non-tender to palpation, No CVAT SKIN:  Warm, dry, and intact   Results: U/A: 0-5 WBCs, 0-2 RBCs   "

## 2024-01-27 ENCOUNTER — Encounter: Payer: Self-pay | Admitting: Urology

## 2024-02-09 NOTE — Progress Notes (Signed)
 CYPRESS FANFAN                                          MRN: [id number]   02/09/2024   The [facility] Quality Team Specialist reviewed this patient medical record for the purposes of chart review for care gap closure. The following were reviewed: chart review for care gap closure-glycemic status assessment.    [facility] Quality Team

## 2024-02-22 ENCOUNTER — Other Ambulatory Visit: Payer: Self-pay

## 2024-02-22 ENCOUNTER — Emergency Department (HOSPITAL_BASED_OUTPATIENT_CLINIC_OR_DEPARTMENT_OTHER)

## 2024-02-22 ENCOUNTER — Emergency Department (HOSPITAL_BASED_OUTPATIENT_CLINIC_OR_DEPARTMENT_OTHER)
Admission: EM | Admit: 2024-02-22 | Discharge: 2024-02-22 | Disposition: A | Discharge status: Home / Self Care | Attending: Emergency Medicine | Admitting: Emergency Medicine

## 2024-02-22 DIAGNOSIS — J45909 Unspecified asthma, uncomplicated: Secondary | ICD-10-CM | POA: Insufficient documentation

## 2024-02-22 DIAGNOSIS — X58XXXA Exposure to other specified factors, initial encounter: Secondary | ICD-10-CM | POA: Insufficient documentation

## 2024-02-22 DIAGNOSIS — E119 Type 2 diabetes mellitus without complications: Secondary | ICD-10-CM | POA: Insufficient documentation

## 2024-02-22 DIAGNOSIS — S39012A Strain of muscle, fascia and tendon of lower back, initial encounter: Secondary | ICD-10-CM | POA: Insufficient documentation

## 2024-02-22 LAB — CBC WITH DIFFERENTIAL/PLATELET
Abs Immature Granulocytes: 0.03 10*3/uL (ref 0.00–0.07)
Basophils Absolute: 0 10*3/uL (ref 0.0–0.1)
Basophils Relative: 0 %
Eosinophils Absolute: 0.1 10*3/uL (ref 0.0–0.5)
Eosinophils Relative: 2 %
HCT: 45.8 % (ref 39.0–52.0)
Hemoglobin: 15.8 g/dL (ref 13.0–17.0)
Immature Granulocytes: 1 %
Lymphocytes Relative: 36 %
Lymphs Abs: 2.2 10*3/uL (ref 0.7–4.0)
MCH: 26.2 pg (ref 26.0–34.0)
MCHC: 34.5 g/dL (ref 30.0–36.0)
MCV: 76.1 fL — ABNORMAL LOW (ref 80.0–100.0)
Monocytes Absolute: 0.4 10*3/uL (ref 0.1–1.0)
Monocytes Relative: 6 %
Neutro Abs: 3.5 10*3/uL (ref 1.7–7.7)
Neutrophils Relative %: 55 %
Platelets: 152 10*3/uL (ref 150–400)
RBC: 6.02 MIL/uL — ABNORMAL HIGH (ref 4.22–5.81)
RDW: 13.5 % (ref 11.5–15.5)
WBC: 6.2 10*3/uL (ref 4.0–10.5)
nRBC: 0 % (ref 0.0–0.2)

## 2024-02-22 LAB — BASIC METABOLIC PANEL WITH GFR
Anion gap: 9 (ref 5–15)
BUN: 29 mg/dL — ABNORMAL HIGH (ref 8–23)
CO2: 28 mmol/L (ref 22–32)
Calcium: 9.8 mg/dL (ref 8.9–10.3)
Chloride: 104 mmol/L (ref 98–111)
Creatinine, Ser: 1.31 mg/dL — ABNORMAL HIGH (ref 0.61–1.24)
GFR, Estimated: 60 mL/min — ABNORMAL LOW
Glucose, Bld: 193 mg/dL — ABNORMAL HIGH (ref 70–99)
Potassium: 4.9 mmol/L (ref 3.5–5.1)
Sodium: 141 mmol/L (ref 135–145)

## 2024-02-22 LAB — URINALYSIS, MICROSCOPIC (REFLEX)
Bacteria, UA: NONE SEEN
Squamous Epithelial / HPF: NONE SEEN /HPF (ref 0–5)

## 2024-02-22 LAB — URINALYSIS, ROUTINE W REFLEX MICROSCOPIC
Bilirubin Urine: NEGATIVE
Glucose, UA: >=500 mg/dL — AB
Ketones, ur: NEGATIVE mg/dL
Leukocytes,Ua: NEGATIVE
Nitrite: NEGATIVE
Protein, ur: NEGATIVE mg/dL
Specific Gravity, Urine: 1.01 (ref 1.005–1.030)
pH: 6 (ref 5.0–8.0)

## 2024-02-22 NOTE — ED Triage Notes (Addendum)
 Brought into triage via wheelchair Right lower back pain that radiates into groin x 5 days. Denies urinary symptoms.

## 2024-02-22 NOTE — Discharge Instructions (Signed)
 No worrisome findings on your blood work, or CT scan.  Likely musculoskeletal injury.  Follow-up with your primary care doctor for reevaluation.  Return for any emergent symptoms.

## 2024-02-22 NOTE — ED Provider Notes (Signed)
 " [place] [facility] Provider Note   CSN: [id number] Arrival date & time: 02/22/24  1248     Patient presents with: Back Pain   ESLI Reid is a 69 y.o. male.   69 year old male presents today for concern of left back pain that radiates into his groin.  Denies any dysuria, flank pain, scrotal/testicular pain.  No fever.  His symptoms ongoing for about 5 days.  No prior history of kidney stones.  No chest pain, shortness of breath.  No abdominal pain, nausea, or vomiting.  The history is provided by the patient. No language interpreter was used.       Prior to Admission medications  Medication Sig Start Date End Date Taking? Authorizing Provider  amoxicillin-clavulanate (AUGMENTIN) 875-125 MG tablet Take 1 tablet by mouth 2 (two) times daily. 02/21/24  Yes [provider]  Continuous Glucose Sensor (FREESTYLE LIBRE 3 SENSOR) MISC 1 each by Other route as directed. 02/17/24  Yes [provider]  JARDIANCE 25 MG TABS tablet Take 25 mg by mouth every morning. 01/23/24  Yes [provider]  MOUNJARO 10 MG/0.5ML Pen Inject 10 mg into the skin once a week. 01/23/24  Yes [provider]  albuterol  (VENTOLIN  HFA) 108 (90 Base) MCG/ACT inhaler SMARTSIG:2 Puff(s) By Mouth 4 Times Daily PRN 11/22/23   [provider]  Empagliflozin (JARDIANCE PO) Take by mouth.    [provider]  glipiZIDE  (GLUCOTROL  XL) 5 MG 24 hr tablet Take 1 tablet (5 mg total) by mouth daily. 07/09/23   Bauer, Collin S, PA-C  tadalafil  (CIALIS ) 20 MG tablet Take 1 tablet (20 mg total) by mouth daily as needed for erectile dysfunction. 12/12/23   Stoneking, Adine PARAS., MD    Allergies: Bee venom    Review of Systems  Constitutional:  Negative for chills and fever.  Gastrointestinal:  Negative for abdominal pain, nausea and vomiting.  Genitourinary:  Positive for flank pain. Negative for difficulty urinating.  All other systems  reviewed and are negative.   Updated Vital Signs BP 131/82 (BP Location: Left Arm)   Pulse 89   Temp 97.9 F (36.6 C) (Oral)   Resp 18   SpO2 98%   Physical Exam Vitals and nursing note reviewed.  Constitutional:      General: He is not in acute distress.    Appearance: Normal appearance. He is not ill-appearing.  HENT:     Head: Normocephalic and atraumatic.     Nose: Nose normal.  Eyes:     Conjunctiva/sclera: Conjunctivae normal.  Pulmonary:     Effort: Pulmonary effort is normal. No respiratory distress.  Abdominal:     General: There is no distension.     Palpations: Abdomen is soft.     Tenderness: There is no abdominal tenderness. There is no right CVA tenderness, left CVA tenderness or guarding.  Musculoskeletal:        General: No deformity. Normal range of motion.     Comments: Tenderness to palpation over his left lumbar paraspinal muscles.  No CVA tenderness bilaterally.  Skin:    Findings: No rash.  Neurological:     Mental Status: He is alert.     (all labs ordered are listed, but only abnormal results are displayed) Labs Reviewed  URINALYSIS, ROUTINE W REFLEX MICROSCOPIC - Abnormal; Notable for the following components:      Result Value   Glucose, UA >=500 (*)    Hgb urine dipstick TRACE (*)  All other components within normal limits  CBC WITH DIFFERENTIAL/PLATELET - Abnormal; Notable for the following components:   RBC 6.02 (*)    MCV 76.1 (*)    All other components within normal limits  URINALYSIS, MICROSCOPIC (REFLEX)  BASIC METABOLIC PANEL WITH GFR    EKG: None  Radiology: No results found.   Procedures   Medications Ordered in the ED - No data to display                                  Medical Decision Making Amount and/or Complexity of Data Reviewed Labs: ordered. Radiology: ordered.   Medical Decision Making / ED Course   This patient presents to the ED for concern of low back pain, this involves an extensive number  of treatment options, and is a complaint that carries with it a high risk of complications and morbidity.  The differential diagnosis includes muscle strain, kidney stones, pyelonephritis, UTI, hernia, diverticulitis  MDM: 69 year old male presents today for concern of left flank pain that radiates into his left groin.  No history of kidney stones.  Patient appears hemodynamically stable other than being slightly hypertensive.  He is ambulating well.  Good strength in bilateral lower extremities.  He was seen by urology about a month ago and had a CT scan done because of his microscopic hemoglobin and his urine.  He was treated for a UTI at that time.  At the end of the visit he is requesting STD testing.  Prior to discharge he was also requesting medication for pain control.  Ultimately patient after chart review it seems like his GFR is in the 50s.  We discussed long-term anti-inflammatory medications would not be good for him.  I offered him Toradol  IM in the emergency department but he declined.  Patient is okay without any medications as he requested being discharged.  Lab Tests: -I ordered, reviewed, and interpreted labs.   The pertinent results include:   Labs Reviewed  URINALYSIS, ROUTINE W REFLEX MICROSCOPIC - Abnormal; Notable for the following components:      Result Value   Glucose, UA >=500 (*)    Hgb urine dipstick TRACE (*)    All other components within normal limits  CBC WITH DIFFERENTIAL/PLATELET - Abnormal; Notable for the following components:   RBC 6.02 (*)    MCV 76.1 (*)    All other components within normal limits  BASIC METABOLIC PANEL WITH GFR - Abnormal; Notable for the following components:   Glucose, Bld 193 (*)    BUN 29 (*)    Creatinine, Ser 1.31 (*)    GFR, Estimated 60 (*)    All other components within normal limits  URINALYSIS, MICROSCOPIC (REFLEX)      EKG  EKG Interpretation Date/Time:    Ventricular Rate:    PR Interval:    QRS  Duration:    QT Interval:    QTC Calculation:   R Axis:      Text Interpretation:           Imaging Studies ordered: I ordered imaging studies including CT renal stone study I independently visualized and interpreted imaging. I agree with the radiologist interpretation   Medicines ordered and prescription drug management: No orders of the defined types were placed in this encounter.   -I have reviewed the patients home medicines and have made adjustments as needed  Reevaluation: After the interventions noted above,  I reevaluated the patient and found that they have :improved  Co morbidities that complicate the patient evaluation  Past Medical History:  Diagnosis Date   Anginal pain    Anxiety    Asthma    Chronic back pain    Depression    Diabetes mellitus without complication (HCC)    Dysrhythmia    irregular heartbeat due to stress   GERD (gastroesophageal reflux disease)    Heart murmur    born with heart murmur   Pneumonia       Dispostion: Discharged in stable condition.  Precaution discussed.  Patient voices understanding and is in agreement with plan.   Final diagnoses:  Strain of lumbar region, initial encounter    ED Discharge Orders     None          Hildegard Loge, PA-C 02/22/24 1745    Ruthe Cornet, DO 02/22/24 1838  "

## 2024-02-22 NOTE — ED Notes (Signed)
 Pt. Does not want to be treated if his bill is going to be too high he want to be seen by an account rep. About his bill first.

## 2024-03-27 ENCOUNTER — Ambulatory Visit: Admitting: Podiatry

## 2024-07-23 ENCOUNTER — Ambulatory Visit: Admitting: Urology
